# Patient Record
Sex: Male | Born: 1950
Health system: Southern US, Community
[De-identification: ages and names within clinical notes are randomized; demographics above are authoritative.]

## PROBLEM LIST (undated history)

## (undated) DIAGNOSIS — R351 Nocturia: Secondary | ICD-10-CM

## (undated) DIAGNOSIS — N183 Chronic kidney disease, stage 3 unspecified: Secondary | ICD-10-CM

## (undated) DIAGNOSIS — H8110 Benign paroxysmal vertigo, unspecified ear: Secondary | ICD-10-CM

## (undated) DIAGNOSIS — M1990 Unspecified osteoarthritis, unspecified site: Secondary | ICD-10-CM

## (undated) DIAGNOSIS — Z973 Presence of spectacles and contact lenses: Secondary | ICD-10-CM

## (undated) DIAGNOSIS — E559 Vitamin D deficiency, unspecified: Secondary | ICD-10-CM

## (undated) DIAGNOSIS — G4733 Obstructive sleep apnea (adult) (pediatric): Secondary | ICD-10-CM

## (undated) DIAGNOSIS — F329 Major depressive disorder, single episode, unspecified: Secondary | ICD-10-CM

## (undated) DIAGNOSIS — I10 Essential (primary) hypertension: Secondary | ICD-10-CM

## (undated) DIAGNOSIS — C801 Malignant (primary) neoplasm, unspecified: Secondary | ICD-10-CM

## (undated) DIAGNOSIS — K269 Duodenal ulcer, unspecified as acute or chronic, without hemorrhage or perforation: Secondary | ICD-10-CM

## (undated) DIAGNOSIS — K219 Gastro-esophageal reflux disease without esophagitis: Secondary | ICD-10-CM

## (undated) DIAGNOSIS — I209 Angina pectoris, unspecified: Secondary | ICD-10-CM

## (undated) DIAGNOSIS — N189 Chronic kidney disease, unspecified: Secondary | ICD-10-CM

## (undated) DIAGNOSIS — E785 Hyperlipidemia, unspecified: Secondary | ICD-10-CM

## (undated) DIAGNOSIS — C679 Malignant neoplasm of bladder, unspecified: Secondary | ICD-10-CM

## (undated) DIAGNOSIS — E1165 Type 2 diabetes mellitus with hyperglycemia: Secondary | ICD-10-CM

## (undated) DIAGNOSIS — Z87442 Personal history of urinary calculi: Secondary | ICD-10-CM

## (undated) DIAGNOSIS — E7800 Pure hypercholesterolemia, unspecified: Secondary | ICD-10-CM

## (undated) DIAGNOSIS — K449 Diaphragmatic hernia without obstruction or gangrene: Secondary | ICD-10-CM

## (undated) DIAGNOSIS — E119 Type 2 diabetes mellitus without complications: Secondary | ICD-10-CM

## (undated) HISTORY — PX: KNEE ARTHROPLASTY: SHX992

## (undated) HISTORY — DX: Gastro-esophageal reflux disease without esophagitis: K21.9

## (undated) HISTORY — DX: Obstructive sleep apnea (adult) (pediatric): G47.33

## (undated) HISTORY — PX: UPPER GI ENDOSCOPY: SHX6162

## (undated) HISTORY — DX: Type 2 diabetes mellitus with hyperglycemia: E11.65

## (undated) HISTORY — DX: Vitamin D deficiency, unspecified: E55.9

## (undated) HISTORY — DX: Diaphragmatic hernia without obstruction or gangrene: K44.9

## (undated) HISTORY — PX: COLONOSCOPY: SHX174

## (undated) HISTORY — PX: TONSILLECTOMY: SUR1361

## (undated) HISTORY — DX: Duodenal ulcer, unspecified as acute or chronic, without hemorrhage or perforation: K26.9

## (undated) HISTORY — PX: LITHOTRIPSY: SUR834

## (undated) HISTORY — PX: CERVICAL FUSION: SHX112

## (undated) HISTORY — PX: OTHER SURGICAL HISTORY: SHX169

## (undated) HISTORY — PX: BLADDER TUMOR EXCISION: SHX238

## (undated) HISTORY — DX: Benign paroxysmal vertigo, unspecified ear: H81.10

---
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
## 1983-01-11 HISTORY — PX: KNEE ARTHROSCOPY: SUR90

## 2013-09-02 DIAGNOSIS — Z6834 Body mass index (BMI) 34.0-34.9, adult: Secondary | ICD-10-CM

## 2013-09-02 HISTORY — DX: Body mass index (BMI) 34.0-34.9, adult: Z68.34

## 2014-01-10 HISTORY — PX: TOTAL KNEE ARTHROPLASTY: SHX125

## 2014-10-22 DIAGNOSIS — Z79891 Long term (current) use of opiate analgesic: Secondary | ICD-10-CM | POA: Diagnosis not present

## 2014-10-22 DIAGNOSIS — M47812 Spondylosis without myelopathy or radiculopathy, cervical region: Secondary | ICD-10-CM | POA: Diagnosis not present

## 2014-10-22 DIAGNOSIS — F1120 Opioid dependence, uncomplicated: Secondary | ICD-10-CM | POA: Diagnosis not present

## 2014-10-22 DIAGNOSIS — G8929 Other chronic pain: Secondary | ICD-10-CM | POA: Diagnosis not present

## 2014-11-19 DIAGNOSIS — Z79891 Long term (current) use of opiate analgesic: Secondary | ICD-10-CM | POA: Diagnosis not present

## 2014-11-19 DIAGNOSIS — G8929 Other chronic pain: Secondary | ICD-10-CM | POA: Diagnosis not present

## 2014-12-08 DIAGNOSIS — Z1389 Encounter for screening for other disorder: Secondary | ICD-10-CM | POA: Diagnosis not present

## 2014-12-08 DIAGNOSIS — I10 Essential (primary) hypertension: Secondary | ICD-10-CM | POA: Diagnosis not present

## 2014-12-08 DIAGNOSIS — E559 Vitamin D deficiency, unspecified: Secondary | ICD-10-CM | POA: Diagnosis not present

## 2014-12-08 DIAGNOSIS — E1165 Type 2 diabetes mellitus with hyperglycemia: Secondary | ICD-10-CM | POA: Diagnosis not present

## 2014-12-18 DIAGNOSIS — Z79891 Long term (current) use of opiate analgesic: Secondary | ICD-10-CM | POA: Diagnosis not present

## 2014-12-18 DIAGNOSIS — I10 Essential (primary) hypertension: Secondary | ICD-10-CM | POA: Diagnosis not present

## 2014-12-18 DIAGNOSIS — G8929 Other chronic pain: Secondary | ICD-10-CM | POA: Diagnosis not present

## 2014-12-18 DIAGNOSIS — M47812 Spondylosis without myelopathy or radiculopathy, cervical region: Secondary | ICD-10-CM | POA: Diagnosis not present

## 2015-02-04 DIAGNOSIS — E119 Type 2 diabetes mellitus without complications: Secondary | ICD-10-CM | POA: Diagnosis not present

## 2015-02-04 DIAGNOSIS — M179 Osteoarthritis of knee, unspecified: Secondary | ICD-10-CM | POA: Diagnosis not present

## 2015-02-04 DIAGNOSIS — I10 Essential (primary) hypertension: Secondary | ICD-10-CM | POA: Diagnosis not present

## 2015-04-06 DIAGNOSIS — F411 Generalized anxiety disorder: Secondary | ICD-10-CM | POA: Insufficient documentation

## 2015-04-06 DIAGNOSIS — F330 Major depressive disorder, recurrent, mild: Secondary | ICD-10-CM | POA: Insufficient documentation

## 2015-04-06 HISTORY — DX: Generalized anxiety disorder: F41.1

## 2015-04-06 HISTORY — DX: Major depressive disorder, recurrent, mild: F33.0

## 2015-06-05 DIAGNOSIS — A932 Colorado tick fever: Secondary | ICD-10-CM | POA: Diagnosis not present

## 2015-06-05 DIAGNOSIS — E1165 Type 2 diabetes mellitus with hyperglycemia: Secondary | ICD-10-CM | POA: Diagnosis not present

## 2015-07-07 DIAGNOSIS — E1165 Type 2 diabetes mellitus with hyperglycemia: Secondary | ICD-10-CM | POA: Diagnosis not present

## 2015-07-15 DIAGNOSIS — E1165 Type 2 diabetes mellitus with hyperglycemia: Secondary | ICD-10-CM | POA: Diagnosis not present

## 2015-07-15 DIAGNOSIS — Z0000 Encounter for general adult medical examination without abnormal findings: Secondary | ICD-10-CM | POA: Diagnosis not present

## 2015-09-09 DIAGNOSIS — M546 Pain in thoracic spine: Secondary | ICD-10-CM | POA: Diagnosis not present

## 2015-09-09 DIAGNOSIS — E559 Vitamin D deficiency, unspecified: Secondary | ICD-10-CM | POA: Diagnosis not present

## 2015-09-09 DIAGNOSIS — I10 Essential (primary) hypertension: Secondary | ICD-10-CM | POA: Diagnosis not present

## 2015-09-09 DIAGNOSIS — M5412 Radiculopathy, cervical region: Secondary | ICD-10-CM | POA: Diagnosis not present

## 2015-09-09 DIAGNOSIS — G4700 Insomnia, unspecified: Secondary | ICD-10-CM | POA: Diagnosis not present

## 2015-09-09 DIAGNOSIS — M542 Cervicalgia: Secondary | ICD-10-CM | POA: Diagnosis not present

## 2015-09-09 DIAGNOSIS — E7800 Pure hypercholesterolemia, unspecified: Secondary | ICD-10-CM | POA: Diagnosis not present

## 2015-09-09 DIAGNOSIS — E1165 Type 2 diabetes mellitus with hyperglycemia: Secondary | ICD-10-CM | POA: Diagnosis not present

## 2015-09-17 DIAGNOSIS — S61232A Puncture wound without foreign body of right middle finger without damage to nail, initial encounter: Secondary | ICD-10-CM | POA: Diagnosis not present

## 2015-10-15 DIAGNOSIS — M5412 Radiculopathy, cervical region: Secondary | ICD-10-CM | POA: Diagnosis not present

## 2015-10-19 DIAGNOSIS — Z125 Encounter for screening for malignant neoplasm of prostate: Secondary | ICD-10-CM | POA: Diagnosis not present

## 2015-10-19 DIAGNOSIS — E1165 Type 2 diabetes mellitus with hyperglycemia: Secondary | ICD-10-CM | POA: Diagnosis not present

## 2015-10-20 ENCOUNTER — Other Ambulatory Visit: Payer: Self-pay | Admitting: Neurosurgery

## 2015-10-20 DIAGNOSIS — M4802 Spinal stenosis, cervical region: Secondary | ICD-10-CM | POA: Diagnosis not present

## 2015-10-20 DIAGNOSIS — G5603 Carpal tunnel syndrome, bilateral upper limbs: Secondary | ICD-10-CM | POA: Diagnosis not present

## 2015-10-20 DIAGNOSIS — M5412 Radiculopathy, cervical region: Secondary | ICD-10-CM

## 2015-11-03 DIAGNOSIS — I10 Essential (primary) hypertension: Secondary | ICD-10-CM | POA: Diagnosis not present

## 2015-11-03 DIAGNOSIS — E782 Mixed hyperlipidemia: Secondary | ICD-10-CM | POA: Diagnosis not present

## 2015-11-03 DIAGNOSIS — G4700 Insomnia, unspecified: Secondary | ICD-10-CM | POA: Diagnosis not present

## 2015-11-03 DIAGNOSIS — E559 Vitamin D deficiency, unspecified: Secondary | ICD-10-CM | POA: Diagnosis not present

## 2015-11-03 DIAGNOSIS — E1165 Type 2 diabetes mellitus with hyperglycemia: Secondary | ICD-10-CM | POA: Diagnosis not present

## 2015-11-04 ENCOUNTER — Ambulatory Visit
Admission: RE | Admit: 2015-11-04 | Discharge: 2015-11-04 | Disposition: A | Discharge status: Home / Self Care | Payer: Medicare Other | Source: Ambulatory Visit | Attending: Neurosurgery | Admitting: Neurosurgery

## 2015-11-04 DIAGNOSIS — M4802 Spinal stenosis, cervical region: Secondary | ICD-10-CM | POA: Diagnosis not present

## 2015-11-04 DIAGNOSIS — M5412 Radiculopathy, cervical region: Secondary | ICD-10-CM

## 2015-11-04 MED ORDER — ONDANSETRON HCL 4 MG/2ML IJ SOLN: 4.0000 mg | Freq: Four times a day (QID) | INTRAMUSCULAR | Status: DC | PRN

## 2015-11-04 MED ORDER — IOPAMIDOL (ISOVUE-M 300) INJECTION 61%
10.0000 mL | Freq: Once | INTRAMUSCULAR | Status: AC | PRN
  Administered 2015-11-04: 10 mL via INTRATHECAL

## 2015-11-04 MED ORDER — DIAZEPAM 5 MG PO TABS
10.0000 mg | ORAL_TABLET | Freq: Once | ORAL | Status: AC
  Administered 2015-11-04: 10 mg via ORAL

## 2015-11-04 NOTE — Discharge Instructions (Signed)

## 2015-11-06 DIAGNOSIS — M5412 Radiculopathy, cervical region: Secondary | ICD-10-CM | POA: Diagnosis not present

## 2015-11-11 ENCOUNTER — Other Ambulatory Visit: Payer: Self-pay | Admitting: Neurosurgery

## 2015-11-11 DIAGNOSIS — M5412 Radiculopathy, cervical region: Secondary | ICD-10-CM

## 2015-11-18 ENCOUNTER — Other Ambulatory Visit: Payer: Medicare Other

## 2015-11-19 ENCOUNTER — Ambulatory Visit
Admission: RE | Admit: 2015-11-19 | Discharge: 2015-11-19 | Disposition: A | Discharge status: Home / Self Care | Payer: Medicare Other | Source: Ambulatory Visit | Attending: Neurosurgery | Admitting: Neurosurgery

## 2015-11-19 DIAGNOSIS — M5412 Radiculopathy, cervical region: Secondary | ICD-10-CM | POA: Diagnosis not present

## 2015-11-19 MED ORDER — DEXAMETHASONE SODIUM PHOSPHATE 4 MG/ML IJ SOLN
4.0000 mg | Freq: Once | INTRAMUSCULAR | Status: AC
  Administered 2015-11-19: 4 mg via INTRA_ARTICULAR

## 2015-11-19 MED ORDER — IOPAMIDOL (ISOVUE-M 300) INJECTION 61%
1.0000 mL | Freq: Once | INTRAMUSCULAR | Status: AC | PRN
  Administered 2015-11-19: 1 mL via INTRA_ARTICULAR

## 2015-11-19 NOTE — Discharge Instructions (Signed)

## 2015-12-07 DIAGNOSIS — M4802 Spinal stenosis, cervical region: Secondary | ICD-10-CM | POA: Diagnosis not present

## 2015-12-07 DIAGNOSIS — I10 Essential (primary) hypertension: Secondary | ICD-10-CM | POA: Diagnosis not present

## 2015-12-09 ENCOUNTER — Other Ambulatory Visit: Payer: Self-pay | Admitting: Neurosurgery

## 2015-12-09 DIAGNOSIS — M4802 Spinal stenosis, cervical region: Secondary | ICD-10-CM

## 2016-01-11 HISTORY — PX: COLONOSCOPY: SHX174

## 2016-02-22 DIAGNOSIS — M4722 Other spondylosis with radiculopathy, cervical region: Secondary | ICD-10-CM | POA: Diagnosis not present

## 2016-02-22 DIAGNOSIS — I10 Essential (primary) hypertension: Secondary | ICD-10-CM | POA: Diagnosis not present

## 2016-02-22 DIAGNOSIS — S129XXA Fracture of neck, unspecified, initial encounter: Secondary | ICD-10-CM | POA: Diagnosis not present

## 2016-04-13 DIAGNOSIS — H8113 Benign paroxysmal vertigo, bilateral: Secondary | ICD-10-CM | POA: Diagnosis not present

## 2016-04-13 DIAGNOSIS — R110 Nausea: Secondary | ICD-10-CM | POA: Diagnosis not present

## 2016-04-13 DIAGNOSIS — E1165 Type 2 diabetes mellitus with hyperglycemia: Secondary | ICD-10-CM | POA: Diagnosis not present

## 2016-04-18 DIAGNOSIS — H8113 Benign paroxysmal vertigo, bilateral: Secondary | ICD-10-CM | POA: Diagnosis not present

## 2016-04-22 DIAGNOSIS — Z0000 Encounter for general adult medical examination without abnormal findings: Secondary | ICD-10-CM | POA: Diagnosis not present

## 2016-06-14 DIAGNOSIS — S129XXD Fracture of neck, unspecified, subsequent encounter: Secondary | ICD-10-CM | POA: Diagnosis not present

## 2016-06-14 DIAGNOSIS — M4722 Other spondylosis with radiculopathy, cervical region: Secondary | ICD-10-CM | POA: Diagnosis not present

## 2016-06-14 DIAGNOSIS — I10 Essential (primary) hypertension: Secondary | ICD-10-CM | POA: Diagnosis not present

## 2016-07-18 DIAGNOSIS — M5030 Other cervical disc degeneration, unspecified cervical region: Secondary | ICD-10-CM | POA: Diagnosis not present

## 2016-08-09 DIAGNOSIS — I10 Essential (primary) hypertension: Secondary | ICD-10-CM | POA: Diagnosis not present

## 2016-08-09 DIAGNOSIS — E559 Vitamin D deficiency, unspecified: Secondary | ICD-10-CM | POA: Diagnosis not present

## 2016-08-09 DIAGNOSIS — M5030 Other cervical disc degeneration, unspecified cervical region: Secondary | ICD-10-CM | POA: Diagnosis not present

## 2016-10-13 DIAGNOSIS — Z5181 Encounter for therapeutic drug level monitoring: Secondary | ICD-10-CM

## 2016-10-13 DIAGNOSIS — Z79899 Other long term (current) drug therapy: Secondary | ICD-10-CM

## 2016-10-13 DIAGNOSIS — M5416 Radiculopathy, lumbar region: Secondary | ICD-10-CM | POA: Diagnosis not present

## 2016-10-13 DIAGNOSIS — G894 Chronic pain syndrome: Secondary | ICD-10-CM | POA: Insufficient documentation

## 2016-10-13 DIAGNOSIS — M5481 Occipital neuralgia: Secondary | ICD-10-CM

## 2016-10-13 DIAGNOSIS — M4802 Spinal stenosis, cervical region: Secondary | ICD-10-CM

## 2016-10-13 DIAGNOSIS — M1710 Unilateral primary osteoarthritis, unspecified knee: Secondary | ICD-10-CM

## 2016-10-13 DIAGNOSIS — M7061 Trochanteric bursitis, right hip: Secondary | ICD-10-CM

## 2016-10-13 DIAGNOSIS — Z96651 Presence of right artificial knee joint: Secondary | ICD-10-CM

## 2016-10-13 DIAGNOSIS — M961 Postlaminectomy syndrome, not elsewhere classified: Secondary | ICD-10-CM | POA: Insufficient documentation

## 2016-10-13 DIAGNOSIS — M1711 Unilateral primary osteoarthritis, right knee: Secondary | ICD-10-CM | POA: Insufficient documentation

## 2016-10-13 HISTORY — DX: Unilateral primary osteoarthritis, right knee: M17.11

## 2016-10-13 HISTORY — DX: Postlaminectomy syndrome, not elsewhere classified: M96.1

## 2016-10-13 HISTORY — DX: Chronic pain syndrome: G89.4

## 2016-10-13 HISTORY — DX: Encounter for therapeutic drug level monitoring: Z51.81

## 2016-10-13 HISTORY — DX: Other long term (current) drug therapy: Z79.899

## 2016-10-13 HISTORY — DX: Trochanteric bursitis, right hip: M70.61

## 2016-10-13 HISTORY — DX: Unilateral primary osteoarthritis, unspecified knee: M17.10

## 2016-10-13 HISTORY — DX: Presence of right artificial knee joint: Z96.651

## 2016-10-13 HISTORY — DX: Occipital neuralgia: M54.81

## 2016-10-13 HISTORY — DX: Spinal stenosis, cervical region: M48.02

## 2016-11-04 DIAGNOSIS — E1165 Type 2 diabetes mellitus with hyperglycemia: Secondary | ICD-10-CM | POA: Diagnosis not present

## 2016-11-04 DIAGNOSIS — D510 Vitamin B12 deficiency anemia due to intrinsic factor deficiency: Secondary | ICD-10-CM | POA: Diagnosis not present

## 2016-11-04 DIAGNOSIS — E782 Mixed hyperlipidemia: Secondary | ICD-10-CM | POA: Diagnosis not present

## 2016-11-04 DIAGNOSIS — Z79899 Other long term (current) drug therapy: Secondary | ICD-10-CM | POA: Diagnosis not present

## 2016-11-04 DIAGNOSIS — Z1159 Encounter for screening for other viral diseases: Secondary | ICD-10-CM | POA: Diagnosis not present

## 2016-11-04 DIAGNOSIS — E559 Vitamin D deficiency, unspecified: Secondary | ICD-10-CM | POA: Diagnosis not present

## 2016-11-04 DIAGNOSIS — M5030 Other cervical disc degeneration, unspecified cervical region: Secondary | ICD-10-CM | POA: Diagnosis not present

## 2016-11-04 DIAGNOSIS — I10 Essential (primary) hypertension: Secondary | ICD-10-CM | POA: Diagnosis not present

## 2016-11-15 DIAGNOSIS — M961 Postlaminectomy syndrome, not elsewhere classified: Secondary | ICD-10-CM | POA: Diagnosis not present

## 2016-11-15 DIAGNOSIS — M5481 Occipital neuralgia: Secondary | ICD-10-CM | POA: Diagnosis not present

## 2016-11-15 DIAGNOSIS — M4802 Spinal stenosis, cervical region: Secondary | ICD-10-CM | POA: Diagnosis not present

## 2016-11-16 DIAGNOSIS — M5481 Occipital neuralgia: Secondary | ICD-10-CM | POA: Diagnosis not present

## 2016-12-14 DIAGNOSIS — M47812 Spondylosis without myelopathy or radiculopathy, cervical region: Secondary | ICD-10-CM | POA: Insufficient documentation

## 2016-12-14 DIAGNOSIS — M961 Postlaminectomy syndrome, not elsewhere classified: Secondary | ICD-10-CM | POA: Diagnosis not present

## 2016-12-14 DIAGNOSIS — M4802 Spinal stenosis, cervical region: Secondary | ICD-10-CM | POA: Diagnosis not present

## 2016-12-14 DIAGNOSIS — M5481 Occipital neuralgia: Secondary | ICD-10-CM | POA: Diagnosis not present

## 2016-12-14 HISTORY — DX: Spondylosis without myelopathy or radiculopathy, cervical region: M47.812

## 2016-12-15 DIAGNOSIS — R110 Nausea: Secondary | ICD-10-CM | POA: Diagnosis not present

## 2016-12-15 DIAGNOSIS — Z1339 Encounter for screening examination for other mental health and behavioral disorders: Secondary | ICD-10-CM | POA: Diagnosis not present

## 2016-12-15 DIAGNOSIS — E782 Mixed hyperlipidemia: Secondary | ICD-10-CM | POA: Diagnosis not present

## 2016-12-15 DIAGNOSIS — I10 Essential (primary) hypertension: Secondary | ICD-10-CM | POA: Diagnosis not present

## 2016-12-15 DIAGNOSIS — K219 Gastro-esophageal reflux disease without esophagitis: Secondary | ICD-10-CM | POA: Diagnosis not present

## 2016-12-28 DIAGNOSIS — R1310 Dysphagia, unspecified: Secondary | ICD-10-CM | POA: Diagnosis not present

## 2017-01-30 DIAGNOSIS — R5383 Other fatigue: Secondary | ICD-10-CM | POA: Diagnosis not present

## 2017-01-30 DIAGNOSIS — G4733 Obstructive sleep apnea (adult) (pediatric): Secondary | ICD-10-CM | POA: Diagnosis not present

## 2017-02-07 DIAGNOSIS — S129XXS Fracture of neck, unspecified, sequela: Secondary | ICD-10-CM

## 2017-02-07 DIAGNOSIS — M5031 Other cervical disc degeneration,  high cervical region: Secondary | ICD-10-CM | POA: Diagnosis not present

## 2017-02-07 DIAGNOSIS — M4802 Spinal stenosis, cervical region: Secondary | ICD-10-CM | POA: Diagnosis not present

## 2017-02-07 HISTORY — DX: Fracture of neck, unspecified, sequela: S12.9XXS

## 2017-02-08 DIAGNOSIS — M5481 Occipital neuralgia: Secondary | ICD-10-CM | POA: Diagnosis not present

## 2017-02-08 DIAGNOSIS — G894 Chronic pain syndrome: Secondary | ICD-10-CM | POA: Diagnosis not present

## 2017-02-08 DIAGNOSIS — M4802 Spinal stenosis, cervical region: Secondary | ICD-10-CM | POA: Diagnosis not present

## 2017-02-08 DIAGNOSIS — M961 Postlaminectomy syndrome, not elsewhere classified: Secondary | ICD-10-CM | POA: Diagnosis not present

## 2017-02-21 DIAGNOSIS — G4733 Obstructive sleep apnea (adult) (pediatric): Secondary | ICD-10-CM | POA: Diagnosis not present

## 2017-02-27 DIAGNOSIS — G4733 Obstructive sleep apnea (adult) (pediatric): Secondary | ICD-10-CM | POA: Diagnosis not present

## 2017-02-27 DIAGNOSIS — Z87891 Personal history of nicotine dependence: Secondary | ICD-10-CM | POA: Diagnosis not present

## 2017-03-28 DIAGNOSIS — G4733 Obstructive sleep apnea (adult) (pediatric): Secondary | ICD-10-CM | POA: Diagnosis not present

## 2017-04-03 DIAGNOSIS — R5383 Other fatigue: Secondary | ICD-10-CM | POA: Diagnosis not present

## 2017-04-03 DIAGNOSIS — G4733 Obstructive sleep apnea (adult) (pediatric): Secondary | ICD-10-CM | POA: Diagnosis not present

## 2017-04-03 DIAGNOSIS — J0100 Acute maxillary sinusitis, unspecified: Secondary | ICD-10-CM | POA: Diagnosis not present

## 2017-04-03 DIAGNOSIS — R05 Cough: Secondary | ICD-10-CM | POA: Diagnosis not present

## 2017-04-12 DIAGNOSIS — M5481 Occipital neuralgia: Secondary | ICD-10-CM | POA: Diagnosis not present

## 2017-04-12 DIAGNOSIS — M4802 Spinal stenosis, cervical region: Secondary | ICD-10-CM | POA: Diagnosis not present

## 2017-04-12 DIAGNOSIS — M961 Postlaminectomy syndrome, not elsewhere classified: Secondary | ICD-10-CM | POA: Diagnosis not present

## 2017-04-12 DIAGNOSIS — G894 Chronic pain syndrome: Secondary | ICD-10-CM | POA: Diagnosis not present

## 2017-04-12 DIAGNOSIS — Z79891 Long term (current) use of opiate analgesic: Secondary | ICD-10-CM | POA: Diagnosis not present

## 2017-04-13 DIAGNOSIS — G4733 Obstructive sleep apnea (adult) (pediatric): Secondary | ICD-10-CM | POA: Diagnosis not present

## 2017-04-24 DIAGNOSIS — S9001XD Contusion of right ankle, subsequent encounter: Secondary | ICD-10-CM | POA: Diagnosis not present

## 2017-05-01 DIAGNOSIS — E559 Vitamin D deficiency, unspecified: Secondary | ICD-10-CM | POA: Diagnosis not present

## 2017-05-01 DIAGNOSIS — E1165 Type 2 diabetes mellitus with hyperglycemia: Secondary | ICD-10-CM | POA: Diagnosis not present

## 2017-05-04 DIAGNOSIS — Z0000 Encounter for general adult medical examination without abnormal findings: Secondary | ICD-10-CM | POA: Diagnosis not present

## 2017-05-04 DIAGNOSIS — E782 Mixed hyperlipidemia: Secondary | ICD-10-CM | POA: Diagnosis not present

## 2017-05-04 DIAGNOSIS — I10 Essential (primary) hypertension: Secondary | ICD-10-CM | POA: Diagnosis not present

## 2017-05-04 DIAGNOSIS — E1165 Type 2 diabetes mellitus with hyperglycemia: Secondary | ICD-10-CM | POA: Diagnosis not present

## 2017-05-04 DIAGNOSIS — K219 Gastro-esophageal reflux disease without esophagitis: Secondary | ICD-10-CM | POA: Diagnosis not present

## 2017-05-08 DIAGNOSIS — G4733 Obstructive sleep apnea (adult) (pediatric): Secondary | ICD-10-CM | POA: Diagnosis not present

## 2017-05-13 DIAGNOSIS — G4733 Obstructive sleep apnea (adult) (pediatric): Secondary | ICD-10-CM | POA: Diagnosis not present

## 2017-05-17 DIAGNOSIS — M545 Low back pain: Secondary | ICD-10-CM | POA: Diagnosis not present

## 2017-05-17 DIAGNOSIS — R0781 Pleurodynia: Secondary | ICD-10-CM | POA: Diagnosis not present

## 2017-05-18 DIAGNOSIS — M5481 Occipital neuralgia: Secondary | ICD-10-CM | POA: Diagnosis not present

## 2017-05-18 DIAGNOSIS — M961 Postlaminectomy syndrome, not elsewhere classified: Secondary | ICD-10-CM | POA: Diagnosis not present

## 2017-05-18 DIAGNOSIS — G894 Chronic pain syndrome: Secondary | ICD-10-CM | POA: Diagnosis not present

## 2017-05-29 IMAGING — CT CT CERVICAL SPINE W/ CM
3 of 4 series · 11 of 33 positions shown, 13 images · non-contrast
Comparison: Cervical spine radiographs 09/09/2015

CLINICAL DATA: Neck pain. Shoulder pain, predominantly on the left.
Prior cervical fusion.
TECHNIQUE: Contiguous axial images were obtained through the Cervical spine
after the intrathecal infusion of infusion. Coronal and sagittal
reconstructions were obtained of the axial image sets.

[Series 6: cor · coronal · 0.22mm/px · 3 of 58 slices shown]
[im 12/58  bone]
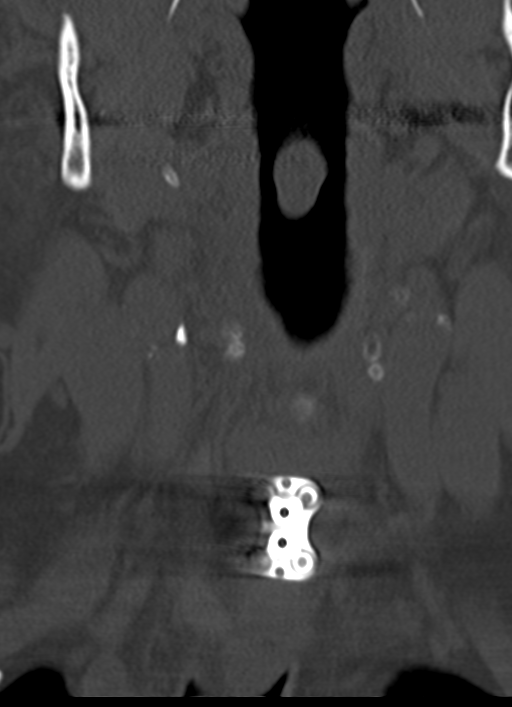
[im 23/58  bone]
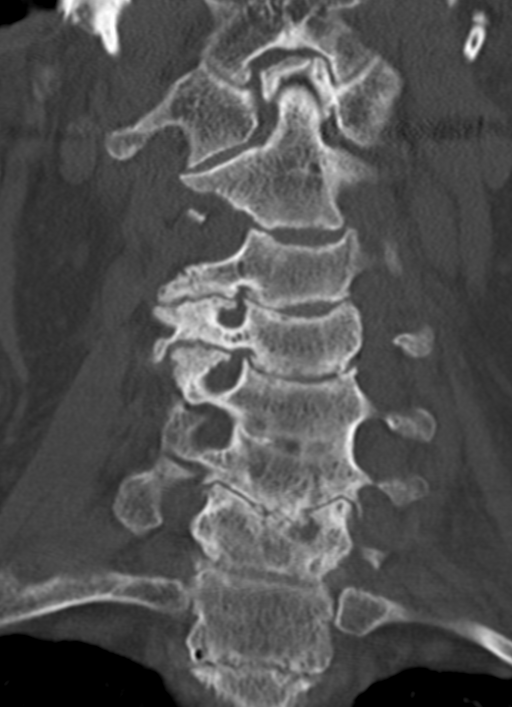
[im 35/58  bone]
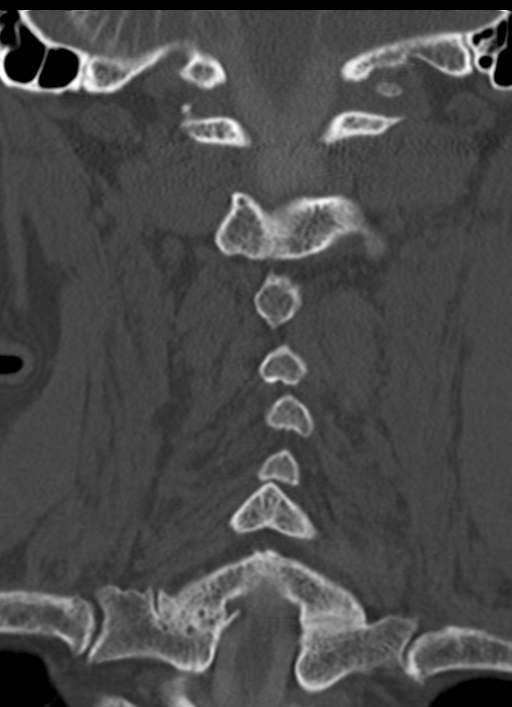

[Series 7: sag · sagittal · 0.23mm/px · 5 of 61 slices shown, 6 images]
[im 21/61  bone]
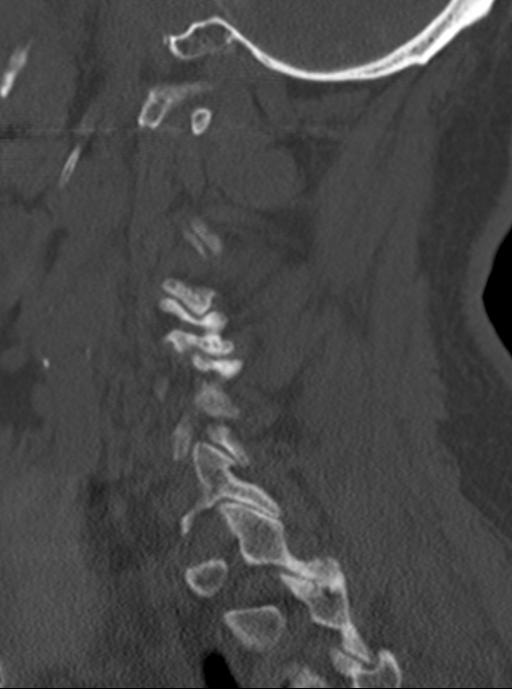
[im 26/61  bone]
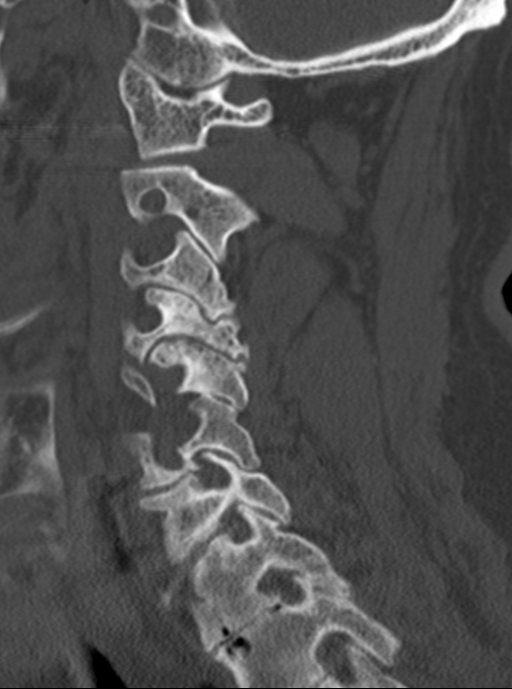
[im 31/61  soft-tissue]
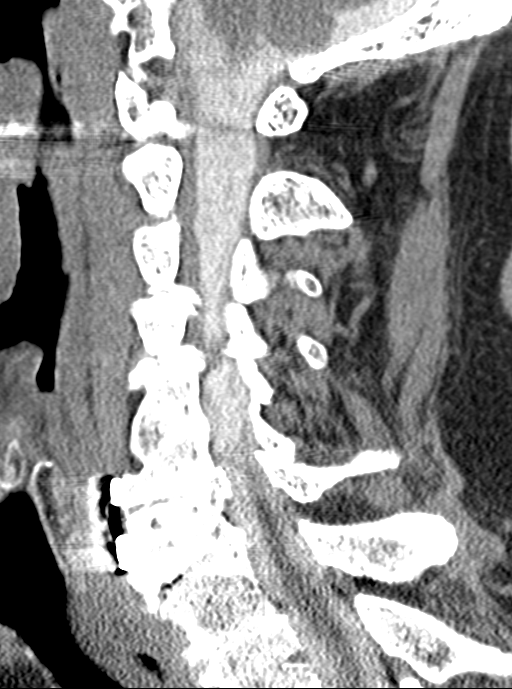
[im 31/61  bone]
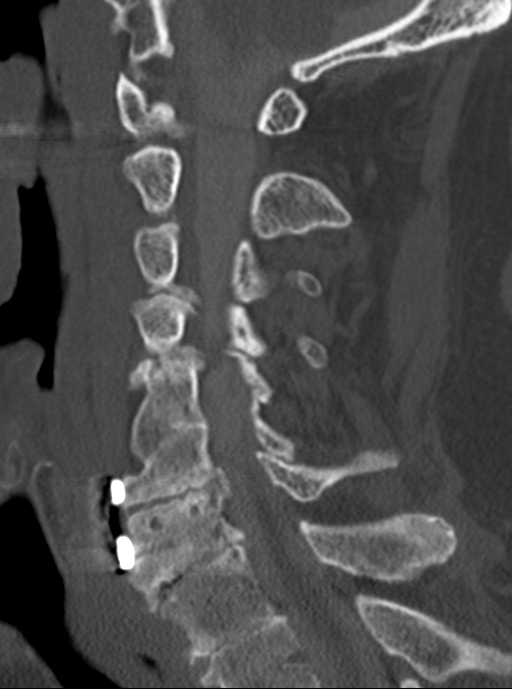
[im 36/61  bone]
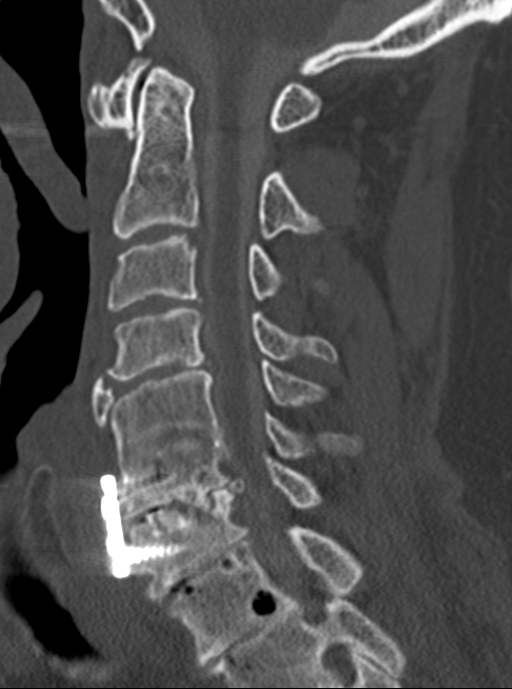
[im 41/61  bone]
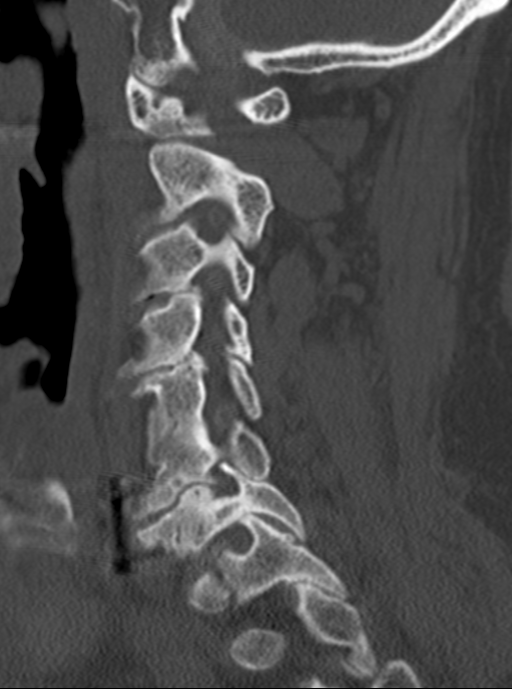

[Series 8: angled axial · axial · 0.23mm/px · z∈[-530,-422]mm · 3 of 82 slices shown, 4 images]
[im 14/82  soft-tissue]
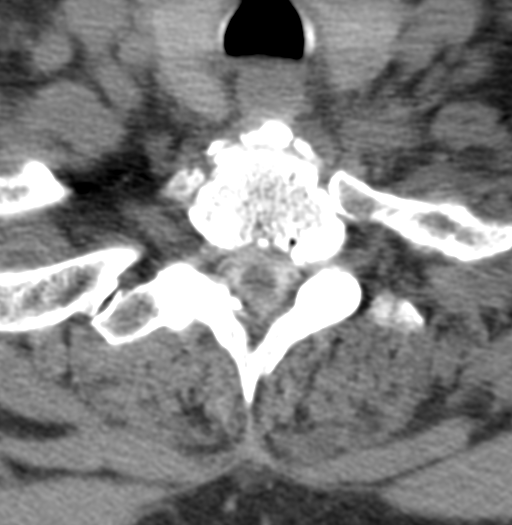
[im 14/82  bone]
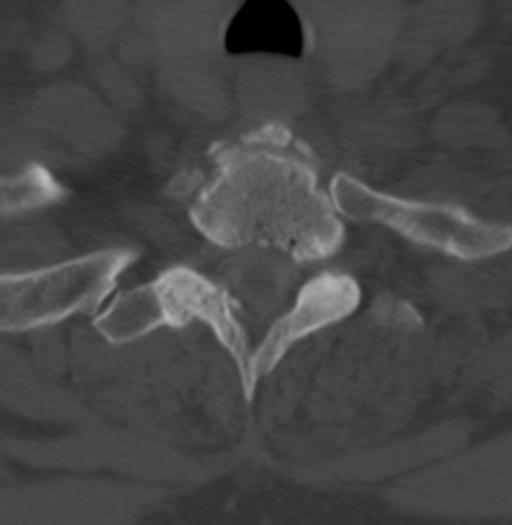
[im 41/82  bone]
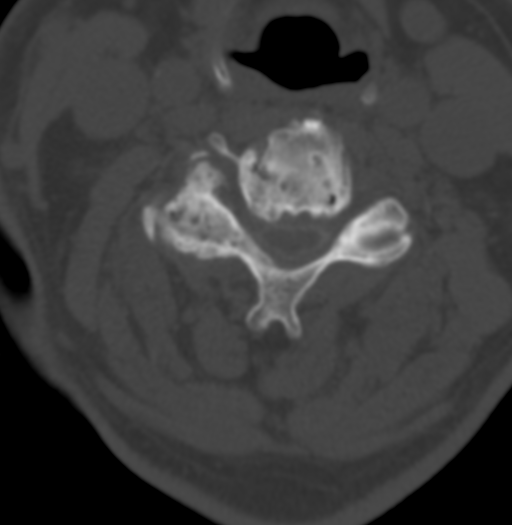
[im 68/82  bone]
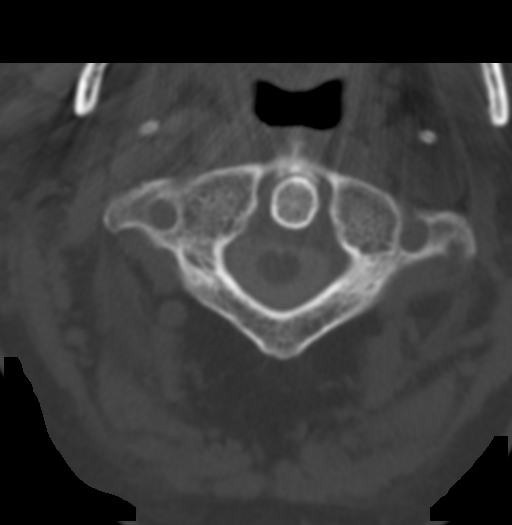

[11 of 33 positions shown; findings below may reference images not displayed]

FLUOROSCOPY TIME:  Radiation Exposure Index (as provided by the
fluoroscopic device): 205.40 microGray*m^2

Fluoroscopy Time (in minutes and seconds):  1 minute 16 seconds

PROCEDURE:
LUMBAR PUNCTURE FOR CERVICAL MYELOGRAM

After thorough discussion of risks and benefits of the procedure
including bleeding, infection, injury to nerves, blood vessels,
adjacent structures as well as headache and CSF leak, written and
oral informed consent was obtained. Consent was obtained by Dr.
Tuong Mcclellan. We discussed the high likelihood of obtaining a
diagnostic study.

Patient was positioned prone on the fluoroscopy table. Local
anesthesia was provided with 1% lidocaine without epinephrine after
prepped and draped in the usual sterile fashion. Puncture was
performed at L3-4 using a 3 1/2 inch 22-gauge spinal needle via a
left paramedian approach. Using a single pass through the dura, the
needle was placed within the thecal sac, with return of clear CSF.
10 mL of Isovue M 300 was injected into the thecal sac, with normal
opacification of the nerve roots and cauda equina consistent with
free flow within the subarachnoid space. The patient was then moved
to the trendelenburg position and contrast flowed into the Cervical
spine region.

I personally performed the lumbar puncture and administered the
intrathecal contrast. I also personally supervised acquisition of
the myelogram images.
FINDINGS: CERVICAL MYELOGRAM FINDINGS:

There is a 3 mm retrolisthesis of C2 on C3 in the upright neutral
position which completely reduces with flexion and at most minimally
increases with extension. Solid C5-6 intervertebral osseous fusion.
Prior C6-7 ACDF with anterior plate and screws in place. Intrathecal
contrast is too faint on the upright images to evaluate for
stenosis. On prone images, lateral extradural defects affect the
bilateral C5 nerve roots as well as C4 on the right. Ventral
extradural defects result in likely mild spinal stenosis at C3-4 and
mild-to-moderate stenosis at C4-5.

CT CERVICAL MYELOGRAM FINDINGS:

There is trace retrolisthesis of C2 on C3, less conspicuous than on
upright radiographs. There is also trace retrolisthesis of C6 on C7.
Solid intervertebral osseous fusion is present at C5-6. C6-7 ACDF is
noted with anterior plate and screws remaining in place. No osseous
fusion is identified across the C6-7 disc space, and there is
subsidence of the interbody graft into the C7 superior endplate with
vacuum disc phenomenon noted. There is mild disc space narrowing at
C3-4, mild-to-moderate narrowing at C4-5, and severe narrowing at
C7-T1 with mild vacuum disc at each level. Mild bilateral carotid
bifurcation atherosclerosis is noted.

C2-3: Minimal disc bulging and minimal left uncovertebral spurring
without stenosis.

C3-4: Broad-based posterior disc osteophyte complex, asymmetric
right uncovertebral spurring, and severe right facet arthrosis
result in mild spinal stenosis with slight ventral cord flattening
and moderate right and mild left neural foraminal stenosis.

C4-5: Broad-based posterior disc osteophyte complex and severe right
and mild left facet arthrosis result in moderate spinal stenosis
with mild ventral cord flattening and moderate right and
mild-to-moderate left neural foraminal stenosis.

C5-6:  Prior fusion.  No stenosis.

C6-7: Diffuse bulky posterior spurring and uncovertebral hypertrophy
result in moderate asymmetric left-sided spinal stenosis with mild
cord flattening and mild to moderate right and severe left neural
foraminal stenosis. Potential left C7 nerve impingement.

C7-T1: Broad-based posterior disc osteophyte complex results in mild
left neural foraminal stenosis without spinal stenosis.
IMPRESSION: 1. Prior C6-7 ACDF with evidence of pseudarthrosis. Bulky spurring
results in moderate left-sided spinal stenosis and mild-to-moderate
right and severe left neural foraminal stenosis.
2. Moderate spinal stenosis and moderate right and mild-to-moderate
left foraminal stenosis at C4-5.
3. Mild spinal stenosis and moderate right and mild left foraminal
stenosis at C3-4.
4. Severe right facet arthrosis at C3-4 and C4-5.
5. Remote, solid C5-6 interbody fusion without stenosis.
6. Grade 1 retrolisthesis of C2 on C3 which reduces with flexion.

## 2017-05-30 DIAGNOSIS — S8011XD Contusion of right lower leg, subsequent encounter: Secondary | ICD-10-CM | POA: Diagnosis not present

## 2017-05-30 DIAGNOSIS — R600 Localized edema: Secondary | ICD-10-CM | POA: Diagnosis not present

## 2017-05-30 DIAGNOSIS — M7989 Other specified soft tissue disorders: Secondary | ICD-10-CM | POA: Diagnosis not present

## 2017-06-09 DIAGNOSIS — S129XXD Fracture of neck, unspecified, subsequent encounter: Secondary | ICD-10-CM | POA: Diagnosis not present

## 2017-06-13 DIAGNOSIS — G4733 Obstructive sleep apnea (adult) (pediatric): Secondary | ICD-10-CM | POA: Diagnosis not present

## 2017-06-14 DIAGNOSIS — E1165 Type 2 diabetes mellitus with hyperglycemia: Secondary | ICD-10-CM | POA: Diagnosis not present

## 2017-06-14 DIAGNOSIS — G4733 Obstructive sleep apnea (adult) (pediatric): Secondary | ICD-10-CM | POA: Diagnosis not present

## 2017-06-14 DIAGNOSIS — R490 Dysphonia: Secondary | ICD-10-CM | POA: Diagnosis not present

## 2017-06-14 DIAGNOSIS — S8011XD Contusion of right lower leg, subsequent encounter: Secondary | ICD-10-CM | POA: Diagnosis not present

## 2017-06-20 DIAGNOSIS — G4733 Obstructive sleep apnea (adult) (pediatric): Secondary | ICD-10-CM | POA: Diagnosis not present

## 2017-06-21 DIAGNOSIS — G4733 Obstructive sleep apnea (adult) (pediatric): Secondary | ICD-10-CM | POA: Diagnosis not present

## 2017-06-21 DIAGNOSIS — R5383 Other fatigue: Secondary | ICD-10-CM | POA: Diagnosis not present

## 2017-07-03 DIAGNOSIS — L255 Unspecified contact dermatitis due to plants, except food: Secondary | ICD-10-CM | POA: Diagnosis not present

## 2017-07-04 DIAGNOSIS — M961 Postlaminectomy syndrome, not elsewhere classified: Secondary | ICD-10-CM | POA: Diagnosis not present

## 2017-07-04 DIAGNOSIS — M4802 Spinal stenosis, cervical region: Secondary | ICD-10-CM | POA: Diagnosis not present

## 2017-07-04 DIAGNOSIS — S129XXS Fracture of neck, unspecified, sequela: Secondary | ICD-10-CM | POA: Diagnosis not present

## 2017-07-13 DIAGNOSIS — G4733 Obstructive sleep apnea (adult) (pediatric): Secondary | ICD-10-CM | POA: Diagnosis not present

## 2017-07-16 DIAGNOSIS — B370 Candidal stomatitis: Secondary | ICD-10-CM | POA: Diagnosis not present

## 2017-07-16 DIAGNOSIS — R1310 Dysphagia, unspecified: Secondary | ICD-10-CM | POA: Diagnosis not present

## 2017-07-16 DIAGNOSIS — E119 Type 2 diabetes mellitus without complications: Secondary | ICD-10-CM | POA: Diagnosis not present

## 2017-07-16 DIAGNOSIS — I10 Essential (primary) hypertension: Secondary | ICD-10-CM | POA: Diagnosis not present

## 2017-07-16 DIAGNOSIS — Z87891 Personal history of nicotine dependence: Secondary | ICD-10-CM | POA: Diagnosis not present

## 2017-07-16 DIAGNOSIS — M542 Cervicalgia: Secondary | ICD-10-CM | POA: Diagnosis not present

## 2017-07-28 DIAGNOSIS — R233 Spontaneous ecchymoses: Secondary | ICD-10-CM | POA: Diagnosis not present

## 2017-07-28 DIAGNOSIS — E1165 Type 2 diabetes mellitus with hyperglycemia: Secondary | ICD-10-CM | POA: Diagnosis not present

## 2017-08-08 DIAGNOSIS — J0100 Acute maxillary sinusitis, unspecified: Secondary | ICD-10-CM | POA: Diagnosis not present

## 2017-08-08 DIAGNOSIS — J209 Acute bronchitis, unspecified: Secondary | ICD-10-CM | POA: Diagnosis not present

## 2017-08-15 DIAGNOSIS — J329 Chronic sinusitis, unspecified: Secondary | ICD-10-CM | POA: Diagnosis not present

## 2017-08-15 DIAGNOSIS — J40 Bronchitis, not specified as acute or chronic: Secondary | ICD-10-CM | POA: Diagnosis not present

## 2017-08-28 DIAGNOSIS — H524 Presbyopia: Secondary | ICD-10-CM | POA: Diagnosis not present

## 2017-08-28 DIAGNOSIS — H2513 Age-related nuclear cataract, bilateral: Secondary | ICD-10-CM | POA: Diagnosis not present

## 2017-08-28 DIAGNOSIS — E119 Type 2 diabetes mellitus without complications: Secondary | ICD-10-CM | POA: Diagnosis not present

## 2017-09-04 DIAGNOSIS — E782 Mixed hyperlipidemia: Secondary | ICD-10-CM | POA: Diagnosis not present

## 2017-09-04 DIAGNOSIS — Z79899 Other long term (current) drug therapy: Secondary | ICD-10-CM | POA: Diagnosis not present

## 2017-09-04 DIAGNOSIS — E559 Vitamin D deficiency, unspecified: Secondary | ICD-10-CM | POA: Diagnosis not present

## 2017-09-04 DIAGNOSIS — E1165 Type 2 diabetes mellitus with hyperglycemia: Secondary | ICD-10-CM | POA: Diagnosis not present

## 2017-09-04 DIAGNOSIS — Z125 Encounter for screening for malignant neoplasm of prostate: Secondary | ICD-10-CM | POA: Diagnosis not present

## 2017-09-08 DIAGNOSIS — M47812 Spondylosis without myelopathy or radiculopathy, cervical region: Secondary | ICD-10-CM | POA: Diagnosis not present

## 2017-09-08 DIAGNOSIS — S129XXS Fracture of neck, unspecified, sequela: Secondary | ICD-10-CM | POA: Diagnosis not present

## 2017-09-08 DIAGNOSIS — M961 Postlaminectomy syndrome, not elsewhere classified: Secondary | ICD-10-CM | POA: Diagnosis not present

## 2017-09-13 DIAGNOSIS — G4733 Obstructive sleep apnea (adult) (pediatric): Secondary | ICD-10-CM | POA: Diagnosis not present

## 2017-09-25 DIAGNOSIS — G4733 Obstructive sleep apnea (adult) (pediatric): Secondary | ICD-10-CM | POA: Diagnosis not present

## 2017-09-25 DIAGNOSIS — E782 Mixed hyperlipidemia: Secondary | ICD-10-CM | POA: Diagnosis not present

## 2017-09-25 DIAGNOSIS — E559 Vitamin D deficiency, unspecified: Secondary | ICD-10-CM | POA: Diagnosis not present

## 2017-09-25 DIAGNOSIS — K219 Gastro-esophageal reflux disease without esophagitis: Secondary | ICD-10-CM | POA: Diagnosis not present

## 2017-09-25 DIAGNOSIS — E1165 Type 2 diabetes mellitus with hyperglycemia: Secondary | ICD-10-CM | POA: Diagnosis not present

## 2017-09-25 DIAGNOSIS — I10 Essential (primary) hypertension: Secondary | ICD-10-CM | POA: Diagnosis not present

## 2017-10-13 DIAGNOSIS — G4733 Obstructive sleep apnea (adult) (pediatric): Secondary | ICD-10-CM | POA: Diagnosis not present

## 2017-11-06 DIAGNOSIS — M791 Myalgia, unspecified site: Secondary | ICD-10-CM | POA: Diagnosis not present

## 2017-11-06 DIAGNOSIS — H8113 Benign paroxysmal vertigo, bilateral: Secondary | ICD-10-CM | POA: Diagnosis not present

## 2017-11-06 DIAGNOSIS — I10 Essential (primary) hypertension: Secondary | ICD-10-CM | POA: Diagnosis not present

## 2017-11-06 DIAGNOSIS — E1165 Type 2 diabetes mellitus with hyperglycemia: Secondary | ICD-10-CM | POA: Diagnosis not present

## 2017-11-06 DIAGNOSIS — T466X5A Adverse effect of antihyperlipidemic and antiarteriosclerotic drugs, initial encounter: Secondary | ICD-10-CM | POA: Diagnosis not present

## 2017-11-13 DIAGNOSIS — G4733 Obstructive sleep apnea (adult) (pediatric): Secondary | ICD-10-CM | POA: Diagnosis not present

## 2017-11-20 DIAGNOSIS — D1801 Hemangioma of skin and subcutaneous tissue: Secondary | ICD-10-CM | POA: Diagnosis not present

## 2017-11-20 DIAGNOSIS — L821 Other seborrheic keratosis: Secondary | ICD-10-CM | POA: Diagnosis not present

## 2017-11-20 DIAGNOSIS — R233 Spontaneous ecchymoses: Secondary | ICD-10-CM | POA: Diagnosis not present

## 2017-12-05 DIAGNOSIS — M47812 Spondylosis without myelopathy or radiculopathy, cervical region: Secondary | ICD-10-CM | POA: Diagnosis not present

## 2017-12-05 DIAGNOSIS — S129XXS Fracture of neck, unspecified, sequela: Secondary | ICD-10-CM | POA: Diagnosis not present

## 2017-12-05 DIAGNOSIS — M4802 Spinal stenosis, cervical region: Secondary | ICD-10-CM | POA: Diagnosis not present

## 2017-12-05 DIAGNOSIS — M961 Postlaminectomy syndrome, not elsewhere classified: Secondary | ICD-10-CM | POA: Diagnosis not present

## 2017-12-13 DIAGNOSIS — G4733 Obstructive sleep apnea (adult) (pediatric): Secondary | ICD-10-CM | POA: Diagnosis not present

## 2017-12-25 DIAGNOSIS — E1165 Type 2 diabetes mellitus with hyperglycemia: Secondary | ICD-10-CM | POA: Diagnosis not present

## 2017-12-25 DIAGNOSIS — E782 Mixed hyperlipidemia: Secondary | ICD-10-CM | POA: Diagnosis not present

## 2017-12-25 DIAGNOSIS — Z79899 Other long term (current) drug therapy: Secondary | ICD-10-CM | POA: Diagnosis not present

## 2017-12-26 DIAGNOSIS — I10 Essential (primary) hypertension: Secondary | ICD-10-CM | POA: Diagnosis not present

## 2017-12-26 DIAGNOSIS — K219 Gastro-esophageal reflux disease without esophagitis: Secondary | ICD-10-CM | POA: Diagnosis not present

## 2017-12-26 DIAGNOSIS — G4700 Insomnia, unspecified: Secondary | ICD-10-CM | POA: Diagnosis not present

## 2017-12-26 DIAGNOSIS — E1165 Type 2 diabetes mellitus with hyperglycemia: Secondary | ICD-10-CM | POA: Diagnosis not present

## 2017-12-26 DIAGNOSIS — E782 Mixed hyperlipidemia: Secondary | ICD-10-CM | POA: Diagnosis not present

## 2018-01-13 DIAGNOSIS — G4733 Obstructive sleep apnea (adult) (pediatric): Secondary | ICD-10-CM | POA: Diagnosis not present

## 2018-02-05 DIAGNOSIS — G4733 Obstructive sleep apnea (adult) (pediatric): Secondary | ICD-10-CM | POA: Diagnosis not present

## 2018-02-05 DIAGNOSIS — R5383 Other fatigue: Secondary | ICD-10-CM | POA: Diagnosis not present

## 2018-02-13 DIAGNOSIS — M47812 Spondylosis without myelopathy or radiculopathy, cervical region: Secondary | ICD-10-CM | POA: Diagnosis not present

## 2018-02-13 DIAGNOSIS — M961 Postlaminectomy syndrome, not elsewhere classified: Secondary | ICD-10-CM | POA: Diagnosis not present

## 2018-02-13 DIAGNOSIS — S129XXS Fracture of neck, unspecified, sequela: Secondary | ICD-10-CM | POA: Diagnosis not present

## 2018-02-13 DIAGNOSIS — G4733 Obstructive sleep apnea (adult) (pediatric): Secondary | ICD-10-CM | POA: Diagnosis not present

## 2018-04-27 DIAGNOSIS — E782 Mixed hyperlipidemia: Secondary | ICD-10-CM | POA: Diagnosis not present

## 2018-04-27 DIAGNOSIS — E1165 Type 2 diabetes mellitus with hyperglycemia: Secondary | ICD-10-CM | POA: Diagnosis not present

## 2018-04-27 DIAGNOSIS — I10 Essential (primary) hypertension: Secondary | ICD-10-CM | POA: Diagnosis not present

## 2018-04-27 DIAGNOSIS — K219 Gastro-esophageal reflux disease without esophagitis: Secondary | ICD-10-CM | POA: Diagnosis not present

## 2018-04-27 DIAGNOSIS — Z79899 Other long term (current) drug therapy: Secondary | ICD-10-CM | POA: Diagnosis not present

## 2018-05-03 DIAGNOSIS — M4802 Spinal stenosis, cervical region: Secondary | ICD-10-CM | POA: Diagnosis not present

## 2018-05-03 DIAGNOSIS — M47812 Spondylosis without myelopathy or radiculopathy, cervical region: Secondary | ICD-10-CM | POA: Diagnosis not present

## 2018-05-03 DIAGNOSIS — S129XXS Fracture of neck, unspecified, sequela: Secondary | ICD-10-CM | POA: Diagnosis not present

## 2018-05-03 DIAGNOSIS — M961 Postlaminectomy syndrome, not elsewhere classified: Secondary | ICD-10-CM | POA: Diagnosis not present

## 2018-05-30 DIAGNOSIS — E1165 Type 2 diabetes mellitus with hyperglycemia: Secondary | ICD-10-CM | POA: Diagnosis not present

## 2018-05-30 DIAGNOSIS — S99922A Unspecified injury of left foot, initial encounter: Secondary | ICD-10-CM | POA: Diagnosis not present

## 2018-05-30 DIAGNOSIS — E559 Vitamin D deficiency, unspecified: Secondary | ICD-10-CM | POA: Diagnosis not present

## 2018-07-03 DIAGNOSIS — M19072 Primary osteoarthritis, left ankle and foot: Secondary | ICD-10-CM | POA: Diagnosis not present

## 2018-07-03 DIAGNOSIS — S129XXS Fracture of neck, unspecified, sequela: Secondary | ICD-10-CM | POA: Diagnosis not present

## 2018-07-03 DIAGNOSIS — M4802 Spinal stenosis, cervical region: Secondary | ICD-10-CM | POA: Diagnosis not present

## 2018-07-03 DIAGNOSIS — M961 Postlaminectomy syndrome, not elsewhere classified: Secondary | ICD-10-CM | POA: Diagnosis not present

## 2018-08-10 DIAGNOSIS — E782 Mixed hyperlipidemia: Secondary | ICD-10-CM | POA: Diagnosis not present

## 2018-08-10 DIAGNOSIS — E1165 Type 2 diabetes mellitus with hyperglycemia: Secondary | ICD-10-CM | POA: Diagnosis not present

## 2018-08-10 DIAGNOSIS — I10 Essential (primary) hypertension: Secondary | ICD-10-CM | POA: Diagnosis not present

## 2018-08-16 DIAGNOSIS — R229 Localized swelling, mass and lump, unspecified: Secondary | ICD-10-CM | POA: Diagnosis not present

## 2018-08-30 DIAGNOSIS — I10 Essential (primary) hypertension: Secondary | ICD-10-CM | POA: Diagnosis not present

## 2018-08-30 DIAGNOSIS — E1165 Type 2 diabetes mellitus with hyperglycemia: Secondary | ICD-10-CM | POA: Diagnosis not present

## 2018-08-30 DIAGNOSIS — R319 Hematuria, unspecified: Secondary | ICD-10-CM | POA: Diagnosis not present

## 2018-09-07 DIAGNOSIS — N3001 Acute cystitis with hematuria: Secondary | ICD-10-CM | POA: Diagnosis not present

## 2018-09-07 DIAGNOSIS — N200 Calculus of kidney: Secondary | ICD-10-CM | POA: Diagnosis not present

## 2018-09-07 DIAGNOSIS — R310 Gross hematuria: Secondary | ICD-10-CM | POA: Diagnosis not present

## 2018-09-13 DIAGNOSIS — Z79899 Other long term (current) drug therapy: Secondary | ICD-10-CM | POA: Diagnosis not present

## 2018-09-13 DIAGNOSIS — Z87891 Personal history of nicotine dependence: Secondary | ICD-10-CM | POA: Diagnosis not present

## 2018-09-13 DIAGNOSIS — R109 Unspecified abdominal pain: Secondary | ICD-10-CM | POA: Diagnosis not present

## 2018-09-13 DIAGNOSIS — N200 Calculus of kidney: Secondary | ICD-10-CM | POA: Diagnosis not present

## 2018-09-13 DIAGNOSIS — E785 Hyperlipidemia, unspecified: Secondary | ICD-10-CM | POA: Diagnosis not present

## 2018-09-13 DIAGNOSIS — Z9989 Dependence on other enabling machines and devices: Secondary | ICD-10-CM | POA: Diagnosis not present

## 2018-09-13 DIAGNOSIS — I10 Essential (primary) hypertension: Secondary | ICD-10-CM | POA: Diagnosis not present

## 2018-09-13 DIAGNOSIS — E119 Type 2 diabetes mellitus without complications: Secondary | ICD-10-CM | POA: Diagnosis not present

## 2018-09-13 DIAGNOSIS — G4733 Obstructive sleep apnea (adult) (pediatric): Secondary | ICD-10-CM | POA: Diagnosis not present

## 2018-09-13 DIAGNOSIS — Z7984 Long term (current) use of oral hypoglycemic drugs: Secondary | ICD-10-CM | POA: Diagnosis not present

## 2018-09-14 DIAGNOSIS — E119 Type 2 diabetes mellitus without complications: Secondary | ICD-10-CM | POA: Diagnosis not present

## 2018-09-14 DIAGNOSIS — N200 Calculus of kidney: Secondary | ICD-10-CM | POA: Diagnosis not present

## 2018-09-21 DIAGNOSIS — S129XXS Fracture of neck, unspecified, sequela: Secondary | ICD-10-CM | POA: Diagnosis not present

## 2018-09-21 DIAGNOSIS — G894 Chronic pain syndrome: Secondary | ICD-10-CM | POA: Diagnosis not present

## 2018-09-21 DIAGNOSIS — N201 Calculus of ureter: Secondary | ICD-10-CM | POA: Diagnosis not present

## 2018-09-21 DIAGNOSIS — N3020 Other chronic cystitis without hematuria: Secondary | ICD-10-CM | POA: Diagnosis not present

## 2018-09-21 DIAGNOSIS — N3090 Cystitis, unspecified without hematuria: Secondary | ICD-10-CM | POA: Diagnosis not present

## 2018-09-21 DIAGNOSIS — M961 Postlaminectomy syndrome, not elsewhere classified: Secondary | ICD-10-CM | POA: Diagnosis not present

## 2018-09-28 DIAGNOSIS — G4730 Sleep apnea, unspecified: Secondary | ICD-10-CM | POA: Diagnosis not present

## 2018-09-28 DIAGNOSIS — E119 Type 2 diabetes mellitus without complications: Secondary | ICD-10-CM | POA: Diagnosis not present

## 2018-09-28 DIAGNOSIS — Z9989 Dependence on other enabling machines and devices: Secondary | ICD-10-CM | POA: Diagnosis not present

## 2018-09-28 DIAGNOSIS — I10 Essential (primary) hypertension: Secondary | ICD-10-CM | POA: Diagnosis not present

## 2018-09-28 DIAGNOSIS — E7800 Pure hypercholesterolemia, unspecified: Secondary | ICD-10-CM | POA: Diagnosis not present

## 2018-09-28 DIAGNOSIS — N200 Calculus of kidney: Secondary | ICD-10-CM | POA: Diagnosis not present

## 2018-09-28 DIAGNOSIS — K219 Gastro-esophageal reflux disease without esophagitis: Secondary | ICD-10-CM | POA: Diagnosis not present

## 2018-09-28 DIAGNOSIS — Z7984 Long term (current) use of oral hypoglycemic drugs: Secondary | ICD-10-CM | POA: Diagnosis not present

## 2018-09-28 DIAGNOSIS — Z79899 Other long term (current) drug therapy: Secondary | ICD-10-CM | POA: Diagnosis not present

## 2018-10-05 DIAGNOSIS — N3020 Other chronic cystitis without hematuria: Secondary | ICD-10-CM | POA: Diagnosis not present

## 2018-10-05 DIAGNOSIS — N201 Calculus of ureter: Secondary | ICD-10-CM | POA: Diagnosis not present

## 2018-11-03 ENCOUNTER — Encounter (INDEPENDENT_AMBULATORY_CARE_PROVIDER_SITE_OTHER): Payer: Self-pay

## 2018-11-06 DIAGNOSIS — N3090 Cystitis, unspecified without hematuria: Secondary | ICD-10-CM | POA: Diagnosis not present

## 2018-11-06 DIAGNOSIS — N3020 Other chronic cystitis without hematuria: Secondary | ICD-10-CM | POA: Diagnosis not present

## 2018-11-06 DIAGNOSIS — N201 Calculus of ureter: Secondary | ICD-10-CM | POA: Diagnosis not present

## 2018-11-15 DIAGNOSIS — R310 Gross hematuria: Secondary | ICD-10-CM | POA: Diagnosis not present

## 2018-11-15 DIAGNOSIS — R109 Unspecified abdominal pain: Secondary | ICD-10-CM | POA: Diagnosis not present

## 2018-11-15 DIAGNOSIS — N200 Calculus of kidney: Secondary | ICD-10-CM | POA: Diagnosis not present

## 2018-11-16 ENCOUNTER — Other Ambulatory Visit: Payer: Self-pay | Admitting: Urology

## 2018-11-16 DIAGNOSIS — G894 Chronic pain syndrome: Secondary | ICD-10-CM | POA: Diagnosis not present

## 2018-11-16 DIAGNOSIS — S129XXD Fracture of neck, unspecified, subsequent encounter: Secondary | ICD-10-CM | POA: Diagnosis not present

## 2018-11-16 DIAGNOSIS — M961 Postlaminectomy syndrome, not elsewhere classified: Secondary | ICD-10-CM | POA: Diagnosis not present

## 2018-11-19 ENCOUNTER — Other Ambulatory Visit (HOSPITAL_COMMUNITY): Payer: Medicare Other

## 2018-11-19 ENCOUNTER — Other Ambulatory Visit (HOSPITAL_COMMUNITY)
Admission: RE | Admit: 2018-11-19 | Discharge: 2018-11-19 | Disposition: A | Discharge status: Home / Self Care | Payer: Medicare Other | Source: Ambulatory Visit | Attending: Urology | Admitting: Urology

## 2018-11-19 DIAGNOSIS — R319 Hematuria, unspecified: Secondary | ICD-10-CM | POA: Diagnosis not present

## 2018-11-19 DIAGNOSIS — Z01812 Encounter for preprocedural laboratory examination: Secondary | ICD-10-CM | POA: Diagnosis not present

## 2018-11-19 DIAGNOSIS — E1165 Type 2 diabetes mellitus with hyperglycemia: Secondary | ICD-10-CM | POA: Diagnosis not present

## 2018-11-19 DIAGNOSIS — N200 Calculus of kidney: Secondary | ICD-10-CM | POA: Diagnosis not present

## 2018-11-19 DIAGNOSIS — Z20828 Contact with and (suspected) exposure to other viral communicable diseases: Secondary | ICD-10-CM | POA: Diagnosis not present

## 2018-11-19 DIAGNOSIS — I10 Essential (primary) hypertension: Secondary | ICD-10-CM | POA: Diagnosis not present

## 2018-11-19 DIAGNOSIS — Z0000 Encounter for general adult medical examination without abnormal findings: Secondary | ICD-10-CM | POA: Diagnosis not present

## 2018-11-20 ENCOUNTER — Other Ambulatory Visit: Payer: Self-pay

## 2018-11-20 ENCOUNTER — Encounter (HOSPITAL_BASED_OUTPATIENT_CLINIC_OR_DEPARTMENT_OTHER): Payer: Self-pay | Admitting: *Deleted

## 2018-11-20 LAB — NOVEL CORONAVIRUS, NAA (HOSP ORDER, SEND-OUT TO REF LAB; TAT 18-24 HRS): SARS-CoV-2, NAA: NOT DETECTED

## 2018-11-20 NOTE — Progress Notes (Addendum)
Spoke with patient via telephone for pre op interview. NPO after MN. Patient to take Norvasc, Atorvastatin, Zofran and Ultram if needed AM of surgery with a sip of water.Will need ISTAT 8 and EKG AM of surgery. Arrival time 0530.

## 2018-11-21 NOTE — H&P (Signed)
CC/HPI: I have blood in my urine.     Frank Ware is a 68 yo WM who is sent by Dr. [name] for hematuria. He saw Dr. [name] and had a left ureteral stent placed for an obstructing stone. He has subsequently had 2 lithotripsies and is scheduled for a 3rd in a couple of weeks. He still has the stent and he will have some left flank pain and pressure. He has some hematuria that has been present since August. He had a stone in the left kidney treated about 25 years ago and had ESWL that was unsuccessful and he thinks this is the same stone. He has had no history of UTI's.     ALLERGIES: None   MEDICATIONS: Hydrochlorothiazide 25 mg tablet  Metformin Hcl 1,000 mg tablet  Amlodipine Besylate 2.5 mg tablet  Atorvastatin Calcium 20 mg tablet  Glipizide 10 mg tablet  Januvia 100 mg tablet  Meclizine Hcl 25 mg tablet  Olmesartan Medoxomil 40 mg tablet  Ondansetron Hcl 8 mg tablet  Pioglitazone Hcl 45 mg tablet  Tramadol Hcl 50 mg tablet  Valsartan 160 mg tablet     GU PSH: Cystoscopy Insert Stent, Left - about 09/06/2018 ESWL - 09/27/2018, Left - 09/14/2018       PSH Notes: cervical fusion     NON-GU PSH: Knee replacement, Right     GU PMH: None   NON-GU PMH: Diabetes Type 2 Hypercholesterolemia Hypertension    FAMILY HISTORY: Kidney Stones - Mother   SOCIAL HISTORY: Marital Status: Married Preferred Language: English; Race: White Current Smoking Status: Patient has never smoked.   Tobacco Use Assessment Completed: Used Tobacco in last 30 days? Patient's occupation is/was Retired from [employer]     Notes: 3 sons, 2 daughters    REVIEW OF SYSTEMS:    GU Review Male:   Patient reports frequent urination, burning/ pain with urination, and get up at night to urinate. Patient denies hard to postpone urination, leakage of urine, stream starts and stops, trouble starting your stream, have to strain to urinate , erection problems, and penile pain.  Gastrointestinal (Upper):   Patient  denies nausea, vomiting, and indigestion/ heartburn.  Gastrointestinal (Lower):   Patient denies diarrhea and constipation.  Constitutional:   Patient denies fever, night sweats, weight loss, and fatigue.  Skin:   Patient denies skin rash/ lesion and itching.  Eyes:   Patient denies double vision and blurred vision.  Ears/ Nose/ Throat:   Patient denies sore throat and sinus problems.  Hematologic/Lymphatic:   Patient denies swollen glands and easy bruising.  Cardiovascular:   Patient denies leg swelling and chest pains.  Respiratory:   Patient denies cough and shortness of breath.  Endocrine:   Patient denies excessive thirst.  Musculoskeletal:   Patient denies back pain and joint pain.  Neurological:   Patient denies headaches and dizziness.  Psychologic:   Patient denies depression and anxiety.   VITAL SIGNS:      11/15/2018 02:02 PM  Weight 224 lb / 101.6 kg  Height 66 in / 167.64 cm  BP 104/70 mmHg  Pulse 116 /min  Temperature 98.6 F / 37 C  BMI 36.2 kg/m   MULTI-SYSTEM PHYSICAL EXAMINATION:    Constitutional: Obese. No physical deformities. Normally developed. Good grooming.   Neck: Neck symmetrical, not swollen. Normal tracheal position. cervical laminectomy scar noted.   Respiratory: Normal breath sounds. No labored breathing, no use of accessory muscles.   Cardiovascular: He has sinus tachycardia with a possible  murmur.   Lymphatic: No enlargement, no tenderness of supraclavicular, neck lymph nodes.  Skin: No paleness, no jaundice, no cyanosis. No lesion, no ulcer, no rash.  Neurologic / Psychiatric: Oriented to time, oriented to place, oriented to person. No depression, no anxiety, no agitation.  Gastrointestinal: Obese abdomen. No mass, no tenderness, no rigidity.   Musculoskeletal: Normal gait and station of head and neck.     PAST DATA REVIEWED:  Source Of History:  Patient  Records Review:   AUA Symptom Score, Previous Doctor Records  Urine Test Review:    Urinalysis  X-Ray Review: KUB: Reviewed Films. Discussed With Patient.  C.T. Stone Protocol: Reviewed Films. Discussed With Patient.    Notes:                     Records from Dr. [name] reviewed. [name]    PROCEDURES:         C.T. Urogram - [id number]  The left stent is in good position. There appears to be some dilation vs peripelvic cysts in the left collecting system. He has a collection of stones in the lower pole that appear to be well fragment but possibly entrapped from infundibular stenosis. There is a complex cyst on the left mid kidney lateral with some calcification. The full report is pending.       Patient confirmed No Neulasta OnPro Device.           Urinalysis w/Scope Dipstick Dipstick Cont'd Micro  Color: Red Bilirubin: Neg mg/dL WBC/hpf: 6 - 10/hpf  Appearance: Cloudy Ketones: Trace mg/dL RBC/hpf: >60/hpf  Specific Gravity: 1.025 Blood: 3+ ery/uL Bacteria: Many (>50/hpf)  pH: 5.5 Protein: 3+ mg/dL Cystals: Amorph Urates  Glucose: Neg mg/dL Urobilinogen: 0.2 mg/dL Casts: NS (Not Seen)    Nitrites: Positive Trichomonas: Not Present    Leukocyte Esterase: 3+ leu/uL Mucous: Not Present      Epithelial Cells: 0 - 5/hpf      Yeast: NS (Not Seen)      Sperm: Not Present    Notes: Microscopic not concentrated.    ASSESSMENT:      ICD-10 Details  1 GU:   Gross hematuria - R31.0 Urine culture sent. Hematuria at this point is likely related to the stent. I will treat if the culture is positive.   2   Renal calculus - N20.0 Left, His stone is well fragmented in the LLP and I don't think further ESWL is indicated. I discussed ureteroscopy vs PCNL and since he has the stent in place, ureteroscopy would be my recommendation and he is agreeable to that. I have reviewed the risks of ureteroscopy including bleeding, infection, ureteral injury, need for a stent or secondary procedures, thrombotic events and anesthetic complications.    PLAN:            Medications New Meds:  Hydrocodone-Acetaminophen 5 mg-325 mg tablet 1-2 tablet PO Q 6 H PRN   #15  0 Refill(s)            Orders Labs Urine Culture  X-Rays: C.T. Stone Protocol Without Contrast  X-Ray Notes: History:  Hematuria: Yes/No  Patient to see MD after exam: Yes/No  Previous exam: CT / IVP/ US/ KUB/ None  When:  Where:  Diabetic: Yes/ No  BUN/ Creatinine:  Date of last BUN Creatinine:  Weight in pounds:  Allergy- IV Contrast: Yes/ No  Conflicting diabetic meds: Yes/ No  Diabetic Meds:  Prior Authorization #: NA case [id number]  Schedule Return Visit/Planned Activity: ASAP - Schedule Surgery

## 2018-11-21 NOTE — Anesthesia Preprocedure Evaluation (Addendum)
Anesthesia Evaluation  Patient identified by MRN, date of birth, ID band Patient awake    Reviewed: Allergy & Precautions, NPO status , Patient's Chart, lab work & pertinent test results  History of Anesthesia Complications Negative for: history of anesthetic complications  Airway Mallampati: II  TM Distance: >3 FB Neck ROM: Full    Dental  (+) Dental Advisory Given, Teeth Intact   Pulmonary neg pulmonary ROS,    Pulmonary exam normal        Cardiovascular hypertension, Pt. on medications Normal cardiovascular exam     Neuro/Psych negative neurological ROS  negative psych ROS   GI/Hepatic negative GI ROS, Neg liver ROS,   Endo/Other  diabetes, Type 2, Oral Hypoglycemic Agents Obesity   Renal/GU  Kidney stones      Musculoskeletal negative musculoskeletal ROS (+)   Abdominal   Peds  Hematology negative hematology ROS (+)   Anesthesia Other Findings   Reproductive/Obstetrics                            Anesthesia Physical Anesthesia Plan  ASA: II  Anesthesia Plan: General   Post-op Pain Management:    Induction: Intravenous  PONV Risk Score and Plan: 2 and Treatment may vary due to age or medical condition, Ondansetron and Dexamethasone  Airway Management Planned: LMA  Additional Equipment: None  Intra-op Plan:   Post-operative Plan: Extubation in OR  Informed Consent: I have reviewed the patients History and Physical, chart, labs and discussed the procedure including the risks, benefits and alternatives for the proposed anesthesia with the patient or authorized representative who has indicated his/her understanding and acceptance.     Dental advisory given  Plan Discussed with: CRNA and Anesthesiologist  Anesthesia Plan Comments:        Anesthesia Quick Evaluation

## 2018-11-22 ENCOUNTER — Ambulatory Visit (HOSPITAL_BASED_OUTPATIENT_CLINIC_OR_DEPARTMENT_OTHER): Payer: Medicare Other | Admitting: Anesthesiology

## 2018-11-22 ENCOUNTER — Ambulatory Visit (HOSPITAL_BASED_OUTPATIENT_CLINIC_OR_DEPARTMENT_OTHER)
Admission: RE | Admit: 2018-11-22 | Discharge: 2018-11-22 | Disposition: A | Discharge status: Home / Self Care | Payer: Medicare Other | Attending: Urology | Admitting: Urology

## 2018-11-22 ENCOUNTER — Encounter (HOSPITAL_BASED_OUTPATIENT_CLINIC_OR_DEPARTMENT_OTHER)
Admission: RE | Disposition: A | Discharge status: Home / Self Care | Payer: Self-pay | Source: Home / Self Care | Attending: Urology

## 2018-11-22 ENCOUNTER — Other Ambulatory Visit: Payer: Self-pay

## 2018-11-22 ENCOUNTER — Encounter (HOSPITAL_BASED_OUTPATIENT_CLINIC_OR_DEPARTMENT_OTHER): Payer: Self-pay | Admitting: *Deleted

## 2018-11-22 DIAGNOSIS — E7800 Pure hypercholesterolemia, unspecified: Secondary | ICD-10-CM | POA: Diagnosis not present

## 2018-11-22 DIAGNOSIS — Z87442 Personal history of urinary calculi: Secondary | ICD-10-CM | POA: Insufficient documentation

## 2018-11-22 DIAGNOSIS — Z7984 Long term (current) use of oral hypoglycemic drugs: Secondary | ICD-10-CM | POA: Insufficient documentation

## 2018-11-22 DIAGNOSIS — Z79899 Other long term (current) drug therapy: Secondary | ICD-10-CM | POA: Diagnosis not present

## 2018-11-22 DIAGNOSIS — Z96651 Presence of right artificial knee joint: Secondary | ICD-10-CM | POA: Diagnosis not present

## 2018-11-22 DIAGNOSIS — E669 Obesity, unspecified: Secondary | ICD-10-CM | POA: Diagnosis not present

## 2018-11-22 DIAGNOSIS — N132 Hydronephrosis with renal and ureteral calculous obstruction: Secondary | ICD-10-CM | POA: Insufficient documentation

## 2018-11-22 DIAGNOSIS — R310 Gross hematuria: Secondary | ICD-10-CM | POA: Diagnosis not present

## 2018-11-22 DIAGNOSIS — I10 Essential (primary) hypertension: Secondary | ICD-10-CM | POA: Insufficient documentation

## 2018-11-22 DIAGNOSIS — N200 Calculus of kidney: Secondary | ICD-10-CM | POA: Diagnosis not present

## 2018-11-22 DIAGNOSIS — E119 Type 2 diabetes mellitus without complications: Secondary | ICD-10-CM | POA: Insufficient documentation

## 2018-11-22 DIAGNOSIS — Z79891 Long term (current) use of opiate analgesic: Secondary | ICD-10-CM | POA: Diagnosis not present

## 2018-11-22 DIAGNOSIS — Z6836 Body mass index (BMI) 36.0-36.9, adult: Secondary | ICD-10-CM | POA: Insufficient documentation

## 2018-11-22 HISTORY — PX: CYSTOSCOPY/URETEROSCOPY/HOLMIUM LASER/STENT PLACEMENT: SHX6546

## 2018-11-22 HISTORY — DX: Personal history of urinary calculi: Z87.442

## 2018-11-22 HISTORY — DX: Essential (primary) hypertension: I10

## 2018-11-22 LAB — POCT I-STAT, CHEM 8
BUN: 33 mg/dL — ABNORMAL HIGH (ref 8–23)
Calcium, Ion: 1.32 mmol/L (ref 1.15–1.40)
Chloride: 100 mmol/L (ref 98–111)
Creatinine, Ser: 1.1 mg/dL (ref 0.61–1.24)
Glucose, Bld: 133 mg/dL — ABNORMAL HIGH (ref 70–99)
HCT: 37 % — ABNORMAL LOW (ref 39.0–52.0)
Hemoglobin: 12.6 g/dL — ABNORMAL LOW (ref 13.0–17.0)
Potassium: 4.1 mmol/L (ref 3.5–5.1)
Sodium: 137 mmol/L (ref 135–145)
TCO2: 26 mmol/L (ref 22–32)

## 2018-11-22 LAB — GLUCOSE, CAPILLARY: Glucose-Capillary: 186 mg/dL — ABNORMAL HIGH (ref 70–99)

## 2018-11-22 SURGERY — CYSTOSCOPY/URETEROSCOPY/HOLMIUM LASER/STENT PLACEMENT
Anesthesia: General | Site: Ureter | Laterality: Left

## 2018-11-22 MED ORDER — FENTANYL CITRATE (PF) 100 MCG/2ML IJ SOLN
25.0000 ug | INTRAMUSCULAR | Status: DC | PRN
  Administered 2018-11-22 (×2): 50 ug via INTRAVENOUS
  Filled 2018-11-22: qty 1

## 2018-11-22 MED ORDER — ONDANSETRON HCL 4 MG/2ML IJ SOLN
4.0000 mg | Freq: Once | INTRAMUSCULAR | Status: DC | PRN
  Filled 2018-11-22: qty 2

## 2018-11-22 MED ORDER — ACETAMINOPHEN 650 MG RE SUPP
650.0000 mg | RECTAL | Status: DC | PRN
  Filled 2018-11-22: qty 1

## 2018-11-22 MED ORDER — SODIUM CHLORIDE 0.9 % IV SOLN
250.0000 mL | INTRAVENOUS | Status: DC | PRN
  Filled 2018-11-22: qty 250

## 2018-11-22 MED ORDER — IOHEXOL 300 MG/ML  SOLN
INTRAMUSCULAR | Status: DC | PRN
  Administered 2018-11-22: 3 mL

## 2018-11-22 MED ORDER — PROPOFOL 10 MG/ML IV BOLUS
INTRAVENOUS | Status: DC | PRN
  Administered 2018-11-22: 10 mg via INTRAVENOUS
  Administered 2018-11-22: 20 mg via INTRAVENOUS
  Administered 2018-11-22: 10 mg via INTRAVENOUS
  Administered 2018-11-22: 180 mg via INTRAVENOUS
  Administered 2018-11-22 (×2): 10 mg via INTRAVENOUS

## 2018-11-22 MED ORDER — DEXAMETHASONE SODIUM PHOSPHATE 10 MG/ML IJ SOLN
INTRAMUSCULAR | Status: AC
  Filled 2018-11-22: qty 1

## 2018-11-22 MED ORDER — OXYCODONE HCL 5 MG/5ML PO SOLN
5.0000 mg | Freq: Once | ORAL | Status: AC | PRN
  Filled 2018-11-22: qty 5

## 2018-11-22 MED ORDER — HYDROMORPHONE HCL 1 MG/ML IJ SOLN
INTRAMUSCULAR | Status: AC
  Filled 2018-11-22: qty 1

## 2018-11-22 MED ORDER — HYDROMORPHONE HCL 1 MG/ML IJ SOLN
0.5000 mg | INTRAMUSCULAR | Status: DC | PRN
  Administered 2018-11-22: 0.25 mg via INTRAVENOUS
  Administered 2018-11-22: 0.5 mg via INTRAVENOUS
  Filled 2018-11-22: qty 0.5

## 2018-11-22 MED ORDER — LACTATED RINGERS IV SOLN
INTRAVENOUS | Status: DC
  Administered 2018-11-22 (×3): via INTRAVENOUS
  Filled 2018-11-22: qty 1000

## 2018-11-22 MED ORDER — FENTANYL CITRATE (PF) 100 MCG/2ML IJ SOLN
25.0000 ug | INTRAMUSCULAR | Status: DC | PRN
  Administered 2018-11-22: 50 ug via INTRAVENOUS
  Filled 2018-11-22: qty 1

## 2018-11-22 MED ORDER — PHENYLEPHRINE HCL (PRESSORS) 10 MG/ML IV SOLN
INTRAVENOUS | Status: DC | PRN
  Administered 2018-11-22: 60 ug via INTRAVENOUS

## 2018-11-22 MED ORDER — ACETAMINOPHEN 10 MG/ML IV SOLN
1000.0000 mg | Freq: Once | INTRAVENOUS | Status: AC
  Administered 2018-11-22: 1000 mg via INTRAVENOUS
  Filled 2018-11-22: qty 100

## 2018-11-22 MED ORDER — ONDANSETRON HCL 4 MG/2ML IJ SOLN
INTRAMUSCULAR | Status: AC
  Filled 2018-11-22: qty 2

## 2018-11-22 MED ORDER — KETOROLAC TROMETHAMINE 30 MG/ML IJ SOLN
30.0000 mg | Freq: Once | INTRAMUSCULAR | Status: AC
  Administered 2018-11-22: 30 mg via INTRAVENOUS
  Filled 2018-11-22: qty 1

## 2018-11-22 MED ORDER — OXYCODONE HCL 5 MG PO TABS
ORAL_TABLET | ORAL | Status: AC
  Filled 2018-11-22: qty 1

## 2018-11-22 MED ORDER — ACETAMINOPHEN 10 MG/ML IV SOLN
INTRAVENOUS | Status: AC
  Filled 2018-11-22: qty 100

## 2018-11-22 MED ORDER — SODIUM CHLORIDE 0.9% FLUSH
3.0000 mL | Freq: Two times a day (BID) | INTRAVENOUS | Status: DC
  Filled 2018-11-22: qty 3

## 2018-11-22 MED ORDER — MIDAZOLAM HCL 2 MG/2ML IJ SOLN
INTRAMUSCULAR | Status: AC
  Filled 2018-11-22: qty 2

## 2018-11-22 MED ORDER — LIDOCAINE HCL 1 % IJ SOLN
INTRAMUSCULAR | Status: DC | PRN
  Administered 2018-11-22: 40 mg via INTRADERMAL

## 2018-11-22 MED ORDER — FENTANYL CITRATE (PF) 100 MCG/2ML IJ SOLN
INTRAMUSCULAR | Status: AC
  Filled 2018-11-22: qty 2

## 2018-11-22 MED ORDER — CEFAZOLIN SODIUM-DEXTROSE 2-4 GM/100ML-% IV SOLN
INTRAVENOUS | Status: AC
  Filled 2018-11-22: qty 100

## 2018-11-22 MED ORDER — ACETAMINOPHEN 325 MG PO TABS
650.0000 mg | ORAL_TABLET | ORAL | Status: DC | PRN
  Filled 2018-11-22: qty 2

## 2018-11-22 MED ORDER — PROPOFOL 10 MG/ML IV BOLUS
INTRAVENOUS | Status: AC
  Filled 2018-11-22: qty 20

## 2018-11-22 MED ORDER — CEFAZOLIN SODIUM-DEXTROSE 2-4 GM/100ML-% IV SOLN
2.0000 g | INTRAVENOUS | Status: AC
  Administered 2018-11-22: 2 g via INTRAVENOUS
  Filled 2018-11-22: qty 100

## 2018-11-22 MED ORDER — HYDROCODONE-ACETAMINOPHEN 10-325 MG PO TABS: 1.0000 | ORAL_TABLET | Freq: Four times a day (QID) | ORAL | 0 refills | Status: DC | PRN

## 2018-11-22 MED ORDER — MIDAZOLAM HCL 5 MG/5ML IJ SOLN
INTRAMUSCULAR | Status: DC | PRN
  Administered 2018-11-22 (×2): 1 mg via INTRAVENOUS

## 2018-11-22 MED ORDER — KETOROLAC TROMETHAMINE 30 MG/ML IJ SOLN
INTRAMUSCULAR | Status: AC
  Filled 2018-11-22: qty 1

## 2018-11-22 MED ORDER — PHENYLEPHRINE 40 MCG/ML (10ML) SYRINGE FOR IV PUSH (FOR BLOOD PRESSURE SUPPORT)
PREFILLED_SYRINGE | INTRAVENOUS | Status: AC
  Filled 2018-11-22: qty 10

## 2018-11-22 MED ORDER — SODIUM CHLORIDE 0.9 % IR SOLN
Status: DC | PRN
  Administered 2018-11-22 (×2): 3000 mL

## 2018-11-22 MED ORDER — SODIUM CHLORIDE 0.9% FLUSH
3.0000 mL | INTRAVENOUS | Status: DC | PRN
  Filled 2018-11-22: qty 3

## 2018-11-22 MED ORDER — LIDOCAINE 2% (20 MG/ML) 5 ML SYRINGE
INTRAMUSCULAR | Status: AC
  Filled 2018-11-22: qty 5

## 2018-11-22 MED ORDER — OXYCODONE HCL 5 MG PO TABS
5.0000 mg | ORAL_TABLET | Freq: Once | ORAL | Status: AC | PRN
  Administered 2018-11-22: 11:00:00 5 mg via ORAL
  Filled 2018-11-22: qty 1

## 2018-11-22 MED ORDER — FENTANYL CITRATE (PF) 100 MCG/2ML IJ SOLN
INTRAMUSCULAR | Status: DC | PRN
  Administered 2018-11-22 (×4): 25 ug via INTRAVENOUS
  Administered 2018-11-22: 50 ug via INTRAVENOUS
  Administered 2018-11-22 (×2): 25 ug via INTRAVENOUS

## 2018-11-22 MED ORDER — OXYCODONE HCL 5 MG PO TABS
5.0000 mg | ORAL_TABLET | ORAL | Status: DC | PRN
  Filled 2018-11-22: qty 2

## 2018-11-22 SURGICAL SUPPLY — 29 items
BAG DRAIN URO-CYSTO SKYTR STRL (DRAIN) ×2 IMPLANT
BASKET STONE 1.7 NGAGE (UROLOGICAL SUPPLIES) ×2 IMPLANT
BASKET ZERO TIP NITINOL 2.4FR (BASKET) ×2 IMPLANT
CATH URET 5FR 28IN CONE TIP (BALLOONS)
CATH URET 5FR 28IN OPEN ENDED (CATHETERS) ×2 IMPLANT
CATH URET 5FR 70CM CONE TIP (BALLOONS) IMPLANT
CLOTH BEACON ORANGE TIMEOUT ST (SAFETY) ×2 IMPLANT
ELECT REM PT RETURN 9FT ADLT (ELECTROSURGICAL)
ELECTRODE REM PT RTRN 9FT ADLT (ELECTROSURGICAL) IMPLANT
FIBER LASER FLEXIVA 365 (UROLOGICAL SUPPLIES) IMPLANT
FIBER LASER TRAC TIP (UROLOGICAL SUPPLIES) ×2 IMPLANT
GLOVE BIO SURGEON STRL SZ 6 (GLOVE) ×2 IMPLANT
GLOVE BIOGEL PI IND STRL 6.5 (GLOVE) ×2 IMPLANT
GLOVE BIOGEL PI INDICATOR 6.5 (GLOVE) ×2
GLOVE SURG SS PI 8.0 STRL IVOR (GLOVE) ×2 IMPLANT
GOWN STRL REUS W/TWL LRG LVL3 (GOWN DISPOSABLE) ×2 IMPLANT
GOWN STRL REUS W/TWL XL LVL3 (GOWN DISPOSABLE) ×2 IMPLANT
GUIDEWIRE ANG ZIPWIRE 038X150 (WIRE) IMPLANT
GUIDEWIRE STR DUAL SENSOR (WIRE) ×2 IMPLANT
IV NS IRRIG 3000ML ARTHROMATIC (IV SOLUTION) ×4 IMPLANT
KIT TURNOVER CYSTO (KITS) ×2 IMPLANT
MANIFOLD NEPTUNE II (INSTRUMENTS) ×2 IMPLANT
NS IRRIG 500ML POUR BTL (IV SOLUTION) IMPLANT
PACK CYSTO (CUSTOM PROCEDURE TRAY) ×2 IMPLANT
SHEATH URET ACCESS 12FR/55CM (UROLOGICAL SUPPLIES) ×2 IMPLANT
STENT POLARIS LOOP 7FR X 24 CM (STENTS) ×2 IMPLANT
STENT URET 6FRX24 CONTOUR (STENTS) IMPLANT
TUBE CONNECTING 12X1/4 (SUCTIONS) ×2 IMPLANT
TUBING UROLOGY SET (TUBING) ×2 IMPLANT

## 2018-11-22 NOTE — Discharge Instructions (Addendum)
Ureteral Stent Implantation, Care After °This sheet gives you information about how to care for yourself after your procedure. Your health care provider may also give you more specific instructions. If you have problems or questions, contact your health care provider. °What can I expect after the procedure? °After the procedure, it is common to have: °· Nausea. °· Mild pain when you urinate. You may feel this pain in your lower back or lower abdomen. The pain should stop within a few minutes after you urinate. This may last for up to 1 week. °· A small amount of blood in your urine for several days. °Follow these instructions at home: °Medicines °· Take over-the-counter and prescription medicines only as told by your health care provider. °· If you were prescribed an antibiotic medicine, take it as told by your health care provider. Do not stop taking the antibiotic even if you start to feel better. °· Do not drive for 24 hours if you were given a sedative during your procedure. °· Ask your health care provider if the medicine prescribed to you requires you to avoid driving or using heavy machinery. °Activity °· Rest as told by your health care provider. °· Avoid sitting for a long time without moving. Get up to take short walks every 1-2 hours. This is important to improve blood flow and breathing. Ask for help if you feel weak or unsteady. °· Return to your normal activities as told by your health care provider. Ask your health care provider what activities are safe for you. °General instructions ° °· Watch for any blood in your urine. Call your health care provider if the amount of blood in your urine increases. °· If you have a catheter: °? Follow instructions from your health care provider about taking care of your catheter and collection bag. °? Do not take baths, swim, or use a hot tub until your health care provider approves. Ask your health care provider if you may take showers. You may only be allowed to  take sponge baths. °· Drink enough fluid to keep your urine pale yellow. °· Do not use any products that contain nicotine or tobacco, such as cigarettes, e-cigarettes, and chewing tobacco. These can delay healing after surgery. If you need help quitting, ask your health care provider. °· Keep all follow-up visits as told by your health care provider. This is important. °Contact a health care provider if: °· You have pain that gets worse or does not get better with medicine, especially pain when you urinate. °· You have difficulty urinating. °· You feel nauseous or you vomit repeatedly during a period of more than 2 days after the procedure. °Get help right away if: °· Your urine is dark red or has blood clots in it. °· You are leaking urine (have incontinence). °· The end of the stent comes out of your urethra. °· You cannot urinate. °· You have sudden, sharp, or severe pain in your abdomen or lower back. °· You have a fever. °· You have swelling or pain in your legs. °· You have difficulty breathing. °Summary °· After the procedure, it is common to have mild pain when you urinate that goes away within a few minutes after you urinate. This may last for up to 1 week. °· Watch for any blood in your urine. Call your health care provider if the amount of blood in your urine increases. °· Take over-the-counter and prescription medicines only as told by your health care provider. °· Drink   enough fluid to keep your urine pale yellow.  Please contact your pain management physician for any additional breakthrough pain med needs.   This information is not intended to replace advice given to you by your health care provider. Make sure you discuss any questions you have with your health care provider. Document Released: 08/29/2012 Document Revised: 10/03/2017 Document Reviewed: 10/04/2017 Elsevier Patient Education  [place] Instructions  Activity: Get plenty of rest for the  remainder of the day. A responsible individual must stay with you for 24 hours following the procedure.  For the next 24 hours, DO NOT: -Drive a car -Paediatric nurse -Drink alcoholic beverages -Take any medication unless instructed by your physician -Make any legal decisions or sign important papers.  Meals: Start with liquid foods such as gelatin or soup. Progress to regular foods as tolerated. Avoid greasy, spicy, heavy foods. If nausea and/or vomiting occur, drink only clear liquids until the nausea and/or vomiting subsides. Call your physician if vomiting continues.  Special Instructions/Symptoms: Your throat may feel dry or sore from the anesthesia or the breathing tube placed in your throat during surgery. If this causes discomfort, gargle with warm salt water. The discomfort should disappear within 24 hours.  If you had a scopolamine patch placed behind your ear for the management of post- operative nausea and/or vomiting:  1. The medication in the patch is effective for 72 hours, after which it should be removed.  Wrap patch in a tissue and discard in the trash. Wash hands thoroughly with soap and water. 2. You may remove the patch earlier than 72 hours if you experience unpleasant side effects which may include dry mouth, dizziness or visual disturbances. 3. Avoid touching the patch. Wash your hands with soap and water after contact with the patch.

## 2018-11-22 NOTE — Op Note (Signed)
Procedure: 1.  Cystoscopy with removal of left double-J stent. 2.  Left retrograde pyelogram with interpretation. 3.  Left ureteroscopy with holmium laser application and insertion of left double-J stent.  Preop diagnosis: left lower pole renal stones.  Postop diagnosis: Same with stones in a calyx with infundibular stenosis.  Surgeon: Dr. Irine Seal.  Anesthesia: General.  Specimen: Stone fragments.  Drains: 7 [place] by 24 cm Polaris double-J stent.  EBL: 10 mL.  Complications: None.  Indications: Frank Ware is a 68 year old white male with a history of a left lower pole stone that is been treated twice with lithotripsy at another institution with minimal stone clearance.  He has had a stent in for 3 months and on CT scan he had persistent hydronephrosis with the stent and stones in the lower calyx.  After reviewing the options he will proceed with ureteroscopy today.  Procedure: He was taken operating room where a general anesthetic was induced.  He was given 2 g of Ancef.  He was placed in lithotomy position and fitted with PAS hose.  His perineum and genitalia were prepped with Betadine solution he was draped in usual sterile fashion.  Cystoscopy was performed using the 23 [place] scope and 30 degree lens.  Examination revealed a normal urethra.  The external sphincter was intact.  The prostatic urethra was approximately 2 to 3 cm in length with bilobar hyperplasia without significant obstruction.  Examination of bladder demonstrated a smooth wall but there was mucosal erythema from a left ureteral stent which was mildly encrusted the right ureteral orifice was unremarkable.  No stones or tumors were seen.  The stent was grasped with a grasping forceps and pulled to the urethral meatus.  With this maneuver the stent came out of the ureter so it was completely removed.  The cystoscope was reinserted and a 5 French opening catheter was inserted and contrast was instilled.  The retrograde  pyelogram demonstrated a fairly narrow ureter up to the UPJ where there was a small filling defect and some proximal dilation with clubbing of the calyces and the stone in the lower pole calyx.  A sensor guidewire was then passed through the opening catheter to the kidney without difficulty.  The inner core of a 55 cm 12/14 [place] digital access sheath was passed over the wire and while there was some mild resistance at the meatus it otherwise easily passed to the proximal ureter.  The assembled sheath was then passed once again with resistance only at the meatus.  Once the sheath was in position the inner core and wire were removed.  A dual-lumen digital flexible ureteroscope was then passed and the collecting system was inspected there were a few small stones in one lower calyx that were removed with the engage basket.  He had a fair amount of old clot it was aspirated out.  Eventually identified the calyx with the bulk of the stone in the lower pole and there was indeed infundibular stenosis with an obstructing stone in the infundibulum.  The 200 m tract tip laser was then passed initially with 0.8 J and 10 Hz and the stone was engaged.  I was able to fragment and removed the stone that was obstructing the infundibular canal and the laser was also used to incise this narrowed canal to open it more widely.  A larger stone was noted proximally and this was also engaged with the laser with the power increased to 0.5 J and eventually the frequency up to 53  Hz.  The stone was progressively fragmented into dust and fine particles.  But a portion of the stone was densely adherent to the calyceal wall and I was unable to tease it free despite efforts with multiple baskets and the laser and scope.  Eventually the urine became somewhat bloody and it was felt that further efforts would be counterproductive.  The stone fragments that had been left in the kidney were too small to readily retrieved with the basket.  So  the ureteroscope was removed after a wire was passed back through the scope to the kidney and the ureter was inspected as the sheath was removed.  There was some inflammatory change particular the proximal ureter with some squamous appearing metaplasia from chronic irritation and as result I felt a stent without tether was indicated.  The cystoscope was then reinserted over the wire and a 7 [place] by 24 cm Polaris stent without tether was passed the kidney under fluoroscopic guidance.  The wire was removed, leaving a good coil in the kidney and a good coil in the bladder.  The bladder was then drained and the cystoscope was removed.  He was taken down from lithotomy position, his anesthetic was reversed and he was moved to recovery in stable condition.  There were no complications.

## 2018-11-22 NOTE — Interval H&P Note (Signed)
No change in history

## 2018-11-22 NOTE — Anesthesia Procedure Notes (Signed)
Procedure Name: LMA Insertion Date/Time: 11/22/2018 7:39 AM Performed by: Garrel Ridgel, CRNA Pre-anesthesia Checklist: Patient identified, Emergency Drugs available, Suction available, Patient being monitored and Timeout performed Patient Re-evaluated:Patient Re-evaluated prior to induction Oxygen Delivery Method: Circle system utilized Preoxygenation: Pre-oxygenation with 100% oxygen Induction Type: IV induction Ventilation: Mask ventilation without difficulty LMA: LMA inserted LMA Size: 4.0 Number of attempts: 1 Placement Confirmation: positive ETCO2 Tube secured with: Tape Dental Injury: Teeth and Oropharynx as per pre-operative assessment

## 2018-11-22 NOTE — Transfer of Care (Signed)
Immediate Anesthesia Transfer of Care Note  Patient: Frank Ware  Procedure(s) Performed: CYSTOSCOPY LEFT URETEROSCOPY/HOLMIUM LASER/STENT EXCHANGE (Left Ureter)  Patient Location: PACU  Anesthesia Type:General  Level of Consciousness: awake, alert , oriented and patient cooperative  Airway & Oxygen Therapy: Patient Spontanous Breathing and Patient connected to face mask oxygen  Post-op Assessment: Report given to RN and Post -op Vital signs reviewed and stable  Post vital signs: Reviewed and stable  Last Vitals:  Vitals Value Taken Time  BP 131/85 11/22/18 0927  Temp    Pulse 103 11/22/18 0928  Resp 15 11/22/18 0928  SpO2 94 % 11/22/18 0928  Vitals shown include unvalidated device data.  Last Pain:  Vitals:   11/22/18 0602  TempSrc: Oral  PainSc: 6       Patients Stated Pain Goal: 5 ([id number])  Complications: No apparent anesthesia complications

## 2018-11-22 NOTE — Progress Notes (Signed)
Patient upset Dr. Jeffie Pollock did not order pain medication but referred him to his pain  provider.  Attempt to explain the process to patient and his wife but just not happy with the orders.

## 2018-11-23 ENCOUNTER — Encounter (HOSPITAL_BASED_OUTPATIENT_CLINIC_OR_DEPARTMENT_OTHER): Payer: Self-pay | Admitting: Urology

## 2018-11-23 DIAGNOSIS — N3090 Cystitis, unspecified without hematuria: Secondary | ICD-10-CM | POA: Diagnosis not present

## 2018-11-23 NOTE — Anesthesia Postprocedure Evaluation (Signed)
Anesthesia Post Note  Patient: Frank Ware  Procedure(s) Performed: CYSTOSCOPY LEFT URETEROSCOPY/HOLMIUM LASER/STENT EXCHANGE (Left Ureter)     Patient location during evaluation: PACU Anesthesia Type: General Level of consciousness: awake and alert Pain management: pain level controlled Vital Signs Assessment: post-procedure vital signs reviewed and stable Respiratory status: spontaneous breathing, nonlabored ventilation and respiratory function stable Cardiovascular status: blood pressure returned to baseline and stable Postop Assessment: no apparent nausea or vomiting Anesthetic complications: no    Last Vitals:  Vitals:   11/22/18 1045 11/22/18 1130  BP:  130/88  Pulse: 98 97  Resp: 17 17  Temp:  36.7 C  SpO2: 97% 97%    Last Pain:  Vitals:   11/22/18 1130  TempSrc:   PainSc: [place]

## 2018-12-03 ENCOUNTER — Other Ambulatory Visit: Payer: Self-pay | Admitting: Urology

## 2018-12-03 DIAGNOSIS — R310 Gross hematuria: Secondary | ICD-10-CM | POA: Diagnosis not present

## 2018-12-03 DIAGNOSIS — N200 Calculus of kidney: Secondary | ICD-10-CM | POA: Diagnosis not present

## 2018-12-04 DIAGNOSIS — E119 Type 2 diabetes mellitus without complications: Secondary | ICD-10-CM | POA: Diagnosis not present

## 2018-12-04 DIAGNOSIS — H524 Presbyopia: Secondary | ICD-10-CM | POA: Diagnosis not present

## 2018-12-18 ENCOUNTER — Other Ambulatory Visit: Payer: Self-pay

## 2018-12-18 ENCOUNTER — Encounter (HOSPITAL_BASED_OUTPATIENT_CLINIC_OR_DEPARTMENT_OTHER): Payer: Self-pay | Admitting: *Deleted

## 2018-12-18 NOTE — Progress Notes (Signed)
Spoke with patient via telephone for pre op interview. NPO after MN. Patient to take Norvasc, Atorvastatin AM of surgery, Zofran and Ultram if needed. Patient will need Istat 8 AM of surgery. Current EKG in Epic. Arrival time 0530.

## 2018-12-21 ENCOUNTER — Other Ambulatory Visit (HOSPITAL_COMMUNITY)
Admission: RE | Admit: 2018-12-21 | Discharge: 2018-12-21 | Disposition: A | Discharge status: Home / Self Care | Payer: Medicare Other | Source: Ambulatory Visit | Attending: Urology | Admitting: Urology

## 2018-12-21 DIAGNOSIS — Z20828 Contact with and (suspected) exposure to other viral communicable diseases: Secondary | ICD-10-CM | POA: Diagnosis not present

## 2018-12-21 DIAGNOSIS — Z01812 Encounter for preprocedural laboratory examination: Secondary | ICD-10-CM | POA: Diagnosis not present

## 2018-12-22 LAB — NOVEL CORONAVIRUS, NAA (HOSP ORDER, SEND-OUT TO REF LAB; TAT 18-24 HRS): SARS-CoV-2, NAA: NOT DETECTED

## 2018-12-24 NOTE — H&P (Signed)
CC/HPI: I have blood in my urine.     11/15/2018: Frank Ware is a 68 yo WM who is sent by Dr. [name] for hematuria. He saw Dr. [name] and had a left ureteral stent placed for an obstructing stone. He has subsequently had 2 lithotripsies and is scheduled for a 3rd in a couple of weeks. He still has the stent and he will have some left flank pain and pressure. He has some hematuria that has been present since August. He had a stone in the left kidney treated about 25 years ago and had ESWL that was unsuccessful and he thinks this is the same stone. He has had no history of UTI's.     CC: I have had kidney stone surgery.  HPI: Frank Ware is a 68 year-old male established patient who is here for renal calculi after a surgical intervention.  12/03/2018: Dr Jeffie Pollock took pt to the OR on 11/05 for URS to treat the collection of calculi in the lower pole of the kidney identified on CT imaging performed at last office visit. On initial inspection there were a few small stones in one of the lower calyx that were removed with the engage basket. A fair amount of old clot was also noted and aspirated out. Eventually the calyx with the bulk of the stone in the lower pole was identified with infundibular stenosis with an obstructing stone in the infundibulum. His urologist was able to fragment and remove the stone that was obstructing the infundibular canal as well as using the laser to incise the narrowed canal to make it more accessible. Most of the stone was progressively fragmented into dust and fine particles. A portion of the stone was too dense and adherent to the calyceal wall therefore unable to free it despite efforts with multiple baskets and the laser/scope. Procedure was terminated due to the urine becoming bloody and further efforts would be counterproductive. Stone fragments that had been left in the kidney were too small to readily retrieved with the basket so ureteroscope was removed and a ureteral stent was  replaced.   He returns today for follow-up exam.   Pt continues to tolerate his stent well. He's had some interval passage of sand like material with associated mild increase in LUTs. He initially was having gross hematuria but that has cleared over the past week. No exacerbations of colic pain, he has been using pain medication prescribed by his pain management physician as well as Dr Jeffie Pollock with good control of his symptoms. Denies f/c, n/v post-operatively.   The stone was on the left side. He had Stent and Ureteroscopy for treatment of his renal calculi. Patient denies ESWL and PCNL. This procedure was done 11/22/2018. He did pass a stone since the last office visit . This is not his first kidney stone. He does have a stent in place.   He is currently having flank pain and back pain. He denies having groin pain, nausea, vomiting, fever, and chills.   He does have dysuria. He does have urgency.     ALLERGIES: None   MEDICATIONS: Hydrochlorothiazide 25 mg tablet  Metformin Hcl 1,000 mg tablet  Amlodipine Besylate 2.5 mg tablet  Atorvastatin Calcium 20 mg tablet  Belbuca  Buprenorphine Hcl  Glipizide 10 mg tablet  Hydrocodone-Acetaminophen 5 mg-325 mg tablet 1-2 tablet PO Q 6 H PRN  Januvia 100 mg tablet  Meclizine Hcl 25 mg tablet  Olmesartan Medoxomil 40 mg tablet  Ondansetron Hcl 8 mg tablet  Pioglitazone Hcl 45 mg tablet  Tramadol Hcl 50 mg tablet  Valsartan 160 mg tablet     GU PSH: Cystoscopy Insert Stent, Left - about 09/06/2018 ESWL - 09/27/2018, Left - 09/14/2018       PSH Notes: cervical fusion     NON-GU PSH: Knee replacement, Right     GU PMH: Gross hematuria, Urine culture sent. Hematuria at this point is likely related to the stent. I will treat if the culture is positive. - 11/15/2018 Renal calculus, Left, His stone is well fragmented in the LLP and I don't think further ESWL is indicated. I discussed ureteroscopy vs PCNL and since he has the stent in place,  ureteroscopy would be my recommendation and he is agreeable to that. I have reviewed the risks of ureteroscopy including bleeding, infection, ureteral injury, need for a stent or secondary procedures, thrombotic events and anesthetic complications. - 11/15/2018    NON-GU PMH: Diabetes Type 2 Hypercholesterolemia Hypertension    FAMILY HISTORY: Kidney Stones - Mother   SOCIAL HISTORY: Marital Status: Married Preferred Language: English; Race: White Current Smoking Status: Patient has never smoked.   Tobacco Use Assessment Completed: Used Tobacco in last 30 days? Patient's occupation is/was Retired from [employer]..     Notes: 3 sons, 2 daughters    REVIEW OF SYSTEMS:    GU Review Male:   Patient reports frequent urination, hard to postpone urination, burning/ pain with urination, and get up at night to urinate. Patient denies leakage of urine, stream starts and stops, trouble starting your stream, have to strain to urinate , erection problems, and penile pain.  Gastrointestinal (Upper):   Patient denies nausea, vomiting, and indigestion/ heartburn.  Gastrointestinal (Lower):   Patient denies diarrhea and constipation.  Constitutional:   Patient denies fever, night sweats, weight loss, and fatigue.  Skin:   Patient denies skin rash/ lesion and itching.  Eyes:   Patient denies blurred vision and double vision.  Ears/ Nose/ Throat:   Patient denies sinus problems and sore throat.  Hematologic/Lymphatic:   Patient denies swollen glands and easy bruising.  Cardiovascular:   Patient denies leg swelling and chest pains.  Respiratory:   Patient denies cough and shortness of breath.  Endocrine:   Patient denies excessive thirst.  Musculoskeletal:   Patient reports back pain. Patient denies joint pain.  Neurological:   Patient denies headaches and dizziness.  Psychologic:   Patient denies depression and anxiety.   VITAL SIGNS:      12/03/2018 11:28 AM  Weight 227.5 lb / 103.19 kg  Height  65 in / 165.1 cm  BP 94/63 mmHg  Pulse 92 /min  Temperature 98.0 F / 36.6 C  BMI 37.9 kg/m   MULTI-SYSTEM PHYSICAL EXAMINATION:    Constitutional: Obese. No physical deformities. Normally developed. Good grooming.   Respiratory: Normal breath sounds. No labored breathing, no use of accessory muscles.   Cardiovascular: Regular rate and rhythm. No murmur, no gallop. Normal temperature, normal extremity pulses, no swelling, no varicosities.   Skin: No paleness, no jaundice, no cyanosis. No lesion, no ulcer, no rash.  Neurologic / Psychiatric: Oriented to time, oriented to place, oriented to person. No depression, no anxiety, no agitation.  Gastrointestinal: Obese abdomen. No mass, no tenderness, no rigidity.   Musculoskeletal: Normal gait and station of head and neck.     PAST DATA REVIEWED:  Source Of History:  Patient, Medical Record Summary  Records Review:   Previous Hospital Records, Previous Patient Records  Urine  Test Review:   Urinalysis, Urine Culture  X-Ray Review: KUB: Reviewed Films. Discussed With Patient.  C.T. Stone Protocol: Reviewed Films. Reviewed Report.     12/03/18  Urinalysis  Urine Appearance Slightly Cloudy   Urine Color Yellow   Urine Glucose Neg mg/dL  Urine Bilirubin Neg mg/dL  Urine Ketones Neg mg/dL  Urine Specific Gravity 1.030   Urine Blood 3+ ery/uL  Urine pH 5.5   Urine Protein 3+ mg/dL  Urine Urobilinogen 0.2 mg/dL  Urine Nitrites Neg   Urine Leukocyte Esterase 3+ leu/uL  Urine WBC/hpf 40 - 60/hpf   Urine RBC/hpf >60/hpf   Urine Epithelial Cells 0 - 5/hpf   Urine Bacteria Many (>50/hpf)   Urine Mucous Present   Urine Yeast NS (Not Seen)   Urine Trichomonas Not Present   Urine Cystals NS (Not Seen)   Urine Casts Hyaline   Urine Sperm Not Present    PROCEDURES:         KUB [id number]  A single view of the abdomen is obtained. Left renal shadow visualized. There is a collection of stone fragments in the LLP measuring 12.5 x 15.81mm. This was  the original collection of stone material treated. Medial to this there is a collection of stone material measuring 8.7 x 13.61mm that may represent additional stone fragments that were 'dusted'/treated during his ureteroscopy. No obvious ureteral calculi were identified tracing along the course of the left ureter into the bladder.   Ureteral Stent:  In position ureteral stent. Left ureteral stent.      . Patient confirmed No Neulasta OnPro Device.           Urinalysis w/Scope Dipstick Dipstick Cont'd Micro  Color: Yellow Bilirubin: Neg mg/dL WBC/hpf: 40 - 60/hpf  Appearance: Slightly Cloudy Ketones: Neg mg/dL RBC/hpf: >60/hpf  Specific Gravity: 1.030 Blood: 3+ ery/uL Bacteria: Many (>50/hpf)  pH: 5.5 Protein: 3+ mg/dL Cystals: NS (Not Seen)  Glucose: Neg mg/dL Urobilinogen: 0.2 mg/dL Casts: Hyaline    Nitrites: Neg Trichomonas: Not Present    Leukocyte Esterase: 3+ leu/uL Mucous: Present      Epithelial Cells: 0 - 5/hpf      Yeast: NS (Not Seen)      Sperm: Not Present    ASSESSMENT:      ICD-10 Details  1 GU:   Renal calculus - N20.0 Left  2   Gross hematuria - R31.0 Improving   PLAN:           Orders Labs Urine Culture  X-Rays: KUB          Schedule Return Visit/Planned Activity: Next Available Appointment - Schedule Surgery, Follow up MD          Document Letter(s):  Created for Patient: Clinical Summary         Notes:   Two distinct areas of stone material remain in the lower pole region of his left kidney. I reviewed imaging with Dr Jeffie Pollock today. Recommend scheduling patient for repeat ureteroscopy to further decrease his residual stone burden. I reviewed the risks of repeat ureteroscopy including bleeding, infection, ureteral injury, need for a stent or secondary procedures, thrombotic events and anesthetic complications. Pt agreeable with this plan of care and elects to proceed. I'll fill out paper work and turn into the scheduler who will contact the patient in the  near future to set this up. Repeat urine c/s sent today. Pt instructed to contact the office in the event he develops worsening symptoms including renal colic, painful inability  to void and correlating f/c, n/v.

## 2018-12-25 ENCOUNTER — Ambulatory Visit (HOSPITAL_BASED_OUTPATIENT_CLINIC_OR_DEPARTMENT_OTHER): Payer: Medicare Other | Admitting: Anesthesiology

## 2018-12-25 ENCOUNTER — Encounter (HOSPITAL_BASED_OUTPATIENT_CLINIC_OR_DEPARTMENT_OTHER): Payer: Self-pay | Admitting: Urology

## 2018-12-25 ENCOUNTER — Encounter (HOSPITAL_BASED_OUTPATIENT_CLINIC_OR_DEPARTMENT_OTHER)
Admission: RE | Disposition: A | Discharge status: Home / Self Care | Payer: Self-pay | Source: Home / Self Care | Attending: Urology

## 2018-12-25 ENCOUNTER — Ambulatory Visit (HOSPITAL_BASED_OUTPATIENT_CLINIC_OR_DEPARTMENT_OTHER)
Admission: RE | Admit: 2018-12-25 | Discharge: 2018-12-25 | Disposition: A | Discharge status: Home / Self Care | Payer: Medicare Other | Attending: Urology | Admitting: Urology

## 2018-12-25 ENCOUNTER — Other Ambulatory Visit: Payer: Self-pay

## 2018-12-25 DIAGNOSIS — I10 Essential (primary) hypertension: Secondary | ICD-10-CM | POA: Diagnosis not present

## 2018-12-25 DIAGNOSIS — R310 Gross hematuria: Secondary | ICD-10-CM | POA: Insufficient documentation

## 2018-12-25 DIAGNOSIS — Z79899 Other long term (current) drug therapy: Secondary | ICD-10-CM | POA: Insufficient documentation

## 2018-12-25 DIAGNOSIS — Z6835 Body mass index (BMI) 35.0-35.9, adult: Secondary | ICD-10-CM | POA: Diagnosis not present

## 2018-12-25 DIAGNOSIS — Z87442 Personal history of urinary calculi: Secondary | ICD-10-CM | POA: Insufficient documentation

## 2018-12-25 DIAGNOSIS — E7800 Pure hypercholesterolemia, unspecified: Secondary | ICD-10-CM | POA: Insufficient documentation

## 2018-12-25 DIAGNOSIS — Z981 Arthrodesis status: Secondary | ICD-10-CM | POA: Diagnosis not present

## 2018-12-25 DIAGNOSIS — E669 Obesity, unspecified: Secondary | ICD-10-CM | POA: Diagnosis not present

## 2018-12-25 DIAGNOSIS — Z841 Family history of disorders of kidney and ureter: Secondary | ICD-10-CM | POA: Diagnosis not present

## 2018-12-25 DIAGNOSIS — E118 Type 2 diabetes mellitus with unspecified complications: Secondary | ICD-10-CM | POA: Diagnosis not present

## 2018-12-25 DIAGNOSIS — Z96651 Presence of right artificial knee joint: Secondary | ICD-10-CM | POA: Diagnosis not present

## 2018-12-25 DIAGNOSIS — Z7984 Long term (current) use of oral hypoglycemic drugs: Secondary | ICD-10-CM | POA: Insufficient documentation

## 2018-12-25 DIAGNOSIS — N200 Calculus of kidney: Secondary | ICD-10-CM | POA: Diagnosis not present

## 2018-12-25 DIAGNOSIS — E119 Type 2 diabetes mellitus without complications: Secondary | ICD-10-CM | POA: Diagnosis not present

## 2018-12-25 HISTORY — DX: Pure hypercholesterolemia, unspecified: E78.00

## 2018-12-25 HISTORY — PX: CYSTOSCOPY/URETEROSCOPY/HOLMIUM LASER/STENT PLACEMENT: SHX6546

## 2018-12-25 LAB — POCT I-STAT, CHEM 8
BUN: 24 mg/dL — ABNORMAL HIGH (ref 8–23)
Calcium, Ion: 1.32 mmol/L (ref 1.15–1.40)
Chloride: 102 mmol/L (ref 98–111)
Creatinine, Ser: 0.9 mg/dL (ref 0.61–1.24)
Glucose, Bld: 108 mg/dL — ABNORMAL HIGH (ref 70–99)
HCT: 38 % — ABNORMAL LOW (ref 39.0–52.0)
Hemoglobin: 12.9 g/dL — ABNORMAL LOW (ref 13.0–17.0)
Potassium: 4 mmol/L (ref 3.5–5.1)
Sodium: 138 mmol/L (ref 135–145)
TCO2: 25 mmol/L (ref 22–32)

## 2018-12-25 LAB — GLUCOSE, CAPILLARY: Glucose-Capillary: 142 mg/dL — ABNORMAL HIGH (ref 70–99)

## 2018-12-25 SURGERY — CYSTOSCOPY/URETEROSCOPY/HOLMIUM LASER/STENT PLACEMENT
Anesthesia: General | Site: Ureter | Laterality: Left

## 2018-12-25 MED ORDER — ACETAMINOPHEN 325 MG PO TABS
650.0000 mg | ORAL_TABLET | ORAL | Status: DC | PRN
  Filled 2018-12-25: qty 2

## 2018-12-25 MED ORDER — ONDANSETRON HCL 4 MG/2ML IJ SOLN
INTRAMUSCULAR | Status: AC
  Filled 2018-12-25: qty 2

## 2018-12-25 MED ORDER — ACETAMINOPHEN 10 MG/ML IV SOLN
INTRAVENOUS | Status: DC | PRN
  Administered 2018-12-25: 1000 mg via INTRAVENOUS

## 2018-12-25 MED ORDER — HYDROMORPHONE HCL 1 MG/ML IJ SOLN
0.5000 mg | INTRAMUSCULAR | Status: DC | PRN
  Administered 2018-12-25 (×3): 0.5 mg via INTRAVENOUS
  Filled 2018-12-25: qty 0.5

## 2018-12-25 MED ORDER — LIDOCAINE 2% (20 MG/ML) 5 ML SYRINGE
INTRAMUSCULAR | Status: AC
  Filled 2018-12-25: qty 5

## 2018-12-25 MED ORDER — ONDANSETRON HCL 4 MG/2ML IJ SOLN
INTRAMUSCULAR | Status: DC | PRN
  Administered 2018-12-25: 4 mg via INTRAVENOUS

## 2018-12-25 MED ORDER — SODIUM CHLORIDE 0.9% FLUSH
3.0000 mL | INTRAVENOUS | Status: DC | PRN
  Filled 2018-12-25: qty 3

## 2018-12-25 MED ORDER — SUCCINYLCHOLINE CHLORIDE 200 MG/10ML IV SOSY
PREFILLED_SYRINGE | INTRAVENOUS | Status: AC
  Filled 2018-12-25: qty 10

## 2018-12-25 MED ORDER — DEXAMETHASONE SODIUM PHOSPHATE 10 MG/ML IJ SOLN
INTRAMUSCULAR | Status: AC
  Filled 2018-12-25: qty 1

## 2018-12-25 MED ORDER — ROCURONIUM BROMIDE 100 MG/10ML IV SOLN
INTRAVENOUS | Status: DC | PRN
  Administered 2018-12-25: 10 mg via INTRAVENOUS
  Administered 2018-12-25: 30 mg via INTRAVENOUS

## 2018-12-25 MED ORDER — LACTATED RINGERS IV SOLN
INTRAVENOUS | Status: DC
  Filled 2018-12-25: qty 1000

## 2018-12-25 MED ORDER — FENTANYL CITRATE (PF) 100 MCG/2ML IJ SOLN
INTRAMUSCULAR | Status: AC
  Filled 2018-12-25: qty 2

## 2018-12-25 MED ORDER — HYDROMORPHONE HCL 1 MG/ML IJ SOLN
INTRAMUSCULAR | Status: AC
  Filled 2018-12-25: qty 1

## 2018-12-25 MED ORDER — SODIUM CHLORIDE 0.9 % IV SOLN
250.0000 mL | INTRAVENOUS | Status: DC | PRN
  Filled 2018-12-25: qty 250

## 2018-12-25 MED ORDER — SODIUM CHLORIDE 0.9 % IR SOLN
Status: DC | PRN
  Administered 2018-12-25: 3000 mL via INTRAVESICAL

## 2018-12-25 MED ORDER — MEPERIDINE HCL 25 MG/ML IJ SOLN
6.2500 mg | INTRAMUSCULAR | Status: DC | PRN
  Filled 2018-12-25: qty 1

## 2018-12-25 MED ORDER — MIDAZOLAM HCL 5 MG/5ML IJ SOLN
INTRAMUSCULAR | Status: DC | PRN
  Administered 2018-12-25: 2 mg via INTRAVENOUS

## 2018-12-25 MED ORDER — FENTANYL CITRATE (PF) 100 MCG/2ML IJ SOLN
INTRAMUSCULAR | Status: DC | PRN
  Administered 2018-12-25: 50 ug via INTRAVENOUS
  Administered 2018-12-25: 25 ug via INTRAVENOUS
  Administered 2018-12-25: 50 ug via INTRAVENOUS
  Administered 2018-12-25 (×5): 25 ug via INTRAVENOUS
  Administered 2018-12-25: 50 ug via INTRAVENOUS
  Administered 2018-12-25 (×2): 25 ug via INTRAVENOUS
  Administered 2018-12-25: 50 ug via INTRAVENOUS

## 2018-12-25 MED ORDER — OXYCODONE HCL 5 MG PO TABS
5.0000 mg | ORAL_TABLET | ORAL | Status: DC | PRN
  Filled 2018-12-25: qty 2

## 2018-12-25 MED ORDER — LIDOCAINE 2% (20 MG/ML) 5 ML SYRINGE
INTRAMUSCULAR | Status: DC | PRN
  Administered 2018-12-25: 80 mg via INTRAVENOUS

## 2018-12-25 MED ORDER — KETOROLAC TROMETHAMINE 30 MG/ML IJ SOLN
30.0000 mg | Freq: Once | INTRAMUSCULAR | Status: AC
  Administered 2018-12-25: 30 mg via INTRAVENOUS
  Filled 2018-12-25: qty 1

## 2018-12-25 MED ORDER — CEFAZOLIN SODIUM-DEXTROSE 2-4 GM/100ML-% IV SOLN
2.0000 g | INTRAVENOUS | Status: AC
  Administered 2018-12-25: 2 g via INTRAVENOUS
  Filled 2018-12-25: qty 100

## 2018-12-25 MED ORDER — LACTATED RINGERS IV SOLN
INTRAVENOUS | Status: DC
  Filled 2018-12-25 (×2): qty 1000

## 2018-12-25 MED ORDER — KETOROLAC TROMETHAMINE 30 MG/ML IJ SOLN
INTRAMUSCULAR | Status: AC
  Filled 2018-12-25: qty 1

## 2018-12-25 MED ORDER — SODIUM CHLORIDE 0.9% FLUSH
3.0000 mL | Freq: Two times a day (BID) | INTRAVENOUS | Status: DC
  Filled 2018-12-25: qty 3

## 2018-12-25 MED ORDER — ACETAMINOPHEN 650 MG RE SUPP
650.0000 mg | RECTAL | Status: DC | PRN
  Filled 2018-12-25: qty 1

## 2018-12-25 MED ORDER — PROPOFOL 10 MG/ML IV BOLUS
INTRAVENOUS | Status: AC
  Filled 2018-12-25: qty 40

## 2018-12-25 MED ORDER — PROPOFOL 10 MG/ML IV BOLUS
INTRAVENOUS | Status: DC | PRN
  Administered 2018-12-25: 100 mg via INTRAVENOUS
  Administered 2018-12-25: 200 mg via INTRAVENOUS

## 2018-12-25 MED ORDER — ROCURONIUM BROMIDE 10 MG/ML (PF) SYRINGE
PREFILLED_SYRINGE | INTRAVENOUS | Status: AC
  Filled 2018-12-25: qty 10

## 2018-12-25 MED ORDER — FENTANYL CITRATE (PF) 100 MCG/2ML IJ SOLN
25.0000 ug | INTRAMUSCULAR | Status: DC | PRN
  Administered 2018-12-25: 25 ug via INTRAVENOUS
  Filled 2018-12-25: qty 1

## 2018-12-25 MED ORDER — CEFAZOLIN SODIUM-DEXTROSE 2-4 GM/100ML-% IV SOLN
INTRAVENOUS | Status: AC
  Filled 2018-12-25: qty 100

## 2018-12-25 MED ORDER — SUCCINYLCHOLINE CHLORIDE 20 MG/ML IJ SOLN
INTRAMUSCULAR | Status: DC | PRN
  Administered 2018-12-25: 100 mg via INTRAVENOUS

## 2018-12-25 MED ORDER — SUGAMMADEX SODIUM 200 MG/2ML IV SOLN
INTRAVENOUS | Status: DC | PRN
  Administered 2018-12-25: 200 mg via INTRAVENOUS

## 2018-12-25 MED ORDER — METOCLOPRAMIDE HCL 5 MG/ML IJ SOLN
10.0000 mg | Freq: Once | INTRAMUSCULAR | Status: DC | PRN
  Filled 2018-12-25: qty 2

## 2018-12-25 MED ORDER — MIDAZOLAM HCL 2 MG/2ML IJ SOLN
INTRAMUSCULAR | Status: AC
  Filled 2018-12-25: qty 2

## 2018-12-25 MED ORDER — DEXAMETHASONE SODIUM PHOSPHATE 4 MG/ML IJ SOLN
INTRAMUSCULAR | Status: DC | PRN
  Administered 2018-12-25: 10 mg via INTRAVENOUS

## 2018-12-25 MED ORDER — MORPHINE SULFATE (PF) 2 MG/ML IV SOLN
2.0000 mg | INTRAVENOUS | Status: DC | PRN
  Filled 2018-12-25: qty 1

## 2018-12-25 SURGICAL SUPPLY — 29 items
BAG DRAIN URO-CYSTO SKYTR STRL (DRAIN) ×2 IMPLANT
BASKET STONE 1.7 NGAGE (UROLOGICAL SUPPLIES) ×2 IMPLANT
BASKET STONE NCOMPASS (UROLOGICAL SUPPLIES) ×2 IMPLANT
BASKET ZERO TIP NITINOL 2.4FR (BASKET) IMPLANT
CATH URET 5FR 28IN CONE TIP (BALLOONS)
CATH URET 5FR 28IN OPEN ENDED (CATHETERS) IMPLANT
CATH URET 5FR 70CM CONE TIP (BALLOONS) IMPLANT
CLOTH BEACON ORANGE TIMEOUT ST (SAFETY) ×2 IMPLANT
ELECT REM PT RETURN 9FT ADLT (ELECTROSURGICAL)
ELECTRODE REM PT RTRN 9FT ADLT (ELECTROSURGICAL) IMPLANT
FIBER LASER FLEXIVA 200 (UROLOGICAL SUPPLIES) ×2 IMPLANT
FIBER LASER FLEXIVA 365 (UROLOGICAL SUPPLIES) IMPLANT
FIBER LASER TRAC TIP (UROLOGICAL SUPPLIES) IMPLANT
GLOVE BIO SURGEON STRL SZ 6.5 (GLOVE) ×2 IMPLANT
GLOVE BIOGEL PI IND STRL 6.5 (GLOVE) ×2 IMPLANT
GLOVE BIOGEL PI INDICATOR 6.5 (GLOVE) ×2
GLOVE SURG SS PI 8.0 STRL IVOR (GLOVE) ×2 IMPLANT
GOWN STRL REUS W/TWL XL LVL3 (GOWN DISPOSABLE) ×4 IMPLANT
GUIDEWIRE ANG ZIPWIRE 038X150 (WIRE) IMPLANT
GUIDEWIRE STR DUAL SENSOR (WIRE) ×2 IMPLANT
IV NS IRRIG 3000ML ARTHROMATIC (IV SOLUTION) ×2 IMPLANT
KIT TURNOVER CYSTO (KITS) ×2 IMPLANT
MANIFOLD NEPTUNE II (INSTRUMENTS) ×2 IMPLANT
NS IRRIG 500ML POUR BTL (IV SOLUTION) ×2 IMPLANT
PACK CYSTO (CUSTOM PROCEDURE TRAY) ×2 IMPLANT
SHEATH URETERAL 12FRX35CM (MISCELLANEOUS) ×2 IMPLANT
STENT POLARIS LOOP 7FR X 24 CM (STENTS) ×2 IMPLANT
TUBE CONNECTING 12X1/4 (SUCTIONS) ×2 IMPLANT
TUBING UROLOGY SET (TUBING) ×2 IMPLANT

## 2018-12-25 NOTE — Op Note (Signed)
Procedure: 1.  Cystoscopy with removal of left double-J stent. 2.  Left ureteroscopy with holmium laser application, stone retrieval and insertion of left double-J stent.  Preop diagnosis: Left lower pole renal stones and retained stent.  Postop diagnosis: Same.  Surgeon: Dr. Irine Seal.  Anesthesia: General.  Specimen: Stone fragments.  Drains: 6 [place] by 24 cm Polaris left double-J stent with tether.  EBL: Less than 5 ml.  Complications: None.  Indications: The patient is a 68 year old male with a history of a left lower pole renal stone with a portion of it and a calyx with a stenotic infundibulum.  An outside institution he had previously undergone placement of left double-J stent and 2 prior lithotripsies with minimal stone clearance.  I had taken the operating room approximately 2 weeks ago and performed ureteroscopy with incision of the stenotic infundibulum and fragmentation and removal of several stones however he had a significant residual stone burden it could not be entirely cleared because of bleeding at the time of the initial procedure so a stent was replaced and he returns now for subsequent stone removal.  Procedure: He was given 2 g of Ancef.  A general anesthetic was induced.  He was placed in lithotomy position and fitted with PAS hose.  His perineum and genitalia were prepped with Betadine solution he was draped in usual sterile fashion.  Cystoscopy was performed with the 23 [place] scope and 30 degree lens.  Examination revealed a normal urethra.  The external sphincter was intact.  The prostatic urethra was short with bilobar hyperplasia with minimal obstruction.  Examination of bladder revealed no mucosal lesions other than irritation around the left ureteral orifice from the stent that was in place.  The right ureteral orifice was unremarkable.  The stent was grasped with a grasping forceps and pulled the urethral meatus.  A sensor guidewire was then passed to the  kidney under fluoroscopic guidance through the stent and the stent was removed.  The inner core of a 35 cm 12/14 [place] digital access sheath was passed over the wire and easily advanced to the level of the renal pelvis.  The sheath was then assembled and inserted and once again passed without difficulty.  The inner core and wire were then removed.  A dual-lumen digital flexible ureteroscope was then passed through the sheath to the renal collecting system.  Inspection of the lower pole revealed 1 calyx with several small stone fragments.  A significant amount of these stones were removed with both the engage basket and the encompass basket's.  I then turned my attention to the stone in the calyx with the prior infundibular stenosis.  Several stone fragments were removed from this area with the engage and encompass baskets.  I then used the 200 m holmium laser fiber initially set on 0.5 J and 53 Hz with the power increased to 1 J and 53 Hz as needed.  I had I switched to the single-lumen digital flexible scope to aid mobility at the tip to better apply the laser.  I was able to partially fragment the the larger residual fragment but it was quite adherent to the wall of the calyx and I could not tease it out or bring the laser to bear in a fashion that would completely fragment the stone.  Final inspection demonstrated some residual grit in the collecting system that was too small to removed with the baskets and the stone material adherent in the lower calyx.  His overall stone burden  had been reduced by approximately 95% compared to his initial procedure.  At this point the ureteroscope was removed and the guidewire was then passed through the sheath to the kidney.  The sheath was removed and the cystoscope was reinserted over the guidewire into the bladder.  A fresh 6 [place] by 24 cm Polaris stent was passed over the wire under fluoroscopic guidance.  The wire was removed leaving good coil in the kidney and  the stent loops in the bladder.  The bladder was drained and the cystoscope was removed leaving the stent string exiting urethra.  The string was secured to the patient's penis.  He was taken down from lithotomy position, his anesthetic was reversed and he was moved to recovery in stable condition.  There were no complications.  The stone fragments were given to the family.

## 2018-12-25 NOTE — Anesthesia Procedure Notes (Signed)
Procedure Name: LMA Insertion Date/Time: 12/25/2018 7:40 AM Performed by: Justice Rocher, CRNA Pre-anesthesia Checklist: Patient identified, Emergency Drugs available, Suction available and Patient being monitored Patient Re-evaluated:Patient Re-evaluated prior to induction Oxygen Delivery Method: Circle system utilized Preoxygenation: Pre-oxygenation with 100% oxygen Induction Type: IV induction Ventilation: Mask ventilation without difficulty LMA: LMA inserted LMA Size: 5.0 Number of attempts: 1 Airway Equipment and Method: Bite block Placement Confirmation: positive ETCO2 and breath sounds checked- equal and bilateral Tube secured with: Tape Dental Injury: Teeth and Oropharynx as per pre-operative assessment

## 2018-12-25 NOTE — Anesthesia Preprocedure Evaluation (Signed)
Anesthesia Evaluation  Patient identified by MRN, date of birth, ID band Patient awake    Reviewed: Allergy & Precautions, NPO status , Patient's Chart, lab work & pertinent test results  History of Anesthesia Complications Negative for: history of anesthetic complications  Airway Mallampati: II  TM Distance: >3 FB Neck ROM: Full    Dental  (+) Dental Advisory Given, Teeth Intact   Pulmonary neg pulmonary ROS,    Pulmonary exam normal        Cardiovascular hypertension, Pt. on medications Normal cardiovascular exam     Neuro/Psych negative neurological ROS  negative psych ROS   GI/Hepatic negative GI ROS, Neg liver ROS,   Endo/Other  diabetes, Type 2, Oral Hypoglycemic Agents Obesity   Renal/GU  Kidney stones      Musculoskeletal negative musculoskeletal ROS (+)   Abdominal   Peds  Hematology negative hematology ROS (+)   Anesthesia Other Findings   Reproductive/Obstetrics                             Anesthesia Physical  Anesthesia Plan  ASA: II  Anesthesia Plan: General   Post-op Pain Management:    Induction: Intravenous  PONV Risk Score and Plan: 2 and Treatment may vary due to age or medical condition, Ondansetron and Dexamethasone  Airway Management Planned: LMA  Additional Equipment: None  Intra-op Plan:   Post-operative Plan:   Informed Consent: I have reviewed the patients History and Physical, chart, labs and discussed the procedure including the risks, benefits and alternatives for the proposed anesthesia with the patient or authorized representative who has indicated his/her understanding and acceptance.     Dental advisory given  Plan Discussed with: CRNA and Anesthesiologist  Anesthesia Plan Comments:         Anesthesia Quick Evaluation

## 2018-12-25 NOTE — Discharge Instructions (Signed)
Ureteral Stent Implantation, Care After This sheet gives you information about how to care for yourself after your procedure. Your health care provider may also give you more specific instructions. If you have problems or questions, contact your health care provider. What can I expect after the procedure? After the procedure, it is common to have:  Nausea.  Mild pain when you urinate. You may feel this pain in your lower back or lower abdomen. The pain should stop within a few minutes after you urinate. This may last for up to 1 week.  A small amount of blood in your urine for several days. Follow these instructions at home: Medicines  Take over-the-counter and prescription medicines only as told by your health care provider.  If you were prescribed an antibiotic medicine, take it as told by your health care provider. Do not stop taking the antibiotic even if you start to feel better.  Do not drive for 24 hours if you were given a sedative during your procedure.  Ask your health care provider if the medicine prescribed to you requires you to avoid driving or using heavy machinery. Activity  Rest as told by your health care provider.  Avoid sitting for a long time without moving. Get up to take short walks every 1-2 hours. This is important to improve blood flow and breathing. Ask for help if you feel weak or unsteady.  Return to your normal activities as told by your health care provider. Ask your health care provider what activities are safe for you. General instructions   Watch for any blood in your urine. Call your health care provider if the amount of blood in your urine increases.  If you have a catheter: ? Follow instructions from your health care provider about taking care of your catheter and collection bag. ? Do not take baths, swim, or use a hot tub until your health care provider approves. Ask your health care provider if you may take showers. You may only be allowed to  take sponge baths.  Drink enough fluid to keep your urine pale yellow.  Do not use any products that contain nicotine or tobacco, such as cigarettes, e-cigarettes, and chewing tobacco. These can delay healing after surgery. If you need help quitting, ask your health care provider.  Keep all follow-up visits as told by your health care provider. This is important. Contact a health care provider if:  You have pain that gets worse or does not get better with medicine, especially pain when you urinate.  You have difficulty urinating.  You feel nauseous or you vomit repeatedly during a period of more than 2 days after the procedure. Get help right away if:  Your urine is dark red or has blood clots in it.  You are leaking urine (have incontinence).  The end of the stent comes out of your urethra.  You cannot urinate.  You have sudden, sharp, or severe pain in your abdomen or lower back.  You have a fever.  You have swelling or pain in your legs.  You have difficulty breathing. Summary  After the procedure, it is common to have mild pain when you urinate that goes away within a few minutes after you urinate. This may last for up to 1 week.  Watch for any blood in your urine. Call your health care provider if the amount of blood in your urine increases.  Take over-the-counter and prescription medicines only as told by your health care provider.  Drink   enough fluid to keep your urine pale yellow.  You may remove the stent on Friday morning by pulling the attached string.  If you don't feel comfortable doing that, please call the office to come in to have it removed, or you can wait until your follow up appointment next week.  This information is not intended to replace advice given to you by your health care provider. Make sure you discuss any questions you have with your health care provider. Document Released: 08/29/2012 Document Revised: 10/03/2017 Document Reviewed:  10/04/2017 Elsevier Patient Education  2020 [place] INSTRUCTIONS  Activity: Rest for the remainder of the day.  Do not drive or operate equipment today.  You may resume normal activities in one to two days as instructed by your physician.   Meals: Drink plenty of liquids and eat light foods such as gelatin or soup this evening.  You may return to a normal meal plan tomorrow.  Return to Work: You may return to work in one to two days or as instructed by your physician.  Special Instructions / Symptoms: Call your physician if any of these symptoms occur:   -persistent or heavy bleeding  -bleeding which continues after first few urination  -large blood clots that are difficult to pass  -urine stream diminishes or stops completely  -fever equal to or higher than 101 degrees Farenheit.  -cloudy urine with a strong, foul odor  -severe pain  Females should always wipe from front to back after elimination.  You may feel some burning pain when you urinate.  This should disappear with time.  Applying moist heat to the lower abdomen or a hot tub bath may help relieve the pain. \  Follow-Up / Date of Return Visit to Your Physician: as instructed Post Anesthesia Home Care Instructions  Activity: Get plenty of rest for the remainder of the day. A responsible individual must stay with you for 24 hours following the procedure.  For the next 24 hours, DO NOT: -Drive a car -Paediatric nurse -Drink alcoholic beverages -Take any medication unless instructed by your physician -Make any legal decisions or sign important papers.  Meals: Start with liquid foods such as gelatin or soup. Progress to regular foods as tolerated. Avoid greasy, spicy, heavy foods. If nausea and/or vomiting occur, drink only clear liquids until the nausea and/or vomiting subsides. Call your physician if vomiting continues.  Special Instructions/Symptoms: Your throat may feel dry or sore from  the anesthesia or the breathing tube placed in your throat during surgery. If this causes discomfort, gargle with warm salt water. The discomfort should disappear within 24 hours.  If you had a scopolamine patch placed behind your ear for the management of post- operative nausea and/or vomiting:  1. The medication in the patch is effective for 72 hours, after which it should be removed.  Wrap patch in a tissue and discard in the trash. Wash hands thoroughly with soap and water. 2. You may remove the patch earlier than 72 hours if you experience unpleasant side effects which may include dry mouth, dizziness or visual disturbances. 3. Avoid touching the patch. Wash your hands with soap and water after contact with the patch.     Call for an appointment to arrange follow-up.  Patient Signature:  ________________________________________________________  Nurse's Signature:  ________________________________________________________

## 2018-12-25 NOTE — Interval H&P Note (Signed)
No change.  He continues to have pain.

## 2018-12-25 NOTE — Anesthesia Procedure Notes (Signed)
Procedure Name: Intubation Date/Time: 12/25/2018 8:12 AM Performed by: Justice Rocher, CRNA Pre-anesthesia Checklist: Patient identified, Emergency Drugs available, Suction available and Patient being monitored Patient Re-evaluated:Patient Re-evaluated prior to induction Oxygen Delivery Method: Circle system utilized Preoxygenation: Pre-oxygenation with 100% oxygen Induction Type: IV induction Ventilation: Two handed mask ventilation required and Oral airway inserted - appropriate to patient size Laryngoscope Size: Mac and 4 Grade View: Grade III Tube type: Oral Tube size: 7.5 mm Number of attempts: 2 Airway Equipment and Method: Oral airway and Bougie stylet Placement Confirmation: ETT inserted through vocal cords under direct vision,  positive ETCO2 and breath sounds checked- equal and bilateral Secured at: 23 cm Tube secured with: Tape Dental Injury: Teeth and Oropharynx as per pre-operative assessment

## 2018-12-25 NOTE — Transfer of Care (Signed)
Immediate Anesthesia Transfer of Care Note  Patient: Frank Ware  Procedure(s) Performed: Procedure(s) (LRB): CYSTOSCOPY LEFT RETROGRADE PYELOGRAM LEFT URETEROSCOPY WITH HOLMIUM LASER LEFT STENT EXCHANGE (Left)  Patient Location: PACU  Anesthesia Type: General  Level of Consciousness: awake, sedated, patient cooperative and responds to stimulation  Airway & Oxygen Therapy: Patient Spontanous Breathing and Patient connected to [place] 02 and soft FM   Post-op Assessment: Report given to PACU RN, Post -op Vital signs reviewed and stable and Patient moving all extremities  Post vital signs: Reviewed and stable  Complications: No apparent anesthesia complications

## 2018-12-25 NOTE — Anesthesia Postprocedure Evaluation (Signed)
Anesthesia Post Note  Patient: Frank Ware  Procedure(s) Performed: CYSTOSCOPY LEFT RETROGRADE PYELOGRAM LEFT URETEROSCOPY WITH HOLMIUM LASER LEFT STENT EXCHANGE (Left Ureter)     Patient location during evaluation: PACU Anesthesia Type: General Level of consciousness: awake and alert Pain management: pain level controlled Vital Signs Assessment: post-procedure vital signs reviewed and stable Respiratory status: spontaneous breathing, nonlabored ventilation, respiratory function stable and patient connected to nasal cannula oxygen Cardiovascular status: blood pressure returned to baseline and stable Postop Assessment: no apparent nausea or vomiting Anesthetic complications: no    Last Vitals:  Vitals:   12/25/18 1000 12/25/18 1015  BP: 122/75 (!) 107/91  Pulse: 97 96  Resp: 19 16  Temp:    SpO2: 97% 97%    Last Pain:  Vitals:   12/25/18 1030  TempSrc:   PainSc: 5                  Montez Hageman

## 2018-12-27 DIAGNOSIS — S129XXS Fracture of neck, unspecified, sequela: Secondary | ICD-10-CM | POA: Diagnosis not present

## 2018-12-27 DIAGNOSIS — M961 Postlaminectomy syndrome, not elsewhere classified: Secondary | ICD-10-CM | POA: Diagnosis not present

## 2018-12-27 DIAGNOSIS — G894 Chronic pain syndrome: Secondary | ICD-10-CM | POA: Diagnosis not present

## 2018-12-27 DIAGNOSIS — R52 Pain, unspecified: Secondary | ICD-10-CM

## 2018-12-27 HISTORY — DX: Pain, unspecified: R52

## 2019-01-01 DIAGNOSIS — N200 Calculus of kidney: Secondary | ICD-10-CM | POA: Diagnosis not present

## 2019-01-01 DIAGNOSIS — R310 Gross hematuria: Secondary | ICD-10-CM | POA: Diagnosis not present

## 2019-01-15 DIAGNOSIS — E785 Hyperlipidemia, unspecified: Secondary | ICD-10-CM | POA: Diagnosis not present

## 2019-01-15 DIAGNOSIS — E1165 Type 2 diabetes mellitus with hyperglycemia: Secondary | ICD-10-CM | POA: Diagnosis not present

## 2019-01-15 DIAGNOSIS — Z79899 Other long term (current) drug therapy: Secondary | ICD-10-CM | POA: Diagnosis not present

## 2019-01-16 DIAGNOSIS — E782 Mixed hyperlipidemia: Secondary | ICD-10-CM | POA: Diagnosis not present

## 2019-01-16 DIAGNOSIS — R3121 Asymptomatic microscopic hematuria: Secondary | ICD-10-CM | POA: Diagnosis not present

## 2019-01-16 DIAGNOSIS — R110 Nausea: Secondary | ICD-10-CM | POA: Diagnosis not present

## 2019-01-16 DIAGNOSIS — N200 Calculus of kidney: Secondary | ICD-10-CM | POA: Diagnosis not present

## 2019-01-16 DIAGNOSIS — E1165 Type 2 diabetes mellitus with hyperglycemia: Secondary | ICD-10-CM | POA: Diagnosis not present

## 2019-01-16 DIAGNOSIS — I10 Essential (primary) hypertension: Secondary | ICD-10-CM | POA: Diagnosis not present

## 2019-02-12 DIAGNOSIS — K219 Gastro-esophageal reflux disease without esophagitis: Secondary | ICD-10-CM | POA: Diagnosis not present

## 2019-02-12 DIAGNOSIS — K589 Irritable bowel syndrome without diarrhea: Secondary | ICD-10-CM | POA: Diagnosis not present

## 2019-03-11 DIAGNOSIS — Z8719 Personal history of other diseases of the digestive system: Secondary | ICD-10-CM

## 2019-03-11 HISTORY — DX: Personal history of other diseases of the digestive system: Z87.19

## 2019-03-21 ENCOUNTER — Other Ambulatory Visit (INDEPENDENT_AMBULATORY_CARE_PROVIDER_SITE_OTHER): Payer: Medicare Other

## 2019-03-21 ENCOUNTER — Encounter: Payer: Self-pay | Admitting: Gastroenterology

## 2019-03-21 ENCOUNTER — Ambulatory Visit: Payer: Medicare Other | Admitting: Gastroenterology

## 2019-03-21 VITALS — BP 118/60 | HR 82 | Temp 97.2°F | Ht 66.0 in | Wt 222.0 lb

## 2019-03-21 DIAGNOSIS — R1084 Generalized abdominal pain: Secondary | ICD-10-CM | POA: Diagnosis not present

## 2019-03-21 DIAGNOSIS — R1033 Periumbilical pain: Secondary | ICD-10-CM | POA: Diagnosis not present

## 2019-03-21 DIAGNOSIS — K219 Gastro-esophageal reflux disease without esophagitis: Secondary | ICD-10-CM

## 2019-03-21 LAB — CBC WITH DIFFERENTIAL/PLATELET
Basophils Absolute: 0.1 10*3/uL (ref 0.0–0.1)
Basophils Relative: 1.1 % (ref 0.0–3.0)
Eosinophils Absolute: 0.1 10*3/uL (ref 0.0–0.7)
Eosinophils Relative: 2.1 % (ref 0.0–5.0)
HCT: 39.2 % (ref 39.0–52.0)
Hemoglobin: 13.2 g/dL (ref 13.0–17.0)
Lymphocytes Relative: 25.5 % (ref 12.0–46.0)
Lymphs Abs: 1.7 10*3/uL (ref 0.7–4.0)
MCHC: 33.6 g/dL (ref 30.0–36.0)
MCV: 89.2 fl (ref 78.0–100.0)
Monocytes Absolute: 0.5 10*3/uL (ref 0.1–1.0)
Monocytes Relative: 7.8 % (ref 3.0–12.0)
Neutro Abs: 4.1 10*3/uL (ref 1.4–7.7)
Neutrophils Relative %: 63.5 % (ref 43.0–77.0)
Platelets: 223 10*3/uL (ref 150.0–400.0)
RBC: 4.39 Mil/uL (ref 4.22–5.81)
RDW: 14.8 % (ref 11.5–15.5)
WBC: 6.5 10*3/uL (ref 4.0–10.5)

## 2019-03-21 LAB — COMPREHENSIVE METABOLIC PANEL
ALT: 11 U/L (ref 0–53)
AST: 13 U/L (ref 0–37)
Albumin: 4.7 g/dL (ref 3.5–5.2)
Alkaline Phosphatase: 63 U/L (ref 39–117)
BUN: 33 mg/dL — ABNORMAL HIGH (ref 6–23)
CO2: 28 mEq/L (ref 19–32)
Calcium: 10.1 mg/dL (ref 8.4–10.5)
Chloride: 101 mEq/L (ref 96–112)
Creatinine, Ser: 1.54 mg/dL — ABNORMAL HIGH (ref 0.40–1.50)
GFR: 45.33 mL/min — ABNORMAL LOW (ref >60.00)
Glucose, Bld: 79 mg/dL (ref 70–99)
Potassium: 4.4 mEq/L (ref 3.5–5.1)
Sodium: 136 mEq/L (ref 135–145)
Total Bilirubin: 0.4 mg/dL (ref 0.2–1.2)
Total Protein: 7.5 g/dL (ref 6.0–8.3)

## 2019-03-21 LAB — LIPASE: Lipase: 91 U/L — ABNORMAL HIGH (ref 11.0–59.0)

## 2019-03-21 MED ORDER — DICYCLOMINE HCL 10 MG PO CAPS: 10.0000 mg | ORAL_CAPSULE | Freq: Three times a day (TID) | ORAL | 0 refills | Status: DC | PRN

## 2019-03-21 NOTE — Progress Notes (Signed)
HPI :  69 year old male with a history of diabetes, hyperlipidemia, renal stones, referred by Dr. [name] for abdominal pain.  Over the last 3 weeks he has developed severe discomfort in his abdomen.  He states is located around his umbilicus, slightly above it and to the sides of it.  He feels that it occurs a few hours after he eats dinner and then he has a very difficult time sleeping due to the discomfort.  He rates it 6-7 out of 10.  It tends to abate on its own after a few hours and then he feels well again.  During this time he cannot sleep and is very uncomfortable.  This has been impacting his sleep significantly.  Interestingly he has no pains at all during the day and eats well at that time.  He has had some mild nausea over the past year for which he has been treated with Zofran as needed.  He does not have any vomiting.  When he has these pain episodes he denies any nausea or vomiting with this.  He has some occasional reflux about 3 days a week where he will take some Tums or Rolaids for mild symptoms.  He denies any severe reflux or regurgitation during the time of his abdominal pain.  He denies any dysphagia.  He had a history of renal stones managed by Dr. Jeffie Pollock in the past.  He states this symptom is very different from his renal stone symptoms.  He has not had any problems moving his bowels.  Rare constipation but mostly fairly regular.  No blood in his stools.  He denies any weight loss during this time.  Having a bowel movement does not make him feel any better.  He had a history of a cervical spine fusion and has chronic neck pain/arthritis.  He takes ibuprofen every day for this pain.  He denies any dark stools or melena.  His brother has had pancreatic cancer.  Patient states he has had a colonoscopy and an endoscopy in [place] anywhere from 5 to 7 years ago.  Reports having a polyp removed in the past, he is unaware of the details of this.  No records  available.   Past Medical History:  Diagnosis Date  . Diabetes mellitus without complication ([place])   . High cholesterol   . History of kidney stones   . Hypertension      Past Surgical History:  Procedure Laterality Date  . CERVICAL FUSION     C3-C4 1992 and C5-C6 2001.  . CYSTOSCOPY/URETEROSCOPY/HOLMIUM LASER/STENT PLACEMENT Left 11/22/2018   Procedure: CYSTOSCOPY LEFT URETEROSCOPY/HOLMIUM LASER/STENT EXCHANGE;  Surgeon: Irine Seal, MD;  [facility];  Service: Urology;  Laterality: Left;  . CYSTOSCOPY/URETEROSCOPY/HOLMIUM LASER/STENT PLACEMENT Left 12/25/2018   Procedure: CYSTOSCOPY LEFT RETROGRADE PYELOGRAM LEFT URETEROSCOPY WITH HOLMIUM LASER LEFT STENT EXCHANGE;  Surgeon: Irine Seal, MD;  Location[facility];  Service: Urology;  Laterality: Left;  . KNEE ARTHROPLASTY Right   . KNEE ARTHROSCOPY Bilateral 1985  . LITHOTRIPSY     Family History  Problem Relation Age of Onset  . Lymphoma Brother   . Pancreatic cancer Brother   . Diabetes Maternal Grandfather    Social History   Tobacco Use  . Smoking status: Never Smoker  . Smokeless tobacco: Never Used  Substance Use Topics  . Alcohol use: Not Currently  . Drug use: Never   Current Outpatient Medications  Medication Sig Dispense Refill  . amLODipine (NORVASC) 10  MG tablet Take 10 mg by mouth daily.     [name] atorvastatin (LIPITOR) 20 MG tablet Take 20 mg by mouth daily.     . Digestive Enzymes (DIGESTIVE ENZYME ULTRA PO) Take by mouth daily.    [name] glipiZIDE (GLUCOTROL XL) 10 MG 24 hr tablet Take 10 mg by mouth daily with breakfast.     . metFORMIN (GLUCOPHAGE) 1000 MG tablet Take 1,000 mg by mouth 2 (two) times daily with a meal.     . olmesartan (BENICAR) 40 MG tablet Take 40 mg by mouth daily.     . ondansetron (ZOFRAN-ODT) 8 MG disintegrating tablet Take 8 mg by mouth every 8 (eight) hours as needed for nausea or vomiting.     . sitaGLIPtin (JANUVIA) 50 MG tablet Take 50 mg by  mouth daily. 1/2 tablet daily.     . traMADol (ULTRAM) 50 MG tablet Take by mouth every 6 (six) hours as needed.      No current facility-administered medications for this visit.   No Known Allergies   Review of Systems: All systems reviewed and negative except where noted in HPI.   Lab Results  Component Value Date   HGB 12.9 (L) 12/25/2018   HCT 38.0 (L) 12/25/2018    Lab Results  Component Value Date   CREATININE 0.90 12/25/2018   BUN 24 (H) 12/25/2018   NA 138 12/25/2018   K 4.0 12/25/2018   CL 102 12/25/2018     Physical Exam: BP 118/60   Pulse 82   Temp (!) 97.2 F (36.2 C)   Ht 5\' 6"  (1.676 m)   Wt 222 lb (100.7 kg)   BMI 35.83 kg/m  Constitutional: Pleasant,well-developed, male in no acute distress. HEENT: Normocephalic and atraumatic. Conjunctivae are normal. No scleral icterus. Neck supple.  Cardiovascular: Normal rate, regular rhythm.  Pulmonary/chest: Effort normal and breath sounds normal. No wheezing, rales or rhonchi. Abdominal: Soft, protuberant with upper abdominal fullness, nontender.   Extremities: no edema Lymphadenopathy: No cervical adenopathy noted. Neurological: Alert and oriented to person place and time. Skin: Skin is warm and dry. No rashes noted. Psychiatric: Normal mood and affect. Behavior is normal.   ASSESSMENT AND PLAN: 69 year old male here for new patient assessment of the following:  Abdominal pain - history as outlined above, nocturnal mid abdominal pain around the umbilicus.  Discussed differential with him.  PUD and biliary colic seems less likely given the location.  He has some occasional nausea but no vomiting or obstructive symptoms.  He does take NSAIDs routinely and is at risk for small bowel ulceration.  I recommend he completely stop NSAIDs and use Tylenol as needed for his arthritic pain.  Ultimately given his persistent symptoms I am recommending a CT scan of the abdomen and pelvis with contrast to further evaluate  this.  We will plan on checking basic labs today as well to ensure stable.  On review of his chart he had a mild anemia noted in December, we will repeat his hemoglobin to reassess that and will check his liver enzymes and lipase as well.  In the interim I will give him an empiric trial of Bentyl to take nightly to see if that will help relieve his symptoms at all.  Further recommendations pending results of labs, scan, and his course.  If things worsen in the interim he should contact me for further assessment.  GERD - mild reflux symptoms for which she takes TUMS or Rolaids as needed and it works fairly  well.  This would seem to be unlikely related to his abdominal pain as he describes it today.  He has had a prior EGD and will get that report to make sure no evidence of Barrett's esophagus.  We will also get his last colonoscopy report in light of reported history of polyps.  He agreed with the plan.  [place] Cellar, MD [facility]  CC: [address], [name], [place]

## 2019-03-21 NOTE — Patient Instructions (Signed)
If you are age 67 or older, your body mass index should be between 23-30. Your Body mass index is 35.83 kg/m. If this is out of the aforementioned range listed, please consider follow up with your Primary Care Provider.  If you are age 55 or younger, your body mass index should be between 19-25. Your Body mass index is 35.83 kg/m. If this is out of the aformentioned range listed, please consider follow up with your Primary Care Provider.   You have been scheduled for a CT scan of the abdomen and pelvis at [facility], 1st floor Radiology.  You are scheduled on Thursday, 03-28-19 at 8:00am. You should arrive 15 minutes prior to your appointment time for registration. Please follow the written instructions below on the day of your exam:  WARNING: IF YOU ARE ALLERGIC TO IODINE/X-RAY DYE, PLEASE NOTIFY RADIOLOGY IMMEDIATELY AT [phone]! YOU WILL BE GIVEN A 13 HOUR PREMEDICATION PREP.  1) Do not eat anything after 4:00am (4 hours prior to your test) 2) You have been given 2 bottles of oral contrast to drink. The solution may taste better if refrigerated, but do NOT add ice or any other liquid to this solution. Shake well before drinking.    Drink 1 bottle of contrast @  6:00am (2 hours prior to your exam)  Drink 1 bottle of contrast @ 7:00am (1 hour prior to your exam)  You may take any medications as prescribed with a small amount of water, if necessary. If you take any of the following medications: METFORMIN, GLUCOPHAGE, GLUCOVANCE, AVANDAMET, RIOMET, FORTAMET, [place] MET, JANUMET, GLUMETZA or METAGLIP, you MAY be asked to HOLD this medication 48 hours AFTER the exam.  The purpose of you drinking the oral contrast is to aid in the visualization of your intestinal tract. The contrast solution may cause some diarrhea. Depending on your individual set of symptoms, you may also receive an intravenous injection of x-ray contrast/dye. Plan on being at [facility] for 30 minutes or longer,  depending on the type of exam you are having performed.  This test typically takes 30-45 minutes to complete.  If you have any questions regarding your exam or if you need to reschedule, you may call [phone] between the hours of 8:00 am and 5:00 pm, Monday-Friday.  ________________________________________________________________________  Please go to the lab in the basement of our building to have lab work done as you leave today. Hit "B" for basement when you get on the elevator.  When the doors open the lab is on your left.  We will call you with the results. Thank you.   Due to recent changes in healthcare laws, you may see the results of your imaging and laboratory studies on MyChart before your provider has had a chance to review them.  We understand that in some cases there may be results that are confusing or concerning to you. Not all laboratory results come back in the same time frame and the provider may be waiting for multiple results in order to interpret others.  Please give [place] 48 hours in order for your provider to thoroughly review all the results before contacting the office for clarification of your results.    We have sent the following medications to your pharmacy for you to pick up at your convenience: Bentyl 10 mg: Take 1 to 2 tablets every 8 hours as needed  Discontinue taking ibuprofen and all other NSAIDs.  Thank you for entrusting me with your care and for choosing  [employer], Dr. Stanfield Cellar

## 2019-03-22 DIAGNOSIS — K589 Irritable bowel syndrome without diarrhea: Secondary | ICD-10-CM | POA: Diagnosis not present

## 2019-03-22 DIAGNOSIS — K219 Gastro-esophageal reflux disease without esophagitis: Secondary | ICD-10-CM | POA: Diagnosis not present

## 2019-03-22 DIAGNOSIS — G4733 Obstructive sleep apnea (adult) (pediatric): Secondary | ICD-10-CM | POA: Diagnosis not present

## 2019-03-22 DIAGNOSIS — I10 Essential (primary) hypertension: Secondary | ICD-10-CM | POA: Diagnosis not present

## 2019-03-22 DIAGNOSIS — E1165 Type 2 diabetes mellitus with hyperglycemia: Secondary | ICD-10-CM | POA: Diagnosis not present

## 2019-03-26 ENCOUNTER — Other Ambulatory Visit (INDEPENDENT_AMBULATORY_CARE_PROVIDER_SITE_OTHER): Payer: Medicare Other

## 2019-03-26 ENCOUNTER — Other Ambulatory Visit: Payer: Self-pay

## 2019-03-26 ENCOUNTER — Telehealth: Payer: Self-pay | Admitting: Gastroenterology

## 2019-03-26 DIAGNOSIS — R748 Abnormal levels of other serum enzymes: Secondary | ICD-10-CM | POA: Diagnosis not present

## 2019-03-26 LAB — BASIC METABOLIC PANEL
BUN: 28 mg/dL — ABNORMAL HIGH (ref 6–23)
CO2: 22 mEq/L (ref 19–32)
Calcium: 9.5 mg/dL (ref 8.4–10.5)
Chloride: 103 mEq/L (ref 96–112)
Creatinine, Ser: 1.34 mg/dL (ref 0.40–1.50)
GFR: 53.23 mL/min — ABNORMAL LOW (ref >60.00)
Glucose, Bld: 110 mg/dL — ABNORMAL HIGH (ref 70–99)
Potassium: 4.3 mEq/L (ref 3.5–5.1)
Sodium: 135 mEq/L (ref 135–145)

## 2019-03-26 NOTE — Telephone Encounter (Signed)
Returned patient's call, he wanted to know what his creatine was, and I gave him that value

## 2019-03-27 DIAGNOSIS — M961 Postlaminectomy syndrome, not elsewhere classified: Secondary | ICD-10-CM | POA: Diagnosis not present

## 2019-03-27 DIAGNOSIS — M5481 Occipital neuralgia: Secondary | ICD-10-CM | POA: Diagnosis not present

## 2019-03-27 DIAGNOSIS — G894 Chronic pain syndrome: Secondary | ICD-10-CM | POA: Diagnosis not present

## 2019-03-27 DIAGNOSIS — Z79899 Other long term (current) drug therapy: Secondary | ICD-10-CM | POA: Diagnosis not present

## 2019-03-28 ENCOUNTER — Other Ambulatory Visit: Payer: Self-pay

## 2019-03-28 ENCOUNTER — Ambulatory Visit (HOSPITAL_COMMUNITY)
Admission: RE | Admit: 2019-03-28 | Discharge: 2019-03-28 | Disposition: A | Discharge status: Home / Self Care | Payer: Medicare Other | Source: Ambulatory Visit | Attending: Gastroenterology | Admitting: Gastroenterology

## 2019-03-28 ENCOUNTER — Encounter (HOSPITAL_COMMUNITY): Payer: Self-pay

## 2019-03-28 DIAGNOSIS — R1084 Generalized abdominal pain: Secondary | ICD-10-CM

## 2019-03-28 DIAGNOSIS — R109 Unspecified abdominal pain: Secondary | ICD-10-CM | POA: Diagnosis not present

## 2019-03-28 MED ORDER — SODIUM CHLORIDE (PF) 0.9 % IJ SOLN
INTRAMUSCULAR | Status: AC
  Filled 2019-03-28: qty 50

## 2019-03-28 MED ORDER — IOHEXOL 300 MG/ML  SOLN
75.0000 mL | Freq: Once | INTRAMUSCULAR | Status: AC | PRN
  Administered 2019-03-28: 75 mL via INTRAVENOUS

## 2019-03-28 MED ORDER — OMEPRAZOLE 40 MG PO CPDR: 40.0000 mg | DELAYED_RELEASE_CAPSULE | Freq: Two times a day (BID) | ORAL | 3 refills | Status: DC

## 2019-03-29 ENCOUNTER — Encounter: Payer: Self-pay | Admitting: Gastroenterology

## 2019-04-02 ENCOUNTER — Ambulatory Visit (INDEPENDENT_AMBULATORY_CARE_PROVIDER_SITE_OTHER): Payer: Medicare Other

## 2019-04-02 ENCOUNTER — Other Ambulatory Visit: Payer: Self-pay | Admitting: Gastroenterology

## 2019-04-02 DIAGNOSIS — Z1159 Encounter for screening for other viral diseases: Secondary | ICD-10-CM

## 2019-04-02 LAB — SARS CORONAVIRUS 2 (TAT 6-24 HRS): SARS Coronavirus 2: NEGATIVE

## 2019-04-04 ENCOUNTER — Other Ambulatory Visit: Payer: Self-pay

## 2019-04-04 ENCOUNTER — Ambulatory Visit (AMBULATORY_SURGERY_CENTER): Payer: Medicare Other | Admitting: Gastroenterology

## 2019-04-04 ENCOUNTER — Encounter: Payer: Self-pay | Admitting: Gastroenterology

## 2019-04-04 VITALS — BP 133/74 | HR 67 | Temp 96.9°F | Resp 12 | Ht 66.0 in | Wt 222.0 lb

## 2019-04-04 DIAGNOSIS — K449 Diaphragmatic hernia without obstruction or gangrene: Secondary | ICD-10-CM | POA: Diagnosis not present

## 2019-04-04 DIAGNOSIS — K3189 Other diseases of stomach and duodenum: Secondary | ICD-10-CM

## 2019-04-04 DIAGNOSIS — R935 Abnormal findings on diagnostic imaging of other abdominal regions, including retroperitoneum: Secondary | ICD-10-CM

## 2019-04-04 DIAGNOSIS — K269 Duodenal ulcer, unspecified as acute or chronic, without hemorrhage or perforation: Secondary | ICD-10-CM

## 2019-04-04 DIAGNOSIS — K2980 Duodenitis without bleeding: Secondary | ICD-10-CM | POA: Diagnosis not present

## 2019-04-04 HISTORY — PX: ESOPHAGOGASTRODUODENOSCOPY (EGD) WITH PROPOFOL: SHX5813

## 2019-04-04 MED ORDER — SODIUM CHLORIDE 0.9 % IV SOLN: 500.0000 mL | Freq: Once | INTRAVENOUS | Status: DC

## 2019-04-04 NOTE — Op Note (Signed)
[facility] Patient Name: Frank Ware Procedure Date: 04/04/2019 11:00 AM MRN: [id number] Endoscopist: Remo Lipps P. Havery Moros , MD Age: 69 Referring MD:  Date of Birth: Jun 01, 1950 Gender: Male Account #: [id number] Procedure:                Upper GI endoscopy Indications:              Abdominal pain, Abnormal CT of the GI tract                            suggesting duodenal ulcer, now on omeprazole 40mg                             twice daily with significant improvement of symptoms Medicines:                Monitored Anesthesia Care Procedure:                Pre-Anesthesia Assessment:                           - Prior to the procedure, a History and Physical                            was performed, and patient medications and                            allergies were reviewed. [name] tolerance of                            previous anesthesia was also reviewed. The risks                            and benefits of the procedure and the sedation                            options and risks were discussed with the patient.                            All questions were answered, and informed consent                            was obtained. Prior Anticoagulants: The patient has                            taken no previous anticoagulant or antiplatelet                            agents. ASA Grade Assessment: II - A patient with                            mild systemic disease. After reviewing the risks                            and benefits, the patient was deemed in  satisfactory condition to undergo the procedure.                           After obtaining informed consent, the endoscope was                            passed under direct vision. Throughout the                            procedure, the patient's blood pressure, pulse, and                            oxygen saturations were monitored continuously. The   Endoscope was introduced through the mouth, and                            advanced to the second part of duodenum. The upper                            GI endoscopy was accomplished without difficulty.                            The patient tolerated the procedure well. Scope In: Scope Out: Findings:                 Esophagogastric landmarks were identified: the                            Z-line was found at 36 cm, the gastroesophageal                            junction was found at 36 cm and the upper extent of                            the gastric folds was found at 38 cm from the                            incisors.                           A 2 cm hiatal hernia was present.                           The exam of the esophagus was otherwise normal.                           The entire examined stomach was normal. Biopsies                            were taken with a cold forceps for Helicobacter                            pylori testing.  One non-bleeding cratered duodenal ulcer with no                            stigmata of bleeding was found in the duodenal                            bulb. The lesion was roughly 5-6 mm in largest                            dimension, with heaped up margins, suspect                            inflammatory tissue surrouding it but the periphery                            was biopsied were taken with a cold forceps for                            histology.                           The exam of the duodenum was otherwise normal. Complications:            No immediate complications. Estimated blood loss:                            Minimal. Estimated Blood Loss:     Estimated blood loss was minimal. Impression:               - Esophagogastric landmarks identified.                           - 2 cm hiatal hernia.                           - Normal esophagus otherwise.                           - Normal stomach. Biopsied to rule  out H pylori.                           - Non-bleeding duodenal ulcer with no stigmata of                            bleeding as outlined above, peripheral tissue                            biopsied. Recommendation:           - Patient has a contact number available for                            emergencies. The signs and symptoms of potential                            delayed complications were discussed with the  patient. Return to normal activities tomorrow.                            Written discharge instructions were provided to the                            patient.                           - Resume previous diet.                           - Continue present medications - omeprazole 40mg                             twice daily for total of 1 month then once daily                            for another month                           - Await pathology results.                           - No ibuprofen, naproxen, or other non-steroidal                            anti-inflammatory drugs Juletta Berhe P. Mailyn Steichen, MD 04/04/2019 11:39:12 AM This report has been signed electronically.

## 2019-04-04 NOTE — Patient Instructions (Signed)
Thank you for letting us take care of your healthcare needs today. No NSAIDS, ibuprofen, naproxen etc. Continue to take your Omeprazole 40 mg twice a day for a month then reduce to once a day for a month.   YOU HAD AN ENDOSCOPIC PROCEDURE TODAY AT [facility]:   Refer to the procedure report that was given to you for any specific questions about what was found during the examination.  If the procedure report does not answer your questions, please call your gastroenterologist to clarify.  If you requested that your care partner not be given the details of your procedure findings, then the procedure report has been included in a sealed envelope for you to review at your convenience later.  YOU SHOULD EXPECT: Some feelings of bloating in the abdomen. Passage of more gas than usual.  Walking can help get rid of the air that was put into your GI tract during the procedure and reduce the bloating. If you had a lower endoscopy (such as a colonoscopy or flexible sigmoidoscopy) you may notice spotting of blood in your stool or on the toilet paper. If you underwent a bowel prep for your procedure, you may not have a normal bowel movement for a few days.  Please Note:  You might notice some irritation and congestion in your nose or some drainage.  This is from the oxygen used during your procedure.  There is no need for concern and it should clear up in a day or so.  SYMPTOMS TO REPORT IMMEDIATELY:      Following upper endoscopy (EGD)  Vomiting of blood or coffee ground material  New chest pain or pain under the shoulder blades  Painful or persistently difficult swallowing  New shortness of breath  Fever of 100F or higher  Black, tarry-looking stools  For urgent or emergent issues, a gastroenterologist can be reached at any hour by calling [phone]. Do not use MyChart messaging for urgent concerns.    DIET:  We do recommend a small meal at first, but then you may proceed to  your regular diet.  Drink plenty of fluids but you should avoid alcoholic beverages for 24 hours.  ACTIVITY:  You should plan to take it easy for the rest of today and you should NOT DRIVE or use heavy machinery until tomorrow (because of the sedation medicines used during the test).    FOLLOW UP: Our staff will call the number listed on your records 48-72 hours following your procedure to check on you and address any questions or concerns that you may have regarding the information given to you following your procedure. If we do not reach you, we will leave a message.  We will attempt to reach you two times.  During this call, we will ask if you have developed any symptoms of COVID 19. If you develop any symptoms (ie: fever, flu-like symptoms, shortness of breath, cough etc.) before then, please call [phone].  If you test positive for Covid 19 in the 2 weeks post procedure, please call and report this information to [place].    If any biopsies were taken you will be contacted by phone or by letter within the next 1-3 weeks.  Please call us at [phone] if you have not heard about the biopsies in 3 weeks.    SIGNATURES/CONFIDENTIALITY: You and/or your care partner have signed paperwork which will be entered into your electronic medical record.  These signatures attest to the fact that that  the information above on your After Visit Summary has been reviewed and is understood.  Full responsibility of the confidentiality of this discharge information lies with you and/or your care-partner.

## 2019-04-04 NOTE — Progress Notes (Signed)
Called to room to assist during endoscopic procedure.  Patient ID and intended procedure confirmed with present staff. Received instructions for my participation in the procedure from the performing physician.  

## 2019-04-04 NOTE — Progress Notes (Signed)
Report given to PACU, vss 

## 2019-04-04 NOTE — Progress Notes (Signed)
Temp-lc  V/s-cw 

## 2019-04-08 ENCOUNTER — Telehealth: Payer: Self-pay

## 2019-04-08 NOTE — Telephone Encounter (Signed)
  Follow up Call-  Call back number 04/04/2019  Post procedure Call Back phone  # [phone]  Permission to leave phone message Yes  Some recent data might be hidden     Patient questions:  Do you have a fever, pain , or abdominal swelling? No. Pain Score  0 *  Have you tolerated food without any problems? Yes.    Have you been able to return to your normal activities? Yes.    Do you have any questions about your discharge instructions: Diet   No. Medications  No. Follow up visit  No.  Do you have questions or concerns about your Care? No.  Actions: * If pain score is 4 or above: No action needed, pain <4.  1. Have you developed a fever since your procedure? no  2.   Have you had an respiratory symptoms (SOB or cough) since your procedure? no  3.   Have you tested positive for COVID 19 since your procedure no  4.   Have you had any family members/close contacts diagnosed with the COVID 19 since your procedure?  no   If yes to any of these questions please route to Joylene John, RN and Erenest Rasher, RN

## 2019-04-09 ENCOUNTER — Encounter: Payer: Self-pay | Admitting: Gastroenterology

## 2019-04-24 DIAGNOSIS — R2231 Localized swelling, mass and lump, right upper limb: Secondary | ICD-10-CM | POA: Diagnosis not present

## 2019-04-25 DIAGNOSIS — R2231 Localized swelling, mass and lump, right upper limb: Secondary | ICD-10-CM | POA: Diagnosis not present

## 2019-04-25 DIAGNOSIS — K449 Diaphragmatic hernia without obstruction or gangrene: Secondary | ICD-10-CM | POA: Diagnosis not present

## 2019-04-25 DIAGNOSIS — I10 Essential (primary) hypertension: Secondary | ICD-10-CM | POA: Diagnosis not present

## 2019-04-25 DIAGNOSIS — E1165 Type 2 diabetes mellitus with hyperglycemia: Secondary | ICD-10-CM | POA: Diagnosis not present

## 2019-04-25 DIAGNOSIS — K219 Gastro-esophageal reflux disease without esophagitis: Secondary | ICD-10-CM | POA: Diagnosis not present

## 2019-05-28 DIAGNOSIS — Z79899 Other long term (current) drug therapy: Secondary | ICD-10-CM | POA: Diagnosis not present

## 2019-05-28 DIAGNOSIS — E782 Mixed hyperlipidemia: Secondary | ICD-10-CM | POA: Diagnosis not present

## 2019-05-28 DIAGNOSIS — E1165 Type 2 diabetes mellitus with hyperglycemia: Secondary | ICD-10-CM | POA: Diagnosis not present

## 2019-05-30 DIAGNOSIS — K219 Gastro-esophageal reflux disease without esophagitis: Secondary | ICD-10-CM | POA: Diagnosis not present

## 2019-05-30 DIAGNOSIS — E782 Mixed hyperlipidemia: Secondary | ICD-10-CM | POA: Diagnosis not present

## 2019-05-30 DIAGNOSIS — K449 Diaphragmatic hernia without obstruction or gangrene: Secondary | ICD-10-CM | POA: Diagnosis not present

## 2019-05-30 DIAGNOSIS — I10 Essential (primary) hypertension: Secondary | ICD-10-CM | POA: Diagnosis not present

## 2019-05-30 DIAGNOSIS — E1165 Type 2 diabetes mellitus with hyperglycemia: Secondary | ICD-10-CM | POA: Diagnosis not present

## 2019-06-11 DIAGNOSIS — Z8616 Personal history of COVID-19: Secondary | ICD-10-CM

## 2019-06-11 HISTORY — DX: Personal history of COVID-19: Z86.16

## 2019-06-25 DIAGNOSIS — S129XXS Fracture of neck, unspecified, sequela: Secondary | ICD-10-CM | POA: Diagnosis not present

## 2019-06-25 DIAGNOSIS — G894 Chronic pain syndrome: Secondary | ICD-10-CM | POA: Diagnosis not present

## 2019-06-25 DIAGNOSIS — M961 Postlaminectomy syndrome, not elsewhere classified: Secondary | ICD-10-CM | POA: Diagnosis not present

## 2019-06-29 ENCOUNTER — Other Ambulatory Visit: Payer: Self-pay | Admitting: Gastroenterology

## 2019-07-01 NOTE — Telephone Encounter (Signed)
Your EGD note (duodenal ulcer) from March 2021 said for pt to take omeprazole 40 mg  bid for 1 month and then once daily for another month. Does that mean you wanted him to stop after that? or should he continue once daily? Thank you

## 2019-07-01 NOTE — Telephone Encounter (Signed)
Thanks Jan. Recs were in path letter. He can stop after 2 months of therapy, I don't think he needs it anymore unless using NSAIDs routinely again and hopefully he is not doing that. He can see me in clinic for routine follow up otherwise to discuss it further. Thanks

## 2019-07-05 ENCOUNTER — Telehealth: Payer: Self-pay | Admitting: Adult Health

## 2019-07-05 DIAGNOSIS — R5383 Other fatigue: Secondary | ICD-10-CM | POA: Diagnosis not present

## 2019-07-05 DIAGNOSIS — R509 Fever, unspecified: Secondary | ICD-10-CM | POA: Diagnosis not present

## 2019-07-05 NOTE — Telephone Encounter (Signed)
Frank Ware and discussed with him his COVID positivity and eligibility to receive treatment with monoclonal antibody infusion to reduce his risk for hospitalization and/or complications from the therapy.  He is at high risk due to his age, BMI, HTN, and diabetes.  After a thorough discussion, we would like to think about this, and will call me tomorrow to let me know what he decides.  I recommended that he call before 3pm.    Wilber Bihari, NP

## 2019-07-08 ENCOUNTER — Emergency Department (HOSPITAL_COMMUNITY)
Admission: EM | Admit: 2019-07-08 | Discharge: 2019-07-08 | Disposition: A | Discharge status: Home / Self Care | Payer: Medicare Other | Attending: Emergency Medicine | Admitting: Emergency Medicine

## 2019-07-08 ENCOUNTER — Encounter (HOSPITAL_COMMUNITY): Payer: Self-pay | Admitting: Emergency Medicine

## 2019-07-08 ENCOUNTER — Emergency Department (HOSPITAL_COMMUNITY): Payer: Medicare Other

## 2019-07-08 ENCOUNTER — Other Ambulatory Visit: Payer: Self-pay

## 2019-07-08 DIAGNOSIS — R0602 Shortness of breath: Secondary | ICD-10-CM | POA: Diagnosis not present

## 2019-07-08 DIAGNOSIS — Z7984 Long term (current) use of oral hypoglycemic drugs: Secondary | ICD-10-CM | POA: Insufficient documentation

## 2019-07-08 DIAGNOSIS — R05 Cough: Secondary | ICD-10-CM | POA: Diagnosis not present

## 2019-07-08 DIAGNOSIS — Z79899 Other long term (current) drug therapy: Secondary | ICD-10-CM | POA: Insufficient documentation

## 2019-07-08 DIAGNOSIS — U071 COVID-19: Secondary | ICD-10-CM | POA: Diagnosis not present

## 2019-07-08 DIAGNOSIS — I10 Essential (primary) hypertension: Secondary | ICD-10-CM | POA: Insufficient documentation

## 2019-07-08 DIAGNOSIS — D696 Thrombocytopenia, unspecified: Secondary | ICD-10-CM | POA: Insufficient documentation

## 2019-07-08 DIAGNOSIS — E119 Type 2 diabetes mellitus without complications: Secondary | ICD-10-CM | POA: Insufficient documentation

## 2019-07-08 LAB — COMPREHENSIVE METABOLIC PANEL
ALT: 21 U/L (ref 0–44)
AST: 27 U/L (ref 15–41)
Albumin: 3.9 g/dL (ref 3.5–5.0)
Alkaline Phosphatase: 61 U/L (ref 38–126)
Anion gap: 13 (ref 5–15)
BUN: 24 mg/dL — ABNORMAL HIGH (ref 8–23)
CO2: 23 mmol/L (ref 22–32)
Calcium: 9.7 mg/dL (ref 8.9–10.3)
Chloride: 98 mmol/L (ref 98–111)
Creatinine, Ser: 1.18 mg/dL (ref 0.61–1.24)
GFR calc Af Amer: >60 mL/min (ref >60)
GFR calc non Af Amer: >60 mL/min (ref >60)
Glucose, Bld: 211 mg/dL — ABNORMAL HIGH (ref 70–99)
Potassium: 4.7 mmol/L (ref 3.5–5.1)
Sodium: 134 mmol/L — ABNORMAL LOW (ref 135–145)
Total Bilirubin: 0.8 mg/dL (ref 0.3–1.2)
Total Protein: 6.9 g/dL (ref 6.5–8.1)

## 2019-07-08 LAB — CBC WITH DIFFERENTIAL/PLATELET
Abs Immature Granulocytes: 0.04 10*3/uL (ref 0.00–0.07)
Basophils Absolute: 0 10*3/uL (ref 0.0–0.1)
Basophils Relative: 0 %
Eosinophils Absolute: 0 10*3/uL (ref 0.0–0.5)
Eosinophils Relative: 0 %
HCT: 43 % (ref 39.0–52.0)
Hemoglobin: 14.1 g/dL (ref 13.0–17.0)
Immature Granulocytes: 1 %
Lymphocytes Relative: 7 %
Lymphs Abs: 0.4 10*3/uL — ABNORMAL LOW (ref 0.7–4.0)
MCH: 30.1 pg (ref 26.0–34.0)
MCHC: 32.8 g/dL (ref 30.0–36.0)
MCV: 91.9 fL (ref 80.0–100.0)
Monocytes Absolute: 0.3 10*3/uL (ref 0.1–1.0)
Monocytes Relative: 6 %
Neutro Abs: 4.6 10*3/uL (ref 1.7–7.7)
Neutrophils Relative %: 86 %
Platelets: 146 10*3/uL — ABNORMAL LOW (ref 150–400)
RBC: 4.68 MIL/uL (ref 4.22–5.81)
RDW: 14.6 % (ref 11.5–15.5)
WBC: 5.4 10*3/uL (ref 4.0–10.5)
nRBC: 0 % (ref 0.0–0.2)

## 2019-07-08 LAB — D-DIMER, QUANTITATIVE: D-Dimer, Quant: 0.45 ug/mL-FEU (ref 0.00–0.50)

## 2019-07-08 MED ORDER — DEXAMETHASONE SODIUM PHOSPHATE 10 MG/ML IJ SOLN
6.0000 mg | Freq: Once | INTRAMUSCULAR | Status: AC
  Administered 2019-07-08: 6 mg via INTRAMUSCULAR
  Filled 2019-07-08: qty 1

## 2019-07-08 MED ORDER — ALBUTEROL SULFATE HFA 108 (90 BASE) MCG/ACT IN AERS
2.0000 | INHALATION_SPRAY | Freq: Once | RESPIRATORY_TRACT | Status: AC
  Administered 2019-07-08: 2 via RESPIRATORY_TRACT
  Filled 2019-07-08: qty 6.7

## 2019-07-08 MED ORDER — BENZONATATE 100 MG PO CAPS
100.0000 mg | ORAL_CAPSULE | Freq: Three times a day (TID) | ORAL | 0 refills | Status: DC
Start: 2019-07-08 — End: 2019-11-07

## 2019-07-08 MED ORDER — SODIUM CHLORIDE 0.9% FLUSH: 3.0000 mL | Freq: Once | INTRAVENOUS | Status: DC

## 2019-07-08 MED ORDER — ALBUTEROL SULFATE HFA 108 (90 BASE) MCG/ACT IN AERS: 1.0000 | INHALATION_SPRAY | Freq: Four times a day (QID) | RESPIRATORY_TRACT | 0 refills | Status: DC | PRN

## 2019-07-08 MED ORDER — DEXAMETHASONE SODIUM PHOSPHATE 10 MG/ML IJ SOLN
6.0000 mg | Freq: Once | INTRAMUSCULAR | Status: DC
  Filled 2019-07-08: qty 1

## 2019-07-08 NOTE — ED Provider Notes (Signed)
[facility] EMERGENCY DEPARTMENT Provider Note   CSN: [id number] Arrival date & time: 07/08/19  1201     History Chief Complaint  Patient presents with  . covid positive    Frank Ware is a 69 y.o. male.  HPI      Frank Ware is a 69 y.o. male, with a history of DM, hypercholesterolemia, HTN, presenting to the ED with concerns surrounding COVID-19 infection. Patient states he began to have fever and fatigue Tuesday, June 22.  He tested positive for Covid on Friday, June 25.  States he began to have more coughing yesterday.  Accompanied by shortness of breath, especially with exertion. This morning, he coughed up some sputum containing streaks of blood.  He has not had any such instances this afternoon. Intermittent nausea with one episode of vomiting today.  States he has not had fever for the last 2 to 3 days.  He did have momentary drops in his SPO2 at home down to 87%, however, these were not sustained. He contacted his PCP today and was told to come to the ED. His PCP did prescribe him some Zofran for his nausea. Denies history of DVT/PE, recent surgery, recent trauma, recent immobilization. Denies dizziness, syncope, chest pain, shortness of breath at rest, abdominal pain, hematochezia/melena, diarrhea, or any other complaints.  Past Medical History:  Diagnosis Date  . Diabetes mellitus without complication ([place])   . High cholesterol   . History of kidney stones   . Hypertension     There are no problems to display for this patient.   Past Surgical History:  Procedure Laterality Date  . CERVICAL FUSION     C3-C4 1992 and C5-C6 2001.  [name] CERVICAL FUSION     x2  . CYSTOSCOPY/URETEROSCOPY/HOLMIUM LASER/STENT PLACEMENT Left 11/22/2018   Procedure: CYSTOSCOPY LEFT URETEROSCOPY/HOLMIUM LASER/STENT EXCHANGE;  Surgeon: Irine Seal, MD;  Location: [facility];  Service: Urology;  Laterality: Left;  . CYSTOSCOPY/URETEROSCOPY/HOLMIUM  LASER/STENT PLACEMENT Left 12/25/2018   Procedure: CYSTOSCOPY LEFT RETROGRADE PYELOGRAM LEFT URETEROSCOPY WITH HOLMIUM LASER LEFT STENT EXCHANGE;  Surgeon: Irine Seal, MD;  Location: [facility];  Service: Urology;  Laterality: Left;  . KNEE ARTHROPLASTY Right   . KNEE ARTHROSCOPY Bilateral 1985  . LITHOTRIPSY    . TONSILLECTOMY         Family History  Problem Relation Age of Onset  . Lymphoma Brother   . Pancreatic cancer Brother   . Diabetes Maternal Grandfather   . Colon cancer Neg Hx   . Colon polyps Neg Hx   . Esophageal cancer Neg Hx   . Rectal cancer Neg Hx   . Stomach cancer Neg Hx     Social History   Tobacco Use  . Smoking status: Never Smoker  . Smokeless tobacco: Never Used  Vaping Use  . Vaping Use: Never used  Substance Use Topics  . Alcohol use: Not Currently  . Drug use: Never    Home Medications Prior to Admission medications   Medication Sig Start Date End Date Taking? Authorizing Provider  albuterol (VENTOLIN HFA) 108 (90 Base) MCG/ACT inhaler Inhale 1-2 puffs into the lungs every 6 (six) hours as needed for wheezing or shortness of breath. 07/08/19   Yaniris Braddock C, PA-C  amLODipine (NORVASC) 10 MG tablet Take 10 mg by mouth daily.     [provider]  atorvastatin (LIPITOR) 20 MG tablet Take 20 mg by mouth daily.     [provider]  benzonatate (  TESSALON) 100 MG capsule Take 1 capsule (100 mg total) by mouth every 8 (eight) hours. 07/08/19   Joy, Shawn C, PA-C  Blood Glucose Monitoring Suppl (ONE TOUCH ULTRA 2) w/Device KIT USE AS DIRECTED ONCE DAILY 08/28/17   [provider]  buprenorphine (SUBUTEX) 2 MG SUBL SL tablet Place under the tongue. 03/31/19   [provider]  dicyclomine (BENTYL) 10 MG capsule Take 1-2 capsules (10-20 mg total) by mouth every 8 (eight) hours as needed for spasms. Take one 03/21/19   Armbruster, Steven P, MD  Digestive Enzymes (DIGESTIVE ENZYME ULTRA PO) Take by mouth daily.     [provider]  glipiZIDE (GLUCOTROL XL) 10 MG 24 hr tablet Take 10 mg by mouth daily with breakfast.     [provider]  glucose blood (ONETOUCH ULTRA) test strip USE 1 STRIP DAILY AS DIRECTED 08/28/17   [provider]  meclizine (ANTIVERT) 25 MG tablet TAKE 1 (ONE) TABLET EVERY 6 HOURS AS NEEDED DIZZINESS 11/06/17   [provider]  metFORMIN (GLUCOPHAGE) 1000 MG tablet Take 1,000 mg by mouth 2 (two) times daily with a meal.     [provider]  mupirocin ointment (BACTROBAN) 2 % APPLY TO AFFECTED AREA TWICE A DAY AS NEEDED FOR RASH/ULCER 09/26/16   [provider]  olmesartan (BENICAR) 40 MG tablet Take 40 mg by mouth daily.     [provider]  omeprazole (PRILOSEC) 40 MG capsule Take 1 capsule (40 mg total) by mouth in the morning and at bedtime. 03/28/19   Armbruster, Steven P, MD  ondansetron (ZOFRAN-ODT) 8 MG disintegrating tablet Take 8 mg by mouth every 8 (eight) hours as needed for nausea or vomiting.     [provider]  OneTouch Delica Lancets 33G MISC USE 1 LANCET DAILY 09/01/17   [provider]  pioglitazone (ACTOS) 45 MG tablet Take by mouth. 09/26/16   [provider]  sitaGLIPtin (JANUVIA) 50 MG tablet Take 50 mg by mouth daily. 1/2 tablet daily.     [provider]  traMADol (ULTRAM) 50 MG tablet Take by mouth every 6 (six) hours as needed.     [provider]    Allergies    Patient has no known allergies.  Review of Systems   Review of Systems  Constitutional: Positive for fatigue and fever.  Respiratory: Positive for cough and shortness of breath.   Cardiovascular: Negative for chest pain and leg swelling.  Gastrointestinal: Positive for nausea and vomiting. Negative for abdominal pain, blood in stool and diarrhea.  Genitourinary: Negative for difficulty urinating and dysuria.  Neurological: Negative for dizziness, syncope, weakness, numbness and headaches.  All  other systems reviewed and are negative.   Physical Exam Updated Vital Signs BP 125/89 (BP Location: Left Arm)   Pulse 95   Temp 98.8 F (37.1 C) (Oral)   Resp 18   SpO2 97%   Physical Exam Vitals and nursing note reviewed.  Constitutional:      General: He is not in acute distress.    Appearance: He is well-developed. He is not diaphoretic.  HENT:     Head: Normocephalic and atraumatic.     Mouth/Throat:     Mouth: Mucous membranes are moist.     Pharynx: Oropharynx is clear.  Eyes:     Conjunctiva/sclera: Conjunctivae normal.  Cardiovascular:     Rate and Rhythm: Normal rate and regular rhythm.     Pulses: Normal pulses.            Radial pulses are 2+ on the right side and 2+ on the left side.       Posterior tibial pulses are 2+ on the right side and 2+ on the left side.     Heart sounds: Normal heart sounds.     Comments: Tactile temperature in the extremities appropriate and equal bilaterally. Pulmonary:     Effort: Pulmonary effort is normal. No respiratory distress.     Breath sounds: Normal breath sounds.     Comments: No increased work of breathing.  Speaks in full sentences without difficulty. Abdominal:     Palpations: Abdomen is soft.     Tenderness: There is no abdominal tenderness. There is no guarding.  Musculoskeletal:     Cervical back: Neck supple.     Right lower leg: No edema.     Left lower leg: No edema.  Lymphadenopathy:     Cervical: No cervical adenopathy.  Skin:    General: Skin is warm and dry.  Neurological:     Mental Status: He is alert.  Psychiatric:        Mood and Affect: Mood and affect normal.        Speech: Speech normal.        Behavior: Behavior normal.     ED Results / Procedures / Treatments   Labs (all labs ordered are listed, but only abnormal results are displayed) Labs Reviewed  COMPREHENSIVE METABOLIC PANEL - Abnormal; Notable for the following components:      Result Value   Sodium 134 (*)    Glucose, Bld 211  (*)    BUN 24 (*)    All other components within normal limits  CBC WITH DIFFERENTIAL/PLATELET - Abnormal; Notable for the following components:   Platelets 146 (*)    Lymphs Abs 0.4 (*)    All other components within normal limits  D-DIMER, QUANTITATIVE (NOT AT [facility])    EKG EKG Interpretation  Date/Time:  Monday July 08 2019 17:05:58 EDT Ventricular Rate:  87 PR Interval:    QRS Duration: 94 QT Interval:  365 QTC Calculation: 440 R Axis:   83 Text Interpretation: Sinus rhythm Borderline right axis deviation Low voltage, precordial leads Confirmed by Madalyn Rob [phone]) on 07/08/2019 7:09:26 PM   Radiology DG Chest Portable 1 View  Result Date: 07/08/2019 CLINICAL DATA:  COVID positive, cough EXAM: PORTABLE CHEST 1 VIEW COMPARISON:  None. FINDINGS: Heart is normal size. Vague peripheral opacities in the mid to lower lungs bilaterally, left greater than right. No effusions. No acute bony abnormality. IMPRESSION: Vague peripheral opacities bilaterally, left slightly greater than right. Electronically Signed   By: Rolm Baptise M.D.   On: 07/08/2019 16:05    Procedures Procedures (including critical care time)  Medications Ordered in ED Medications  sodium chloride flush (NS) 0.9 % injection 3 mL (3 mLs Intravenous Not Given 07/08/19 1915)  albuterol (VENTOLIN HFA) 108 (90 Base) MCG/ACT inhaler 2 puff (2 puffs Inhalation Given 07/08/19 1842)  dexamethasone (DECADRON) injection 6 mg (6 mg Intramuscular Given 07/08/19 1931)    ED Course  I have reviewed the triage vital signs and the nursing notes.  Pertinent labs & imaging results that were available during my care of the patient were reviewed by me and considered in my medical decision making (see chart for details).  Clinical Course as of Jul 07 2004  Mon Jul 08, 2019  1910 RN notified me that patient's IV infiltrated while the Decadron was being pushed. She states this  occurred around 1840. They applied warm compresses.      [SJ]    Clinical Course User Index [SJ] Joy, Shawn C, PA-C   MDM Rules/Calculators/A&P                          Patient presents with symptoms consistent with COVID-19 infection. Patient is nontoxic appearing, afebrile, not tachycardic upon my evaluation, not tachypneic, not hypotensive, maintains excellent SPO2 on room air, and is in no apparent distress.   I have reviewed the patient's chart to obtain more information.   I reviewed and interpreted the patient's labs and radiological studies. D-dimer negative. The patient was given instructions for home care as well as return precautions. Patient voices understanding of these instructions, accepts the plan, and is comfortable with discharge.   Findings and plan of care discussed with Richard Dykstra, MD. Dr. Dykstra personally evaluated and examined this patient.  Vitals:   07/08/19 1212 07/08/19 1528 07/08/19 1901 07/08/19 1932  BP: 134/80 125/89 132/77 128/78  Pulse: (!) 112 95 95 97  Resp: 18 18 17 (!) 22  Temp: 97.8 F (36.6 C) 98.8 F (37.1 C)  98.6 F (37 C)  TempSrc:  Oral  Oral  SpO2: 96% 97% 95% 98%    Final Clinical Impression(s) / ED Diagnoses Final diagnoses:  COVID-19  Thrombocytopenia (HCC)    Rx / DC Orders ED Discharge Orders         Ordered    benzonatate (TESSALON) 100 MG capsule  Every 8 hours     Discontinue  Reprint     07/08/19 2000    albuterol (VENTOLIN HFA) 108 (90 Base) MCG/ACT inhaler  Every 6 hours PRN     Discontinue  Reprint     07/08/19 2000           Joy, Shawn C, PA-C 07/08/19 2007    Dykstra, Richard S, MD 07/08/19 2130  

## 2019-07-08 NOTE — Discharge Instructions (Addendum)
General Viral Syndrome Care Instructions:  Your symptoms are consistent with atypical COVID-19 infection, which is a viral illness. Viruses do not require or respond to antibiotics. Treatment is symptomatic care and it is important to note that these symptoms may last for 7-14 days.   Hand washing: Wash your hands throughout the day, but especially before and after touching the face, using the restroom, sneezing, coughing, or touching surfaces that have been coughed or sneezed upon. Hydration: Symptoms of most illnesses will be intensified and complicated by dehydration. Dehydration can also extend the duration of symptoms. Drink plenty of fluids and get plenty of rest. You should be drinking at least half a liter of water an hour to stay hydrated. Electrolyte drinks (ex. Gatorade, Powerade, Pedialyte) are also encouraged. You should be drinking enough fluids to make your urine light yellow, almost clear. If this is not the case, you are not drinking enough water. Please note that some of the treatments indicated below will not be effective if you are not adequately hydrated. Diet: Please concentrate on hydration, however, you may introduce food slowly.  Start with a clear liquid diet, progressed to a full liquid diet, and then bland solids as you are able. Pain or fever: Ibuprofen, Naproxen, or acetaminophen (generic for Tylenol) for pain or fever.  Antiinflammatory medications: Take 600 mg of ibuprofen every 6 hours or 440 mg (over the counter dose) to 500 mg (prescription dose) of naproxen every 12 hours for the next 3 days. After this time, these medications may be used as needed for pain. Take these medications with food to avoid upset stomach. Choose only one of these medications, do not take them together. Acetaminophen (generic for Tylenol): Should you continue to have additional pain while taking the ibuprofen or naproxen, you may add in acetaminophen as needed. Your daily total maximum amount of  acetaminophen from all sources should be limited to 4000mg /day for persons without liver problems, or 2000mg /day for those with liver problems. Nausea/vomiting: Use the ondansetron (generic for Zofran) for nausea or vomiting.  This medication may not prevent all vomiting or nausea, but can help facilitate better hydration. Things that can help with nausea/vomiting also include peppermint/menthol candies, vitamin B12, and ginger. Diarrhea: May use medications such as loperamide (Imodium) or Bismuth subsalicylate (Pepto-Bismol). Cough: Use the benzonatate (generic for Tessalon) for cough.  Teas, warm liquids, broths, and honey can also help with cough. Albuterol: May use the albuterol as needed for instances of shortness of breath. Zyrtec or Claritin: May add these medication daily to control underlying symptoms of congestion, sneezing, and other signs of allergies.  These medications are available over-the-counter. Generics: Cetirizine (generic for Zyrtec) and loratadine (generic for Claritin). Fluticasone: Use fluticasone (generic for Flonase), as directed, for nasal and sinus congestion.  This medication is available over-the-counter. Congestion: Plain guaifenesin (generic for plain Mucinex) may help relieve congestion. Saline sinus rinses and saline nasal sprays may also help relieve congestion.  Sore throat: Warm liquids or Chloraseptic spray may help soothe a sore throat. Gargle twice a day with a salt water solution made from a half teaspoon of salt in a cup of warm water.  Follow up: Follow up with a primary care provider within the next two weeks should symptoms fail to resolve. Return: Return to the ED for significantly worsening symptoms, shortness of breath, persistent vomiting, large amounts of blood in stool, or any other major concerns.  For prescription assistance, may try using prescription discount sites or apps, such as  goodrx.com  COVID-19 isolation recommendations  Patients who  have symptoms consistent with COVID-19 should self isolate until: At least 3 days (72 hours) have passed since recovery, defined as resolution of fever without the use of fever reducing medications and improvement in respiratory symptoms (e.g., cough, shortness of breath), and At least 7 days have passed since symptoms first appeared. Retesting is not required and not recommended as patients can continue to test positive for several weeks despite lack of symptoms.  Your platelet level was slightly lower than normal.  Have this value retested by your primary care provider.

## 2019-07-08 NOTE — ED Triage Notes (Signed)
Pt reports testing positive for covid on Friday, symptoms began on Tuesday before. Endorses cough that was prod that began this morning, was blood tinged. Denies fevers, endorses body aches and pulse ox 87% at home. resp e/u. Ambulatory to triage.

## 2019-07-23 DIAGNOSIS — J329 Chronic sinusitis, unspecified: Secondary | ICD-10-CM | POA: Diagnosis not present

## 2019-07-23 DIAGNOSIS — E1165 Type 2 diabetes mellitus with hyperglycemia: Secondary | ICD-10-CM | POA: Diagnosis not present

## 2019-07-23 DIAGNOSIS — J40 Bronchitis, not specified as acute or chronic: Secondary | ICD-10-CM | POA: Diagnosis not present

## 2019-07-23 DIAGNOSIS — G4700 Insomnia, unspecified: Secondary | ICD-10-CM | POA: Diagnosis not present

## 2019-08-12 DIAGNOSIS — Z7689 Persons encountering health services in other specified circumstances: Secondary | ICD-10-CM | POA: Diagnosis not present

## 2019-08-12 DIAGNOSIS — N200 Calculus of kidney: Secondary | ICD-10-CM | POA: Diagnosis not present

## 2019-08-12 DIAGNOSIS — R600 Localized edema: Secondary | ICD-10-CM | POA: Diagnosis not present

## 2019-08-12 DIAGNOSIS — R319 Hematuria, unspecified: Secondary | ICD-10-CM | POA: Diagnosis not present

## 2019-08-13 DIAGNOSIS — N200 Calculus of kidney: Secondary | ICD-10-CM | POA: Diagnosis not present

## 2019-08-13 DIAGNOSIS — R310 Gross hematuria: Secondary | ICD-10-CM | POA: Diagnosis not present

## 2019-10-24 DIAGNOSIS — R600 Localized edema: Secondary | ICD-10-CM | POA: Diagnosis not present

## 2019-10-24 DIAGNOSIS — E782 Mixed hyperlipidemia: Secondary | ICD-10-CM | POA: Diagnosis not present

## 2019-10-24 DIAGNOSIS — Z79899 Other long term (current) drug therapy: Secondary | ICD-10-CM | POA: Diagnosis not present

## 2019-10-24 DIAGNOSIS — R0600 Dyspnea, unspecified: Secondary | ICD-10-CM | POA: Diagnosis not present

## 2019-10-24 DIAGNOSIS — E1165 Type 2 diabetes mellitus with hyperglycemia: Secondary | ICD-10-CM | POA: Diagnosis not present

## 2019-10-28 DIAGNOSIS — R0600 Dyspnea, unspecified: Secondary | ICD-10-CM | POA: Diagnosis not present

## 2019-11-07 ENCOUNTER — Encounter: Payer: Self-pay | Admitting: *Deleted

## 2019-11-07 ENCOUNTER — Encounter: Payer: Self-pay | Admitting: Cardiology

## 2019-11-07 DIAGNOSIS — N200 Calculus of kidney: Secondary | ICD-10-CM

## 2019-11-07 HISTORY — DX: Calculus of kidney: N20.0

## 2019-11-14 DIAGNOSIS — Z79899 Other long term (current) drug therapy: Secondary | ICD-10-CM | POA: Diagnosis not present

## 2019-11-14 DIAGNOSIS — E559 Vitamin D deficiency, unspecified: Secondary | ICD-10-CM | POA: Diagnosis not present

## 2019-11-14 DIAGNOSIS — E1169 Type 2 diabetes mellitus with other specified complication: Secondary | ICD-10-CM | POA: Diagnosis not present

## 2019-11-14 DIAGNOSIS — E782 Mixed hyperlipidemia: Secondary | ICD-10-CM | POA: Diagnosis not present

## 2019-11-18 ENCOUNTER — Encounter: Payer: Self-pay | Admitting: Cardiology

## 2019-11-18 ENCOUNTER — Ambulatory Visit: Payer: Medicare Other | Admitting: Cardiology

## 2019-11-18 ENCOUNTER — Other Ambulatory Visit: Payer: Self-pay

## 2019-11-18 VITALS — BP 110/70 | HR 102 | Ht 67.5 in | Wt 228.0 lb

## 2019-11-18 DIAGNOSIS — R072 Precordial pain: Secondary | ICD-10-CM

## 2019-11-18 DIAGNOSIS — I5189 Other ill-defined heart diseases: Secondary | ICD-10-CM | POA: Insufficient documentation

## 2019-11-18 DIAGNOSIS — I10 Essential (primary) hypertension: Secondary | ICD-10-CM

## 2019-11-18 DIAGNOSIS — E785 Hyperlipidemia, unspecified: Secondary | ICD-10-CM

## 2019-11-18 DIAGNOSIS — E118 Type 2 diabetes mellitus with unspecified complications: Secondary | ICD-10-CM

## 2019-11-18 DIAGNOSIS — Z6834 Body mass index (BMI) 34.0-34.9, adult: Secondary | ICD-10-CM

## 2019-11-18 HISTORY — DX: Precordial pain: R07.2

## 2019-11-18 HISTORY — DX: Essential (primary) hypertension: I10

## 2019-11-18 HISTORY — DX: Hyperlipidemia, unspecified: E78.5

## 2019-11-18 HISTORY — DX: Type 2 diabetes mellitus with unspecified complications: E11.8

## 2019-11-18 HISTORY — DX: Other ill-defined heart diseases: I51.89

## 2019-11-18 MED ORDER — ASPIRIN EC 81 MG PO TBEC
81.0000 mg | DELAYED_RELEASE_TABLET | Freq: Every day | ORAL | 3 refills | Status: DC
Start: 2019-11-18 — End: 2020-08-17

## 2019-11-18 NOTE — Progress Notes (Signed)
Cardiology Consultation:    Date:  11/18/2019   ID:  Frank Ware, DOB 1950/06/25, MRN [id number]  PCP:  Street, [name], MD  Cardiologist:  Jenne Campus, MD   Referring MD: [address] [name], *   No chief complaint on file. I have swollen legs  History of Present Illness:    Frank Ware is a 69 y.o. male who is being seen today for the evaluation of leg swelling at the request of Street, [name], *.  Past medical history significant for diabetes, essential hypertension, dyslipidemia, ex-smoker but smoked only for very few years, was referred to [place] because of swelling of lower extremities that he developed during the summertime.  He was put on torsemide with very good response and now there is no problem.  However, he also got echocardiogram done which showed low normal ejection fraction 2248%, diastolic dysfunction.  He does have mild left atrium enlargement.  He was sent to [place] to be evaluated for those issues.  On top of that he does have multiple risk factors for coronary artery disease namely dyslipidemia hypertension diabetes and family history of coronary artery disease.  To be evaluated for coronary artery disease.  He described to have some chest pain happen sometimes with exercise.  He tells me that he lives a very sedentary lifestyle.  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  He sits at the desk all the time.  Goes for walks sometimes but not much and he admits that this is a problem.  He gets short of breath quite easily.  Does snore at night but not sure if he does have sleep apnea.  Past Medical History:  Diagnosis Date  . Acute pain 12/27/2018  . Arthropathy of cervical facet joint 12/14/2016  . Bilateral occipital neuralgia 10/13/2016  . Body mass index 34.0-34.9, adult 09/02/2013  . BPPV (benign paroxysmal positional vertigo)   . Cervical pseudoarthrosis, sequela 02/07/2017  . Cervical spinal stenosis 10/13/2016  . Chronic pain syndrome 10/13/2016    . Duodenal ulcer    Non-bleeding on EGD, March 2021  . GAD (generalized anxiety disorder) 04/06/2015  . Gastroesophageal reflux disease without esophagitis   . Hiatal hernia   . High cholesterol   . History of kidney stones   . Hypertension   . Kidney stone 11/07/2019  . Mild episode of recurrent major depressive disorder ([place]) 04/06/2015  . OSA (obstructive sleep apnea)   . Postlaminectomy syndrome of cervical region 10/13/2016  . Presence of right artificial knee joint 10/13/2016  . Primary localized osteoarthrosis, lower leg 10/13/2016  . Trochanteric bursitis of right hip 10/13/2016  . Type 2 diabetes mellitus with hyperglycemia, without long-term current use of insulin ([place])   . Unilateral primary osteoarthritis, right knee 10/13/2016  . Vitamin D deficiency     Past Surgical History:  Procedure Laterality Date  . CERVICAL FUSION     C3-C4 1992 and C5-C6 2001.  [name] CERVICAL FUSION     x2  . CYSTOSCOPY/URETEROSCOPY/HOLMIUM LASER/STENT PLACEMENT Left 11/22/2018   Procedure: CYSTOSCOPY LEFT URETEROSCOPY/HOLMIUM LASER/STENT EXCHANGE;  Surgeon: Irine Seal, MD;  Location: [facility];  Service: Urology;  Laterality: Left;  . CYSTOSCOPY/URETEROSCOPY/HOLMIUM LASER/STENT PLACEMENT Left 12/25/2018   Procedure: CYSTOSCOPY LEFT RETROGRADE PYELOGRAM LEFT URETEROSCOPY WITH HOLMIUM LASER LEFT STENT EXCHANGE;  Surgeon: Irine Seal, MD;  [facility];  Service: Urology;  Laterality: Left;  . KNEE ARTHROPLASTY Right   . KNEE ARTHROSCOPY Bilateral 1985  .  LITHOTRIPSY    . TONSILLECTOMY      Current Medications: Current Meds  Medication Sig  . albuterol (VENTOLIN HFA) 108 (90 Base) MCG/ACT inhaler Inhale 1-2 puffs into the lungs every 6 (six) hours as needed for wheezing or shortness of breath.  [name] amLODipine (NORVASC) 2.5 MG tablet Take 2.5 mg by mouth daily.  . Ascorbic Acid (VITAMIN C) 500 MG CAPS Take by mouth daily.  [name] atorvastatin (LIPITOR) 20 MG  tablet Take 20 mg by mouth daily.   . Blood Glucose Monitoring Suppl (ONE TOUCH ULTRA 2) w/Device KIT USE AS DIRECTED ONCE DAILY  . buprenorphine (SUBUTEX) 2 MG SUBL SL tablet Place 2 mg under the tongue as needed (pain).  [name] glipiZIDE (GLUCOTROL XL) 10 MG 24 hr tablet Take 10 mg by mouth in the morning and at bedtime.   [name] glucose blood (ONETOUCH ULTRA) test strip USE 1 STRIP DAILY AS DIRECTED  . metFORMIN (GLUCOPHAGE) 1000 MG tablet Take 1,000 mg by mouth 2 (two) times daily with a meal.   . olmesartan (BENICAR) 40 MG tablet Take 40 mg by mouth daily.   . ondansetron (ZOFRAN-ODT) 8 MG disintegrating tablet Take 8 mg by mouth every 8 (eight) hours as needed for nausea or vomiting.   Glory Rosebush Delica Lancets 16X MISC USE 1 LANCET DAILY  . pioglitazone (ACTOS) 45 MG tablet Take 45 mg by mouth daily.   . potassium chloride SA (KLOR-CON) 20 MEQ tablet Take 20 mEq by mouth daily.  [name] torsemide (DEMADEX) 5 MG tablet Take 5 mg by mouth daily as needed (As needed for swelling).  . traMADol (ULTRAM) 50 MG tablet Take by mouth every 6 (six) hours as needed.   [name] VITAMIN D PO Take by mouth daily.  . Zinc 50 MG CAPS Take by mouth daily.  [name] zolpidem (AMBIEN) 10 MG tablet Take 10 mg by mouth at bedtime as needed for sleep.     Allergies:   Patient has no known allergies.   Social History   Socioeconomic History  . Marital status: Married    Spouse name: Not on file  . Number of children: Not on file  . Years of education: Not on file  . Highest education level: Not on file  Occupational History  . Occupation: retired Interior and spatial designer  Tobacco Use  . Smoking status: Former Smoker    Types: Cigarettes    Quit date: 11/07/1994    Years since quitting: 25.0  . Smokeless tobacco: Never Used  Vaping Use  . Vaping Use: Never used  Substance and Sexual Activity  . Alcohol use: Not Currently  . Drug use: Never  . Sexual activity: Yes  Other Topics Concern  . Not on file  Social History  Narrative  . Not on file   Social Determinants of Health   Financial Resource Strain:   . Difficulty of Paying Living Expenses: Not on file  Food Insecurity:   . Worried About Charity fundraiser in the Last Year: Not on file  . Ran Out of Food in the Last Year: Not on file  Transportation Needs:   . Lack of Transportation (Medical): Not on file  . Lack of Transportation (Non-Medical): Not on file  Physical Activity:   . Days of Exercise per Week: Not on file  . Minutes of Exercise per Session: Not on file  Stress:   . Feeling of Stress : Not on file  Social Connections:   . Frequency of Communication with Friends  and Family: Not on file  . Frequency of Social Gatherings with Friends and Family: Not on file  . Attends Religious Services: Not on file  . Active Member of Clubs or Organizations: Not on file  . Attends Archivist Meetings: Not on file  . Marital Status: Not on file     Family History: [name] family history includes Arthritis in his mother and sister; Diabetes in his maternal grandfather; Heart attack in his paternal grandfather; Hypertension in his maternal grandfather; Lymphoma in his brother; Pancreatic cancer in his brother. There is no history of Colon cancer, Colon polyps, Esophageal cancer, Rectal cancer, or Stomach cancer. ROS:   Please see the history of present illness.    All 14 point review of systems negative except as described per history of present illness.  EKGs/Labs/Other Studies Reviewed:    The following studies were reviewed today: Echocardiogram from hospital reviewed showed preserved left ventricle ejection fraction 5055%, impaired relaxation, mild left atrium enlargement, no significant valvular pathology  EKG:  EKG is  ordered today.  The ekg ordered today demonstrates normal sinus rhythm, normal P interval, normal QS complex duration morphology  Recent Labs: 07/08/2019: ALT 21; BUN 24; Creatinine, Ser 1.18; Hemoglobin  14.1; Platelets 146; Potassium 4.7; Sodium 134  Recent Lipid Panel No results found for: CHOL, TRIG, HDL, CHOLHDL, VLDL, LDLCALC, LDLDIRECT  Physical Exam:    VS:  BP 110/70 (BP Location: Left Arm, Patient Position: Sitting, Cuff Size: Normal)   Pulse (!) 102   Ht 5' 7.5" (1.715 m)   Wt 228 lb (103.4 kg)   SpO2 96%   BMI 35.18 kg/m     Wt Readings from Last 3 Encounters:  11/18/19 228 lb (103.4 kg)  10/24/19 230 lb (104.3 kg)  04/04/19 222 lb (100.7 kg)     GEN:  Well nourished, well developed in no acute distress HEENT: Normal NECK: No JVD; No carotid bruits LYMPHATICS: No lymphadenopathy CARDIAC: RRR, no murmurs, no rubs, no gallops RESPIRATORY:  Clear to auscultation without rales, wheezing or rhonchi  ABDOMEN: Soft, non-tender, non-distended MUSCULOSKELETAL:  No edema; No deformity  SKIN: Warm and dry NEUROLOGIC:  Alert and oriented x 3 PSYCHIATRIC:  Normal affect   ASSESSMENT:    1. Body mass index 34.0-34.9, adult   2. Precordial chest pain   3. Diastolic dysfunction   4. Essential hypertension   5. Dyslipidemia   6. Type 2 diabetes mellitus with complication, without long-term current use of insulin (HCC)    PLAN:    In order of problems listed above:  1. Diastolic dysfunction based on echocardiogram which probably is normal aging process.  He does have mild enlargement of the left atrium.  His swelling has been successfully managed with diuretics.  His blood pressures well controlled.  I will continue present management. 2. Precordial chest pain he is somewhat vague on the description however with multiple risk factors for coronary artery disease I think we need to pursue stress testing.  I will schedule him to have [place]. 3. Dyslipidemia: I did review his K PN he is LDL is 55 and HDL of 42 this is from 11/14/2019 which is just few days ago.  He is taking Lipitor 20 which I will continue. 4. Diabetes mellitus very well controlled.  I did review his K PN  showing me A1c of 7.0 from 4 days ago.  We will continue present management. 5. I anticipate in the future we will continue discussion about risk factors  modifications he is doing pretty good job with his blood pressure control cholesterol however diabetes mellitus B mild tension, on top of that he need to be more active which he is now.  This is something we will continue.  In the meantime ask you to start taking 1 baby aspirin a day   Medication Adjustments/Labs and Tests Ordered: Current medicines are reviewed at length with the patient today.  Concerns regarding medicines are outlined above.  No orders of the defined types were placed in this encounter.  No orders of the defined types were placed in this encounter.   Signed, Park Liter, MD, [facility]. 11/18/2019 9:59 AM    [facility]

## 2019-11-18 NOTE — Addendum Note (Signed)
Addended by: Senaida Ores on: 11/18/2019 10:12 AM   Modules accepted: Orders

## 2019-11-18 NOTE — Patient Instructions (Signed)
Medication Instructions:  Your physician has recommended you make the following change in your medication:   START: Aspirin 81 mg daily   *If you need a refill on your cardiac medications before your next appointment, please call your pharmacy*   Lab Work: None If you have labs (blood work) drawn today and your tests are completely normal, you will receive your results only by: [name] MyChart Message (if you have MyChart) OR . A paper copy in the mail If you have any lab test that is abnormal or we need to change your treatment, we will call you to review the results.   Testing/Procedures:   [facility] [address] [place], [place] [address] Phone:  [phone]    Please arrive 15 minutes prior to your appointment time for registration and insurance purposes.  The test will take approximately 3 to 4 hours to complete; you may bring reading material.  If someone comes with you to your appointment, they will need to remain in the main lobby due to limited space in the testing area. **If you are pregnant or breastfeeding, please notify the nuclear lab prior to your appointment**  How to prepare for your Myocardial Perfusion Test: . Do not eat or drink 3 hours prior to your test, except you may have water. . Do not consume products containing caffeine (regular or decaffeinated) 12 hours prior to your test. (ex: coffee, chocolate, sodas, tea). . Do bring a list of your current medications with you.  If not listed below, you may take your medications as normal.  HOLD diabetic medication/insulin the morning of the test: glipizide, metformin   . Do wear comfortable clothes (no dresses or overalls) and walking shoes, tennis shoes preferred (No heels or open toe shoes are allowed). . Do NOT wear cologne, perfume, aftershave, or lotions (deodorant is allowed). . If these instructions are not followed, your test will have to be rescheduled.  Please report to [address] for your test.  If you have questions or concerns about your appointment, you can call the [facility] at [phone].  If you cannot keep your appointment, please provide 24 hours notification to the Nuclear Lab, to avoid a possible $50 charge to your account.    Follow-Up: At [facility], you and your health needs are our priority.  As part of our continuing mission to provide you with exceptional heart care, we have created designated Provider Care Teams.  These Care Teams include your primary Cardiologist (physician) and Advanced Practice Providers (APPs -  Physician Assistants and Nurse Practitioners) who all work together to provide you with the care you need, when you need it.  We recommend signing up for the patient portal called "MyChart".  Sign up information is provided on this After Visit Summary.  MyChart is used to connect with patients for Virtual Visits (Telemedicine).  Patients are able to view lab/test results, encounter notes, upcoming appointments, etc.  Non-urgent messages can be sent to your provider as well.   To learn more about what you can do with MyChart, go to [email or web address].    Your next appointment:   6 week(s)  The format for your next appointment:   In Person  Provider:   Jenne Campus, MD   Other Instructions   Cardiac Nuclear Scan A cardiac nuclear scan is a test that measures blood flow to the heart when a person is resting and when he or she is  exercising. The test looks for problems such as:  Not enough blood reaching a portion of the heart.  The heart muscle not working normally. You may need this test if:  You have heart disease.  You have had abnormal lab results.  You have had heart surgery or a balloon procedure to open up blocked arteries (angioplasty).  You have chest pain.  You have shortness of breath. In this test, a radioactive dye (tracer) is injected into your  bloodstream. After the tracer has traveled to your heart, an imaging device is used to measure how much of the tracer is absorbed by or distributed to various areas of your heart. This procedure is usually done at a hospital and takes 2-4 hours. Tell a health care provider about:  Any allergies you have.  All medicines you are taking, including vitamins, herbs, eye drops, creams, and over-the-counter medicines.  Any problems you or family members have had with anesthetic medicines.  Any blood disorders you have.  Any surgeries you have had.  Any medical conditions you have.  Whether you are pregnant or may be pregnant. What are the risks? Generally, this is a safe procedure. However, problems may occur, including:  Serious chest pain and heart attack. This is only a risk if the stress portion of the test is done.  Rapid heartbeat.  Sensation of warmth in your chest. This usually passes quickly.  Allergic reaction to the tracer. What happens before the procedure?  Ask your health care provider about changing or stopping your regular medicines. This is especially important if you are taking diabetes medicines or blood thinners.  Follow instructions from your health care provider about eating or drinking restrictions.  Remove your jewelry on the day of the procedure. What happens during the procedure?  An IV will be inserted into one of your veins.  Your health care provider will inject a small amount of radioactive tracer through the IV.  You will wait for 20-40 minutes while the tracer travels through your bloodstream.  Your heart activity will be monitored with an electrocardiogram (ECG).  You will lie down on an exam table.  Images of your heart will be taken for about 15-20 minutes.  You may also have a stress test. For this test, one of the following may be done: ? You will exercise on a treadmill or stationary bike. While you exercise, your heart's activity will be  monitored with an ECG, and your blood pressure will be checked. ? You will be given medicines that will increase blood flow to parts of your heart. This is done if you are unable to exercise.  When blood flow to your heart has peaked, a tracer will again be injected through the IV.  After 20-40 minutes, you will get back on the exam table and have more images taken of your heart.  Depending on the type of tracer used, scans may need to be repeated 3-4 hours later.  Your IV line will be removed when the procedure is over. The procedure may vary among health care providers and hospitals. What happens after the procedure?  Unless your health care provider tells you otherwise, you may return to your normal schedule, including diet, activities, and medicines.  Unless your health care provider tells you otherwise, you may increase your fluid intake. This will help to flush the contrast dye from your body. Drink enough fluid to keep your urine pale yellow.  Ask your health care provider, or the department that  is doing the test: ? When will my results be ready? ? How will I get my results? Summary  A cardiac nuclear scan measures the blood flow to the heart when a person is resting and when he or she is exercising.  Tell your health care provider if you are pregnant.  Before the procedure, ask your health care provider about changing or stopping your regular medicines. This is especially important if you are taking diabetes medicines or blood thinners.  After the procedure, unless your health care provider tells you otherwise, increase your fluid intake. This will help flush the contrast dye from your body.  After the procedure, unless your health care provider tells you otherwise, you may return to your normal schedule, including diet, activities, and medicines. This information is not intended to replace advice given to you by your health care provider. Make sure you discuss any questions  you have with your health care provider. Document Revised: 06/12/2017 Document Reviewed: 06/12/2017 Elsevier Patient Education  [place].

## 2019-11-20 ENCOUNTER — Telehealth: Payer: Self-pay | Admitting: *Deleted

## 2019-11-20 NOTE — Telephone Encounter (Signed)
Left message on voicemail per DPR in reference to upcoming appointment scheduled on 11/27/2019 at [facility] with detailed instructions given per Myocardial Perfusion Study Information Sheet for the test. LM to arrive 15 minutes early, and that it is imperative to arrive on time for appointment to keep from having the test rescheduled. If you need to cancel or reschedule your appointment, please call the office within 24 hours of your appointment. Failure to do so may result in a cancellation of your appointment, and a $50 no show fee. Phone number given for call back for any questions. No mychart available.Lenyx Boody, Ranae Palms

## 2019-11-27 ENCOUNTER — Ambulatory Visit (INDEPENDENT_AMBULATORY_CARE_PROVIDER_SITE_OTHER): Payer: Medicare Other

## 2019-11-27 ENCOUNTER — Other Ambulatory Visit: Payer: Self-pay

## 2019-11-27 DIAGNOSIS — R072 Precordial pain: Secondary | ICD-10-CM | POA: Diagnosis not present

## 2019-11-27 LAB — MYOCARDIAL PERFUSION IMAGING
LV dias vol: 76 mL (ref 62–150)
LV sys vol: 27 mL
Peak HR: 90 {beats}/min
Rest HR: 76 {beats}/min
SDS: 3
SRS: 4
SSS: 7
TID: 1.04

## 2019-11-27 MED ORDER — TECHNETIUM TC 99M TETROFOSMIN IV KIT
31.3000 | PACK | Freq: Once | INTRAVENOUS | Status: AC | PRN
  Administered 2019-11-27: 31.3 via INTRAVENOUS

## 2019-11-27 MED ORDER — TECHNETIUM TC 99M TETROFOSMIN IV KIT
10.9000 | PACK | Freq: Once | INTRAVENOUS | Status: AC | PRN
  Administered 2019-11-27: 10.9 via INTRAVENOUS

## 2019-11-27 MED ORDER — REGADENOSON 0.4 MG/5ML IV SOLN
0.4000 mg | Freq: Once | INTRAVENOUS | Status: AC
  Administered 2019-11-27: 0.4 mg via INTRAVENOUS

## 2019-12-02 DIAGNOSIS — Z87442 Personal history of urinary calculi: Secondary | ICD-10-CM | POA: Insufficient documentation

## 2019-12-02 DIAGNOSIS — K269 Duodenal ulcer, unspecified as acute or chronic, without hemorrhage or perforation: Secondary | ICD-10-CM | POA: Insufficient documentation

## 2019-12-02 DIAGNOSIS — K449 Diaphragmatic hernia without obstruction or gangrene: Secondary | ICD-10-CM | POA: Insufficient documentation

## 2019-12-02 DIAGNOSIS — E559 Vitamin D deficiency, unspecified: Secondary | ICD-10-CM | POA: Insufficient documentation

## 2019-12-02 DIAGNOSIS — E1165 Type 2 diabetes mellitus with hyperglycemia: Secondary | ICD-10-CM | POA: Insufficient documentation

## 2019-12-02 DIAGNOSIS — K219 Gastro-esophageal reflux disease without esophagitis: Secondary | ICD-10-CM | POA: Insufficient documentation

## 2019-12-02 DIAGNOSIS — H8110 Benign paroxysmal vertigo, unspecified ear: Secondary | ICD-10-CM | POA: Insufficient documentation

## 2019-12-02 DIAGNOSIS — G4733 Obstructive sleep apnea (adult) (pediatric): Secondary | ICD-10-CM | POA: Insufficient documentation

## 2019-12-02 DIAGNOSIS — I10 Essential (primary) hypertension: Secondary | ICD-10-CM | POA: Insufficient documentation

## 2019-12-02 DIAGNOSIS — E7800 Pure hypercholesterolemia, unspecified: Secondary | ICD-10-CM | POA: Insufficient documentation

## 2019-12-03 ENCOUNTER — Ambulatory Visit: Payer: Medicare Other | Admitting: Cardiology

## 2019-12-03 ENCOUNTER — Encounter: Payer: Self-pay | Admitting: Cardiology

## 2019-12-03 ENCOUNTER — Other Ambulatory Visit: Payer: Self-pay

## 2019-12-03 VITALS — BP 100/70 | HR 90 | Ht 67.0 in | Wt 227.0 lb

## 2019-12-03 DIAGNOSIS — I5189 Other ill-defined heart diseases: Secondary | ICD-10-CM | POA: Diagnosis not present

## 2019-12-03 DIAGNOSIS — R9439 Abnormal result of other cardiovascular function study: Secondary | ICD-10-CM | POA: Diagnosis not present

## 2019-12-03 DIAGNOSIS — E1165 Type 2 diabetes mellitus with hyperglycemia: Secondary | ICD-10-CM

## 2019-12-03 DIAGNOSIS — I10 Essential (primary) hypertension: Secondary | ICD-10-CM

## 2019-12-03 HISTORY — DX: Abnormal result of other cardiovascular function study: R94.39

## 2019-12-03 MED ORDER — RANOLAZINE ER 500 MG PO TB12
500.0000 mg | ORAL_TABLET | Freq: Two times a day (BID) | ORAL | 2 refills | Status: DC
Start: 2019-12-03 — End: 2020-01-08

## 2019-12-03 MED ORDER — NITROGLYCERIN 0.4 MG SL SUBL
0.4000 mg | SUBLINGUAL_TABLET | SUBLINGUAL | 11 refills | Status: DC | PRN
Start: 2019-12-03 — End: 2021-08-20

## 2019-12-03 NOTE — Addendum Note (Signed)
Addended by: Senaida Ores on: 12/03/2019 10:19 AM   Modules accepted: Orders

## 2019-12-03 NOTE — Patient Instructions (Signed)
Medication Instructions:  Your physician has recommended you make the following change in your medication:   START: Ranexa 500 mg twice daily   TAKE AS NEEDED FOR CHEST PAIN: Nitroglycerin 0.4 mg sublingual (under your tongue) as needed for chest pain. If experiencing chest pain, stop what you are doing and sit down. Take 1 nitroglycerin and wait 5 minutes. If chest pain continues, take another nitroglycerin and wait 5 minutes. If chest pain does not subside, take 1 more nitroglycerin and dial 911. You make take a total of 3 nitroglycerin in a 15 minute time frame.   *If you need a refill on your cardiac medications before your next appointment, please call your pharmacy*   Lab Work: None If you have labs (blood work) drawn today and your tests are completely normal, you will receive your results only by: [name] MyChart Message (if you have MyChart) OR . A paper copy in the mail If you have any lab test that is abnormal or we need to change your treatment, we will call you to review the results.   Testing/Procedures: None   Follow-Up: At [facility], you and your health needs are our priority.  As part of our continuing mission to provide you with exceptional heart care, we have created designated Provider Care Teams.  These Care Teams include your primary Cardiologist (physician) and Advanced Practice Providers (APPs -  Physician Assistants and Nurse Practitioners) who all work together to provide you with the care you need, when you need it.  We recommend signing up for the patient portal called "MyChart".  Sign up information is provided on this After Visit Summary.  MyChart is used to connect with patients for Virtual Visits (Telemedicine).  Patients are able to view lab/test results, encounter notes, upcoming appointments, etc.  Non-urgent messages can be sent to your provider as well.   To learn more about what you can do with MyChart, go to [email or web address].    Your next  appointment:   6 week(s)  The format for your next appointment:   In Person  Provider:   Jenne Campus, MD   Other Instructions  Ranolazine tablets, extended release What is this medicine? RANOLAZINE (ra NOE la zeen) is a heart medicine. It is used to treat chronic chest pain (angina). This medicine must be taken regularly. It will not relieve an acute episode of chest pain. This medicine may be used for other purposes; ask your health care provider or pharmacist if you have questions. COMMON BRAND NAME(S): Ranexa What should I tell my health care provider before I take this medicine? They need to know if you have any of these conditions:  heart disease  irregular heartbeat  kidney disease  liver disease  low levels of potassium or magnesium in the blood  an unusual or allergic reaction to ranolazine, other medicines, foods, dyes, or preservatives  pregnant or trying to get pregnant  breast-feeding How should I use this medicine? Take this medicine by mouth with a glass of water. Follow the directions on the prescription label. Do not cut, crush, or chew this medicine. Take with or without food. Do not take this medication with grapefruit juice. Take your doses at regular intervals. Do not take your medicine more often then directed. Talk to your pediatrician regarding the use of this medicine in children. Special care may be needed. Overdosage: If you think you have taken too much of this medicine contact a poison control center or emergency room at once. NOTE:  This medicine is only for you. Do not share this medicine with others. What if I miss a dose? If you miss a dose, take it as soon as you can. If it is almost time for your next dose, take only that dose. Do not take double or extra doses. What may interact with this medicine? Do not take this medicine with any of the following medications:  antivirals for HIV or AIDS  cerivastatin  certain antibiotics like  chloramphenicol, clarithromycin, dalfopristin; quinupristin, isoniazid, rifabutin, rifampin, rifapentine  certain medicines used for cancer like imatinib, nilotinib  certain medicines for fungal infections like fluconazole, itraconazole, ketoconazole, posaconazole, voriconazole  certain medicines for irregular heart beat like dronedarone  certain medicines for seizures like carbamazepine, fosphenytoin, oxcarbazepine, phenobarbital, phenytoin  cisapride  conivaptan  cyclosporine  grapefruit or grapefruit juice  lumacaftor; ivacaftor  nefazodone  pimozide  quinacrine  St John's wort  thioridazine This medicine may also interact with the following medications:  alfuzosin  certain medicines for depression, anxiety, or psychotic disturbances like bupropion, citalopram, fluoxetine, fluphenazine, paroxetine, perphenazine, risperidone, sertraline, trifluoperazine  certain medicines for cholesterol like atorvastatin, lovastatin, simvastatin  certain medicines for stomach problems like octreotide, palonosetron, prochlorperazine  eplerenone  ergot alkaloids like dihydroergotamine, ergonovine, ergotamine, methylergonovine  metformin  nicardipine  other medicines that prolong the QT interval (cause an abnormal heart rhythm) like dofetilide, ziprasidone  sirolimus  tacrolimus This list may not describe all possible interactions. Give your health care provider a list of all the medicines, herbs, non-prescription drugs, or dietary supplements you use. Also tell them if you smoke, drink alcohol, or use illegal drugs. Some items may interact with your medicine. What should I watch for while using this medicine? Visit your doctor for regular check ups. Tell your doctor or healthcare professional if your symptoms do not start to get better or if they get worse. This medicine will not relieve an acute attack of angina or chest pain. This medicine can change your heart rhythm. Your  health care provider may check your heart rhythm by ordering an electrocardiogram (ECG) while you are taking this medicine. You may get drowsy or dizzy. Do not drive, use machinery, or do anything that needs mental alertness until you know how this medicine affects you. Do not stand or sit up quickly, especially if you are an older patient. This reduces the risk of dizzy or fainting spells. Alcohol may interfere with the effect of this medicine. Avoid alcoholic drinks. If you are scheduled for any medical or dental procedure, tell your healthcare provider that you are taking this medicine. This medicine can interact with other medicines used during surgery. What side effects may I notice from receiving this medicine? Side effects that you should report to your doctor or health care professional as soon as possible:  allergic reactions like skin rash, itching or hives, swelling of the face, lips, or tongue  breathing problems  changes in vision  fast, irregular or pounding heartbeat  feeling faint or lightheaded, falls  low or high blood pressure  numbness or tingling feelings  ringing in the ears  tremor or shakiness  slow heartbeat (fewer than 50 beats per minute)  swelling of the legs or feet Side effects that usually do not require medical attention (report to your doctor or health care professional if they continue or are bothersome):  constipation  drowsy  dry mouth  headache  nausea or vomiting  stomach upset This list may not describe all possible side effects. Call  your doctor for medical advice about side effects. You may report side effects to FDA at 1-800-FDA-1088. Where should I keep my medicine? Keep out of the reach of children. Store at room temperature between 15 and 30 degrees C (59 and 86 degrees F). Throw away any unused medicine after the expiration date. NOTE: This sheet is a summary. It may not cover all possible information. If you have questions  about this medicine, talk to your doctor, pharmacist, or health care provider.  2020 Elsevier/Gold Standard (2017-12-19 09:18:49)  Nitroglycerin sublingual tablets What is this medicine? NITROGLYCERIN (nye troe GLI ser in) is a type of vasodilator. It relaxes blood vessels, increasing the blood and oxygen supply to your heart. This medicine is used to relieve chest pain caused by angina. It is also used to prevent chest pain before activities like climbing stairs, going outdoors in cold weather, or sexual activity. This medicine may be used for other purposes; ask your health care provider or pharmacist if you have questions. COMMON BRAND NAME(S): Nitroquick, Nitrostat, Nitrotab What should I tell my health care provider before I take this medicine? They need to know if you have any of these conditions:  anemia  head injury, recent stroke, or bleeding in the brain  liver disease  previous heart attack  an unusual or allergic reaction to nitroglycerin, other medicines, foods, dyes, or preservatives  pregnant or trying to get pregnant  breast-feeding How should I use this medicine? Take this medicine by mouth as needed. At the first sign of an angina attack (chest pain or tightness) place one tablet under your tongue. You can also take this medicine 5 to 10 minutes before an event likely to produce chest pain. Follow the directions on the prescription label. Let the tablet dissolve under the tongue. Do not swallow whole. Replace the dose if you accidentally swallow it. It will help if your mouth is not dry. Saliva around the tablet will help it to dissolve more quickly. Do not eat or drink, smoke or chew tobacco while a tablet is dissolving. If you are not better within 5 minutes after taking ONE dose of nitroglycerin, call 9-1-1 immediately to seek emergency medical care. Do not take more than 3 nitroglycerin tablets over 15 minutes. If you take this medicine often to relieve symptoms of  angina, your doctor or health care professional may provide you with different instructions to manage your symptoms. If symptoms do not go away after following these instructions, it is important to call 9-1-1 immediately. Do not take more than 3 nitroglycerin tablets over 15 minutes. Talk to your pediatrician regarding the use of this medicine in children. Special care may be needed. Overdosage: If you think you have taken too much of this medicine contact a poison control center or emergency room at once. NOTE: This medicine is only for you. Do not share this medicine with others. What if I miss a dose? This does not apply. This medicine is only used as needed. What may interact with this medicine? Do not take this medicine with any of the following medications:  certain migraine medicines like ergotamine and dihydroergotamine (DHE)  medicines used to treat erectile dysfunction like sildenafil, tadalafil, and vardenafil  riociguat This medicine may also interact with the following medications:  alteplase  aspirin  heparin  medicines for high blood pressure  medicines for mental depression  other medicines used to treat angina  phenothiazines like chlorpromazine, mesoridazine, prochlorperazine, thioridazine This list may not describe all possible  interactions. Give your health care provider a list of all the medicines, herbs, non-prescription drugs, or dietary supplements you use. Also tell them if you smoke, drink alcohol, or use illegal drugs. Some items may interact with your medicine. What should I watch for while using this medicine? Tell your doctor or health care professional if you feel your medicine is no longer working. Keep this medicine with you at all times. Sit or lie down when you take your medicine to prevent falling if you feel dizzy or faint after using it. Try to remain calm. This will help you to feel better faster. If you feel dizzy, take several deep breaths and  lie down with your feet propped up, or bend forward with your head resting between your knees. You may get drowsy or dizzy. Do not drive, use machinery, or do anything that needs mental alertness until you know how this drug affects you. Do not stand or sit up quickly, especially if you are an older patient. This reduces the risk of dizzy or fainting spells. Alcohol can make you more drowsy and dizzy. Avoid alcoholic drinks. Do not treat yourself for coughs, colds, or pain while you are taking this medicine without asking your doctor or health care professional for advice. Some ingredients may increase your blood pressure. What side effects may I notice from receiving this medicine? Side effects that you should report to your doctor or health care professional as soon as possible:  blurred vision  dry mouth  skin rash  sweating  the feeling of extreme pressure in the head  unusually weak or tired Side effects that usually do not require medical attention (report to your doctor or health care professional if they continue or are bothersome):  flushing of the face or neck  headache  irregular heartbeat, palpitations  nausea, vomiting This list may not describe all possible side effects. Call your doctor for medical advice about side effects. You may report side effects to FDA at 1-800-FDA-1088. Where should I keep my medicine? Keep out of the reach of children. Store at room temperature between 20 and 25 degrees C (68 and 77 degrees F). Store in [facility] [employer]. Protect from light and moisture. Keep tightly closed. Throw away any unused medicine after the expiration date. NOTE: This sheet is a summary. It may not cover all possible information. If you have questions about this medicine, talk to your doctor, pharmacist, or health care provider.  2020 Elsevier/Gold Standard (2012-10-25 17:57:36)

## 2019-12-03 NOTE — Progress Notes (Signed)
Cardiology Office Note:    Date:  12/03/2019   ID:  Frank Ware, DOB 24-May-1950, MRN [id number]  PCP:  Street, [name], MD  Cardiologist:  Jenne Campus, MD    Referring MD: Street, [name], *   Chief Complaint  Patient presents with  . Follow-up    Stress test  Undefined  History of Present Illness:    Frank Ware is a 69 y.o. male with past medical history significant for essential hypertension, dyslipidemia, diabetes, ex-smoker came today to my office to discuss results of his stress test.  Stress test was abnormal showing mild area of ischemia involving basal portion of the inferior wall.  He denies have any chest pain recently however in the summer he described few episode of chest tightness when he was working hard.  He does not exercise on the regular basis but he likes to work in the garden.  Past Medical History:  Diagnosis Date  . Acute pain 12/27/2018  . Arthropathy of cervical facet joint 12/14/2016  . Bilateral occipital neuralgia 10/13/2016  . Body mass index 34.0-34.9, adult 09/02/2013  . BPPV (benign paroxysmal positional vertigo)   . Cervical pseudoarthrosis, sequela 02/07/2017  . Cervical spinal stenosis 10/13/2016  . Chronic pain syndrome 10/13/2016  . Diastolic dysfunction 18/02/9935  . Duodenal ulcer    Non-bleeding on EGD, March 2021  . Dyslipidemia 11/18/2019  . Essential hypertension 11/18/2019  . GAD (generalized anxiety disorder) 04/06/2015  . Gastroesophageal reflux disease without esophagitis   . Hiatal hernia   . High cholesterol   . History of kidney stones   . Hypertension   . Kidney stone 11/07/2019  . Mild episode of recurrent major depressive disorder ([name]) 04/06/2015  . OSA (obstructive sleep apnea)   . Postlaminectomy syndrome of cervical region 10/13/2016  . Precordial chest pain 11/18/2019  . Presence of right artificial knee joint 10/13/2016  . Primary localized osteoarthrosis, lower leg 10/13/2016  . Trochanteric bursitis of  right hip 10/13/2016  . Type 2 diabetes mellitus with complication, without long-term current use of insulin ([name]) 11/18/2019  . Type 2 diabetes mellitus with hyperglycemia, without long-term current use of insulin ([name])   . Unilateral primary osteoarthritis, right knee 10/13/2016  . Vitamin D deficiency     Past Surgical History:  Procedure Laterality Date  . CERVICAL FUSION     C3-C4 1992 and C5-C6 2001.  [name] CERVICAL FUSION     x2  . CYSTOSCOPY/URETEROSCOPY/HOLMIUM LASER/STENT PLACEMENT Left 11/22/2018   Procedure: CYSTOSCOPY LEFT URETEROSCOPY/HOLMIUM LASER/STENT EXCHANGE;  Surgeon: Irine Seal, MD;  Location: [facility];  Service: Urology;  Laterality: Left;  . CYSTOSCOPY/URETEROSCOPY/HOLMIUM LASER/STENT PLACEMENT Left 12/25/2018   Procedure: CYSTOSCOPY LEFT RETROGRADE PYELOGRAM LEFT URETEROSCOPY WITH HOLMIUM LASER LEFT STENT EXCHANGE;  Surgeon: Irine Seal, MD;  Location: [facility];  Service: Urology;  Laterality: Left;  . KNEE ARTHROPLASTY Right   . KNEE ARTHROSCOPY Bilateral 1985  . LITHOTRIPSY    . TONSILLECTOMY      Current Medications: Current Meds  Medication Sig  . albuterol (VENTOLIN HFA) 108 (90 Base) MCG/ACT inhaler Inhale 1-2 puffs into the lungs every 6 (six) hours as needed for wheezing or shortness of breath.  [name] amLODipine (NORVASC) 2.5 MG tablet Take 2.5 mg by mouth daily.  . Ascorbic Acid (VITAMIN C) 500 MG CAPS Take by mouth daily.  [name] aspirin EC 81 MG tablet Take 1 tablet (81 mg total) by mouth daily. Swallow whole.  [name] atorvastatin (LIPITOR) 20 MG tablet  Take 20 mg by mouth daily.   . Blood Glucose Monitoring Suppl (ONE TOUCH ULTRA 2) w/Device KIT USE AS DIRECTED ONCE DAILY  . buprenorphine (SUBUTEX) 2 MG SUBL SL tablet Place 2 mg under the tongue as needed (pain).  [name] glipiZIDE (GLUCOTROL XL) 10 MG 24 hr tablet Take 10 mg by mouth in the morning and at bedtime.   [name] glucose blood (ONETOUCH ULTRA) test strip USE 1 STRIP DAILY AS  DIRECTED  . metFORMIN (GLUCOPHAGE) 1000 MG tablet Take 1,000 mg by mouth 2 (two) times daily with a meal.   . mupirocin ointment (BACTROBAN) 2 % Apply 1 application topically 3 (three) times daily.  [name] olmesartan (BENICAR) 40 MG tablet Take 40 mg by mouth daily.   . ondansetron (ZOFRAN-ODT) 8 MG disintegrating tablet Take 8 mg by mouth every 8 (eight) hours as needed for nausea or vomiting.   Glory Rosebush Delica Lancets 32G MISC USE 1 LANCET DAILY  . pioglitazone (ACTOS) 45 MG tablet Take 45 mg by mouth daily.   . potassium chloride SA (KLOR-CON) 20 MEQ tablet Take 20 mEq by mouth daily.  [name] torsemide (DEMADEX) 5 MG tablet Take 5 mg by mouth daily as needed (As needed for swelling).  . traMADol (ULTRAM) 50 MG tablet Take by mouth every 6 (six) hours as needed.   [name] VITAMIN D PO Take by mouth daily.  . Zinc 50 MG CAPS Take by mouth daily.  [name] zolpidem (AMBIEN) 10 MG tablet Take 10 mg by mouth at bedtime as needed for sleep.     Allergies:   Patient has no known allergies.   Social History   Socioeconomic History  . Marital status: Married    Spouse name: Not on file  . Number of children: Not on file  . Years of education: Not on file  . Highest education level: Not on file  Occupational History  . Occupation: retired Interior and spatial designer  Tobacco Use  . Smoking status: Former Smoker    Types: Cigarettes    Quit date: 11/07/1994    Years since quitting: 25.0  . Smokeless tobacco: Never Used  Vaping Use  . Vaping Use: Never used  Substance and Sexual Activity  . Alcohol use: Not Currently  . Drug use: Never  . Sexual activity: Yes  Other Topics Concern  . Not on file  Social History Narrative  . Not on file   Social Determinants of Health   Financial Resource Strain:   . Difficulty of Paying Living Expenses: Not on file  Food Insecurity:   . Worried About Charity fundraiser in the Last Year: Not on file  . Ran Out of Food in the Last Year: Not on file  Transportation Needs:    . Lack of Transportation (Medical): Not on file  . Lack of Transportation (Non-Medical): Not on file  Physical Activity:   . Days of Exercise per Week: Not on file  . Minutes of Exercise per Session: Not on file  Stress:   . Feeling of Stress : Not on file  Social Connections:   . Frequency of Communication with Friends and Family: Not on file  . Frequency of Social Gatherings with Friends and Family: Not on file  . Attends Religious Services: Not on file  . Active Member of Clubs or Organizations: Not on file  . Attends Archivist Meetings: Not on file  . Marital Status: Not on file     Family History: [name] family history  includes Arthritis in his mother and sister; Diabetes in his maternal grandfather; Heart attack in his paternal grandfather; Hypertension in his maternal grandfather; Lymphoma in his brother; Pancreatic cancer in his brother. There is no history of Colon cancer, Colon polyps, Esophageal cancer, Rectal cancer, or Stomach cancer. ROS:   Please see the history of present illness.    All 14 point review of systems negative except as described per history of present illness  EKGs/Labs/Other Studies Reviewed:      Recent Labs: 07/08/2019: ALT 21; BUN 24; Creatinine, Ser 1.18; Hemoglobin 14.1; Platelets 146; Potassium 4.7; Sodium 134  Recent Lipid Panel No results found for: CHOL, TRIG, HDL, CHOLHDL, VLDL, LDLCALC, LDLDIRECT  Physical Exam:    VS:  BP 100/70 (BP Location: Right Arm, Patient Position: Sitting, Cuff Size: Normal)   Pulse 90   Ht 5' 7"  (1.702 m)   Wt 227 lb (103 kg)   SpO2 97%   BMI 35.55 kg/m     Wt Readings from Last 3 Encounters:  12/03/19 227 lb (103 kg)  11/27/19 228 lb (103.4 kg)  11/18/19 228 lb (103.4 kg)     GEN:  Well nourished, well developed in no acute distress HEENT: Normal NECK: No JVD; No carotid bruits LYMPHATICS: No lymphadenopathy CARDIAC: RRR, no murmurs, no rubs, no gallops RESPIRATORY:  Clear to  auscultation without rales, wheezing or rhonchi  ABDOMEN: Soft, non-tender, non-distended MUSCULOSKELETAL:  No edema; No deformity  SKIN: Warm and dry LOWER EXTREMITIES: no swelling NEUROLOGIC:  Alert and oriented x 3 PSYCHIATRIC:  Normal affect   ASSESSMENT:    1. Essential hypertension   2. Abnormal stress test   3. Type 2 diabetes mellitus with hyperglycemia, without long-term current use of insulin ([place])   4. Diastolic dysfunction    PLAN:    In order of problems listed above:  1. Abnormal stress test only small abnormality involving basal portion of the inferior wall.  We discussed option for the situation option being cardiac catheterization versus medications.  Since he is does not have much symptoms right now he preferred to try medications first.  Therefore, I will put him on ranolazine, I would not go with beta-blocker nitroglycerin because of his blood pressure being on the lower side.  He is already on amlodipine which I will continue he is also on aspirin.  I will see him back in my office in about 6 weeks to see how he does. 2. Essential hypertension his blood pressures well controlled actually on the lower side.  I will continue present medications. 3. Type 2 diabetes I did review his K PN I see his hemoglobin A1c of 7.0 from 11/14/2019. 4. Dyslipidemia well-controlled data from K PN from 11/14/2019 showing LDL of 55 and HDL 42.  We discussed the fact that if he still having symptoms still having more chest pain he need to take nitroglycerin on as-needed basis and let me know.  Also ask him to let me know if he decided to pursue cardiac catheterization.  Overall however stress test is relatively low risks   Medication Adjustments/Labs and Tests Ordered: Current medicines are reviewed at length with the patient today.  Concerns regarding medicines are outlined above.  No orders of the defined types were placed in this encounter.  Medication changes: No orders of the defined  types were placed in this encounter.   Signed, Park Liter, MD, [facility] 12/03/2019 10:13 AM    [place]

## 2019-12-10 DIAGNOSIS — E1165 Type 2 diabetes mellitus with hyperglycemia: Secondary | ICD-10-CM | POA: Diagnosis not present

## 2019-12-10 DIAGNOSIS — Z0000 Encounter for general adult medical examination without abnormal findings: Secondary | ICD-10-CM | POA: Diagnosis not present

## 2019-12-10 DIAGNOSIS — I201 Angina pectoris with documented spasm: Secondary | ICD-10-CM | POA: Diagnosis not present

## 2019-12-10 DIAGNOSIS — I25119 Atherosclerotic heart disease of native coronary artery with unspecified angina pectoris: Secondary | ICD-10-CM | POA: Diagnosis not present

## 2019-12-10 DIAGNOSIS — E782 Mixed hyperlipidemia: Secondary | ICD-10-CM | POA: Diagnosis not present

## 2019-12-19 DIAGNOSIS — Z5181 Encounter for therapeutic drug level monitoring: Secondary | ICD-10-CM | POA: Diagnosis not present

## 2019-12-19 DIAGNOSIS — G894 Chronic pain syndrome: Secondary | ICD-10-CM | POA: Diagnosis not present

## 2019-12-19 DIAGNOSIS — M5481 Occipital neuralgia: Secondary | ICD-10-CM | POA: Diagnosis not present

## 2019-12-19 DIAGNOSIS — M961 Postlaminectomy syndrome, not elsewhere classified: Secondary | ICD-10-CM | POA: Diagnosis not present

## 2019-12-27 ENCOUNTER — Ambulatory Visit: Payer: Medicare Other | Admitting: Cardiology

## 2019-12-31 DIAGNOSIS — Z5181 Encounter for therapeutic drug level monitoring: Secondary | ICD-10-CM | POA: Diagnosis not present

## 2020-01-08 ENCOUNTER — Other Ambulatory Visit: Payer: Self-pay | Admitting: Cardiology

## 2020-01-08 NOTE — Telephone Encounter (Signed)
Rx refill sent to pharmacy. 

## 2020-01-13 ENCOUNTER — Other Ambulatory Visit: Payer: Self-pay

## 2020-01-15 ENCOUNTER — Other Ambulatory Visit: Payer: Self-pay

## 2020-01-15 ENCOUNTER — Ambulatory Visit: Payer: Medicare Other | Admitting: Cardiology

## 2020-02-04 ENCOUNTER — Ambulatory Visit: Payer: Medicare Other | Admitting: Cardiology

## 2020-02-04 ENCOUNTER — Encounter: Payer: Self-pay | Admitting: Cardiology

## 2020-02-04 ENCOUNTER — Other Ambulatory Visit: Payer: Self-pay

## 2020-02-04 VITALS — BP 120/72 | HR 70 | Ht 67.0 in | Wt 241.0 lb

## 2020-02-04 DIAGNOSIS — I5189 Other ill-defined heart diseases: Secondary | ICD-10-CM

## 2020-02-04 DIAGNOSIS — R9439 Abnormal result of other cardiovascular function study: Secondary | ICD-10-CM | POA: Diagnosis not present

## 2020-02-04 DIAGNOSIS — E118 Type 2 diabetes mellitus with unspecified complications: Secondary | ICD-10-CM | POA: Diagnosis not present

## 2020-02-04 DIAGNOSIS — I10 Essential (primary) hypertension: Secondary | ICD-10-CM | POA: Diagnosis not present

## 2020-02-04 MED ORDER — RANOLAZINE ER 500 MG PO TB12: 500.0000 mg | ORAL_TABLET | Freq: Two times a day (BID) | ORAL | 2 refills | Status: DC

## 2020-02-04 NOTE — Patient Instructions (Signed)
Medication Instructions:  Your physician recommends that you continue on your current medications as directed. Please refer to the Current Medication list given to you today.  *If you need a refill on your cardiac medications before your next appointment, please call your pharmacy*   Lab Work: [place] If you have labs (blood work) drawn today and your tests are completely normal, you will receive your results only by: [name] MyChart Message (if you have MyChart) OR . A paper copy in the mail If you have any lab test that is abnormal or we need to change your treatment, we will call you to review the results.   Testing/Procedures: None   Follow-Up: At [facility], you and your health needs are our priority.  As part of our continuing mission to provide you with exceptional heart care, we have created designated Provider Care Teams.  These Care Teams include your primary Cardiologist (physician) and Advanced Practice Providers (APPs -  Physician Assistants and Nurse Practitioners) who all work together to provide you with the care you need, when you need it.  We recommend signing up for the patient portal called "MyChart".  Sign up information is provided on this After Visit Summary.  MyChart is used to connect with patients for Virtual Visits (Telemedicine).  Patients are able to view lab/test results, encounter notes, upcoming appointments, etc.  Non-urgent messages can be sent to your provider as well.   To learn more about what you can do with MyChart, go to [email or web address].    Your next appointment:   3 month(s)  The format for your next appointment:   In Person  Provider:   Jenne Campus, MD   Other Instructions

## 2020-02-04 NOTE — Progress Notes (Signed)
Cardiology Office Note:    Date:  02/04/2020   ID:  Frank Ware, DOB 04-17-1950, MRN [id number]  PCP:  Street, [name], MD  Cardiologist:  Jenne Campus, MD    Referring MD: Street, [name], *   Chief Complaint  Patient presents with  . Follow-up  I am doing better  History of Present Illness:    Frank Ware is a 70 y.o. male with past medical history significant for hypertension dyslipidemia diabetes he is an ex-smoker, swelling of lower extremities, minimally abnormal stress test.  Comes today to my office for follow-up last time we had a discussion about options for his stress test that he stress to show mild area of ischemia involving basal portion of the inferior wall.  He prefer conservative approach, I put him on ranolazine he tells me today he is doing better at the same time he does not exercise much.  He is majority of time sitting at home.  Weather is quite poor lately there is a snow however he did not shovel the snow he hired somebody to do that.  But overall tells me that he is feeling better with ranolazine and he requests refill on this medication.  We revisited again the issue of potential cardiac catheterization he does not want to have it he prefers conservative approach the way we do right now.  Past Medical History:  Diagnosis Date  . Abnormal stress test 12/03/2019  . Acute pain 12/27/2018  . Arthropathy of cervical facet joint 12/14/2016  . Bilateral occipital neuralgia 10/13/2016  . Body mass index 34.0-34.9, adult 09/02/2013  . BPPV (benign paroxysmal positional vertigo)   . Cervical pseudoarthrosis, sequela 02/07/2017  . Cervical spinal stenosis 10/13/2016  . Chronic pain syndrome 10/13/2016  . Diastolic dysfunction 36/06/4401  . Duodenal ulcer    Non-bleeding on EGD, March 2021  . Dyslipidemia 11/18/2019  . Encounter for therapeutic drug monitoring 10/13/2016  . Essential hypertension 11/18/2019  . GAD (generalized anxiety disorder) 04/06/2015  .  Gastroesophageal reflux disease without esophagitis   . Hiatal hernia   . High cholesterol   . History of kidney stones   . Hypertension   . Kidney stone 11/07/2019  . Mild episode of recurrent major depressive disorder ([place]) 04/06/2015  . OSA (obstructive sleep apnea)   . Postlaminectomy syndrome of cervical region 10/13/2016  . Precordial chest pain 11/18/2019  . Presence of right artificial knee joint 10/13/2016  . Primary localized osteoarthrosis, lower leg 10/13/2016  . Trochanteric bursitis of right hip 10/13/2016  . Type 2 diabetes mellitus with complication, without long-term current use of insulin ([place]) 11/18/2019  . Type 2 diabetes mellitus with hyperglycemia, without long-term current use of insulin ([place])   . Unilateral primary osteoarthritis, right knee 10/13/2016  . Vitamin D deficiency     Past Surgical History:  Procedure Laterality Date  . CERVICAL FUSION     C3-C4 1992 and C5-C6 2001.  [name] CERVICAL FUSION     x2  . CYSTOSCOPY/URETEROSCOPY/HOLMIUM LASER/STENT PLACEMENT Left 11/22/2018   Procedure: CYSTOSCOPY LEFT URETEROSCOPY/HOLMIUM LASER/STENT EXCHANGE;  Surgeon: Irine Seal, MD;  Location: [facility];  Service: Urology;  Laterality: Left;  . CYSTOSCOPY/URETEROSCOPY/HOLMIUM LASER/STENT PLACEMENT Left 12/25/2018   Procedure: CYSTOSCOPY LEFT RETROGRADE PYELOGRAM LEFT URETEROSCOPY WITH HOLMIUM LASER LEFT STENT EXCHANGE;  Surgeon: Irine Seal, MD;  Location: [facility];  Service: Urology;  Laterality: Left;  . KNEE ARTHROPLASTY Right   . KNEE ARTHROSCOPY Bilateral 1985  . LITHOTRIPSY    .  TONSILLECTOMY      Current Medications: Current Meds  Medication Sig  . albuterol (VENTOLIN HFA) 108 (90 Base) MCG/ACT inhaler Inhale 1-2 puffs into the lungs every 6 (six) hours as needed for wheezing or shortness of breath.  [name] amLODipine (NORVASC) 2.5 MG tablet Take 2.5 mg by mouth daily.  . Ascorbic Acid (VITAMIN C) 500 MG CAPS Take by mouth daily.  [name]  aspirin EC 81 MG tablet Take 1 tablet (81 mg total) by mouth daily. Swallow whole.  [name] atorvastatin (LIPITOR) 20 MG tablet Take 20 mg by mouth daily.   . Blood Glucose Monitoring Suppl (ONE TOUCH ULTRA 2) w/Device KIT USE AS DIRECTED ONCE DAILY  . buprenorphine (SUBUTEX) 2 MG SUBL SL tablet Place 2 mg under the tongue as needed (pain).  [name] glipiZIDE (GLUCOTROL XL) 10 MG 24 hr tablet Take 10 mg by mouth in the morning and at bedtime.   [name] glucose blood (ONETOUCH ULTRA) test strip USE 1 STRIP DAILY AS DIRECTED  . metFORMIN (GLUCOPHAGE) 1000 MG tablet Take 1,000 mg by mouth 2 (two) times daily with a meal.   . mupirocin ointment (BACTROBAN) 2 % Apply 1 application topically 3 (three) times daily.  . nitroGLYCERIN (NITROSTAT) 0.4 MG SL tablet Place 1 tablet (0.4 mg total) under the tongue every 5 (five) minutes as needed.  [name] olmesartan (BENICAR) 40 MG tablet Take 40 mg by mouth daily.   [name] omeprazole (PRILOSEC) 40 MG capsule Take 40 mg by mouth daily.  . ondansetron (ZOFRAN-ODT) 8 MG disintegrating tablet Take 8 mg by mouth every 8 (eight) hours as needed for nausea or vomiting.   Glory Rosebush Delica Lancets 17R MISC USE 1 LANCET DAILY  . pioglitazone (ACTOS) 45 MG tablet Take 45 mg by mouth daily.   . potassium chloride SA (KLOR-CON) 20 MEQ tablet Take 20 mEq by mouth daily.  . ranolazine (RANEXA) 500 MG 12 hr tablet TAKE 1 TABLET BY MOUTH TWICE A DAY  . torsemide (DEMADEX) 5 MG tablet Take 5 mg by mouth daily as needed (As needed for swelling).  . traMADol (ULTRAM) 50 MG tablet Take by mouth every 6 (six) hours as needed.   [name] VITAMIN D PO Take by mouth daily.  . Zinc 50 MG CAPS Take by mouth daily.  [name] zolpidem (AMBIEN) 10 MG tablet Take 10 mg by mouth at bedtime as needed for sleep.     Allergies:   Patient has no known allergies.   Social History   Socioeconomic History  . Marital status: Married    Spouse name: Not on file  . Number of children: Not on file  . Years of education: Not on file   . Highest education level: Not on file  Occupational History  . Occupation: retired Interior and spatial designer  Tobacco Use  . Smoking status: Former Smoker    Types: Cigarettes    Quit date: 11/07/1994    Years since quitting: 25.2  . Smokeless tobacco: Never Used  Vaping Use  . Vaping Use: Never used  Substance and Sexual Activity  . Alcohol use: Not Currently  . Drug use: Never  . Sexual activity: Yes  Other Topics Concern  . Not on file  Social History Narrative  . Not on file   Social Determinants of Health   Financial Resource Strain: Not on file  Food Insecurity: Not on file  Transportation Needs: Not on file  Physical Activity: Not on file  Stress: Not on file  Social Connections: Not on  file     Family History: [name] family history includes Arthritis in his mother and sister; Diabetes in his maternal grandfather; Heart attack in his paternal grandfather; Hypertension in his maternal grandfather; Lymphoma in his brother; Pancreatic cancer in his brother. There is no history of Colon cancer, Colon polyps, Esophageal cancer, Rectal cancer, or Stomach cancer. ROS:   Please see the history of present illness.    All 14 point review of systems negative except as described per history of present illness  EKGs/Labs/Other Studies Reviewed:      Recent Labs: 07/08/2019: ALT 21; BUN 24; Creatinine, Ser 1.18; Hemoglobin 14.1; Platelets 146; Potassium 4.7; Sodium 134  Recent Lipid Panel No results found for: CHOL, TRIG, HDL, CHOLHDL, VLDL, LDLCALC, LDLDIRECT  Physical Exam:    VS:  BP 120/72 (BP Location: Left Arm, Patient Position: Sitting, Cuff Size: Normal)   Pulse 70   Ht 5' 7"  (1.702 m)   Wt 241 lb (109.3 kg)   SpO2 94%   BMI 37.75 kg/m     Wt Readings from Last 3 Encounters:  02/04/20 241 lb (109.3 kg)  12/03/19 227 lb (103 kg)  11/27/19 228 lb (103.4 kg)     GEN:  Well nourished, well developed in no acute distress HEENT: Normal NECK: No JVD; No  carotid bruits LYMPHATICS: No lymphadenopathy CARDIAC: RRR, no murmurs, no rubs, no gallops RESPIRATORY:  Clear to auscultation without rales, wheezing or rhonchi  ABDOMEN: Soft, non-tender, non-distended MUSCULOSKELETAL:  No edema; No deformity  SKIN: Warm and dry LOWER EXTREMITIES: no swelling NEUROLOGIC:  Alert and oriented x 3 PSYCHIATRIC:  Normal affect   ASSESSMENT:    1. Abnormal stress test   2. Diastolic dysfunction   3. Essential hypertension   4. Type 2 diabetes mellitus with complication, without long-term current use of insulin ([facility])    PLAN:    In order of problems listed above:  1. Abnormal stress test showing mild area of ischemia involving basal portion of the inferior wall.  He favors medical therapy we will continue with antiplatelet therapy, statin as well as antianginal medication which include ranolazine 5 mg twice daily. 2. Diastolic function seems to be compensated still minimal swelling of lower extremity which is chronic. 3. Essential hypertension blood pressure well controlled. 4. Type 2 diabetes I do have his last fasting lipid profile/hemoglobin A1c with hemoglobin A1c of 7.0 from 11/14/2019.  That followed by antimedicine team. 5. Dyslipidemia he is on Lipitor 20 which I will continue.  He is LDL is 55 HDL 42 this is acceptable cholesterol profile.  We talked in length about the situation I told him to start having chest pain he need to let me know I encouraged him to be L be more active.  Cardiac catheterization is still in as a potential option.  However, for now he is happy the way he is.   Medication Adjustments/Labs and Tests Ordered: Current medicines are reviewed at length with the patient today.  Concerns regarding medicines are outlined above.  No orders of the defined types were placed in this encounter.  Medication changes: No orders of the defined types were placed in this encounter.   Signed, Park Liter, MD, [facility] 02/04/2020  10:22 AM    [place]

## 2020-02-04 NOTE — Addendum Note (Signed)
Addended by: Senaida Ores on: 02/04/2020 10:34 AM   Modules accepted: Orders

## 2020-02-10 DIAGNOSIS — G894 Chronic pain syndrome: Secondary | ICD-10-CM | POA: Diagnosis not present

## 2020-02-10 DIAGNOSIS — M47812 Spondylosis without myelopathy or radiculopathy, cervical region: Secondary | ICD-10-CM | POA: Diagnosis not present

## 2020-02-25 DIAGNOSIS — M47812 Spondylosis without myelopathy or radiculopathy, cervical region: Secondary | ICD-10-CM | POA: Diagnosis not present

## 2020-04-09 DIAGNOSIS — J40 Bronchitis, not specified as acute or chronic: Secondary | ICD-10-CM | POA: Diagnosis not present

## 2020-04-09 DIAGNOSIS — J329 Chronic sinusitis, unspecified: Secondary | ICD-10-CM | POA: Diagnosis not present

## 2020-04-09 DIAGNOSIS — I5032 Chronic diastolic (congestive) heart failure: Secondary | ICD-10-CM | POA: Diagnosis not present

## 2020-04-09 DIAGNOSIS — I110 Hypertensive heart disease with heart failure: Secondary | ICD-10-CM | POA: Diagnosis not present

## 2020-04-29 DIAGNOSIS — E559 Vitamin D deficiency, unspecified: Secondary | ICD-10-CM | POA: Diagnosis not present

## 2020-04-29 DIAGNOSIS — E785 Hyperlipidemia, unspecified: Secondary | ICD-10-CM | POA: Diagnosis not present

## 2020-04-29 DIAGNOSIS — Z79899 Other long term (current) drug therapy: Secondary | ICD-10-CM | POA: Diagnosis not present

## 2020-04-29 DIAGNOSIS — E1169 Type 2 diabetes mellitus with other specified complication: Secondary | ICD-10-CM | POA: Diagnosis not present

## 2020-05-04 DIAGNOSIS — I201 Angina pectoris with documented spasm: Secondary | ICD-10-CM | POA: Diagnosis not present

## 2020-05-04 DIAGNOSIS — D500 Iron deficiency anemia secondary to blood loss (chronic): Secondary | ICD-10-CM | POA: Diagnosis not present

## 2020-05-04 DIAGNOSIS — I25119 Atherosclerotic heart disease of native coronary artery with unspecified angina pectoris: Secondary | ICD-10-CM | POA: Diagnosis not present

## 2020-05-04 DIAGNOSIS — G4733 Obstructive sleep apnea (adult) (pediatric): Secondary | ICD-10-CM | POA: Diagnosis not present

## 2020-05-04 DIAGNOSIS — E782 Mixed hyperlipidemia: Secondary | ICD-10-CM | POA: Diagnosis not present

## 2020-05-04 DIAGNOSIS — I110 Hypertensive heart disease with heart failure: Secondary | ICD-10-CM | POA: Diagnosis not present

## 2020-05-04 DIAGNOSIS — N200 Calculus of kidney: Secondary | ICD-10-CM | POA: Diagnosis not present

## 2020-05-04 DIAGNOSIS — E559 Vitamin D deficiency, unspecified: Secondary | ICD-10-CM | POA: Diagnosis not present

## 2020-05-04 DIAGNOSIS — R310 Gross hematuria: Secondary | ICD-10-CM | POA: Diagnosis not present

## 2020-05-04 DIAGNOSIS — E1165 Type 2 diabetes mellitus with hyperglycemia: Secondary | ICD-10-CM | POA: Diagnosis not present

## 2020-05-04 DIAGNOSIS — K2960 Other gastritis without bleeding: Secondary | ICD-10-CM | POA: Diagnosis not present

## 2020-05-04 DIAGNOSIS — I5032 Chronic diastolic (congestive) heart failure: Secondary | ICD-10-CM | POA: Diagnosis not present

## 2020-05-05 ENCOUNTER — Other Ambulatory Visit: Payer: Self-pay | Admitting: Cardiology

## 2020-05-06 NOTE — Telephone Encounter (Signed)
Rx refill sent to pharmacy. 

## 2020-05-08 DIAGNOSIS — M961 Postlaminectomy syndrome, not elsewhere classified: Secondary | ICD-10-CM | POA: Diagnosis not present

## 2020-05-08 DIAGNOSIS — M5481 Occipital neuralgia: Secondary | ICD-10-CM | POA: Diagnosis not present

## 2020-05-08 DIAGNOSIS — G894 Chronic pain syndrome: Secondary | ICD-10-CM | POA: Diagnosis not present

## 2020-05-21 ENCOUNTER — Ambulatory Visit: Payer: Medicare Other | Admitting: Cardiology

## 2020-05-22 DIAGNOSIS — E1165 Type 2 diabetes mellitus with hyperglycemia: Secondary | ICD-10-CM | POA: Insufficient documentation

## 2020-05-27 ENCOUNTER — Encounter: Payer: Self-pay | Admitting: Cardiology

## 2020-05-27 ENCOUNTER — Other Ambulatory Visit: Payer: Self-pay

## 2020-05-27 ENCOUNTER — Ambulatory Visit: Payer: Medicare Other | Admitting: Cardiology

## 2020-05-27 VITALS — BP 108/66 | HR 77 | Ht 66.0 in | Wt 238.0 lb

## 2020-05-27 DIAGNOSIS — R072 Precordial pain: Secondary | ICD-10-CM | POA: Diagnosis not present

## 2020-05-27 DIAGNOSIS — I10 Essential (primary) hypertension: Secondary | ICD-10-CM | POA: Diagnosis not present

## 2020-05-27 DIAGNOSIS — R9439 Abnormal result of other cardiovascular function study: Secondary | ICD-10-CM | POA: Diagnosis not present

## 2020-05-27 DIAGNOSIS — E118 Type 2 diabetes mellitus with unspecified complications: Secondary | ICD-10-CM

## 2020-05-27 DIAGNOSIS — E785 Hyperlipidemia, unspecified: Secondary | ICD-10-CM | POA: Diagnosis not present

## 2020-05-27 MED ORDER — RANOLAZINE ER 1000 MG PO TB12: 1000.0000 mg | ORAL_TABLET | Freq: Two times a day (BID) | ORAL | 5 refills | Status: DC

## 2020-05-27 NOTE — Progress Notes (Signed)
Cardiology Office Note:    Date:  05/27/2020   ID:  Frank Ware, DOB [birth year], MRN [id number]  PCP:  Street, [name], MD  Cardiologist:  Jenne Campus, MD    Referring MD: Street, [name], *   Chief Complaint  Patient presents with  . Follow-up    Chest pain x3weeks ago    History of Present Illness:    Frank Ware is a 70 y.o. male with past medical history significant for essential hypertension, diabetes, he is an ex-smoker, chronic swelling of lower extremities.  He did have a stress test done few months ago which showed minimal abnormalities involving basal portion of the inferior wall he prefer conservative approach which we been doing.  I put him on ranolazine 500 mg twice daily with quite good response he comes today to my office for follow-up he reports 2 episode of chest pain.  1 time he was working very heavily he was unloading some stuff from a wheelbarrow and still having tightness in the chest on the left side.  He took nitroglycerin with relief and then continue working and finish his work second episode was happening in the similar scenario when he was working hard.  Otherwise no problem  Past Medical History:  Diagnosis Date  . Abnormal stress test 12/03/2019  . Acute pain 12/27/2018  . Arthropathy of cervical facet joint 12/14/2016  . Bilateral occipital neuralgia 10/13/2016  . Body mass index 34.0-34.9, adult 09/02/2013  . BPPV (benign paroxysmal positional vertigo)   . Cervical pseudoarthrosis, sequela 02/07/2017  . Cervical spinal stenosis 10/13/2016  . Chronic pain syndrome 10/13/2016  . Diastolic dysfunction 33/08/2503  . Duodenal ulcer    Non-bleeding on EGD, March 2021  . Dyslipidemia 11/18/2019  . Encounter for therapeutic drug monitoring 10/13/2016  . Essential hypertension 11/18/2019  . GAD (generalized anxiety disorder) 04/06/2015  . Gastroesophageal reflux disease without esophagitis   . Hiatal hernia   . High cholesterol   . History of  kidney stones   . Hypertension   . Kidney stone 11/07/2019  . Mild episode of recurrent major depressive disorder ([place]) 04/06/2015  . OSA (obstructive sleep apnea)   . Postlaminectomy syndrome of cervical region 10/13/2016  . Precordial chest pain 11/18/2019  . Presence of right artificial knee joint 10/13/2016  . Primary localized osteoarthrosis, lower leg 10/13/2016  . Trochanteric bursitis of right hip 10/13/2016  . Type 2 diabetes mellitus with complication, without long-term current use of insulin ([place]) 11/18/2019  . Type 2 diabetes mellitus with hyperglycemia, without long-term current use of insulin ([place])   . Unilateral primary osteoarthritis, right knee 10/13/2016  . Vitamin D deficiency     Past Surgical History:  Procedure Laterality Date  . CERVICAL FUSION     C3-C4 1992 and C5-C6 2001.  [name] CERVICAL FUSION     x2  . CYSTOSCOPY/URETEROSCOPY/HOLMIUM LASER/STENT PLACEMENT Left 11/22/2018   Procedure: CYSTOSCOPY LEFT URETEROSCOPY/HOLMIUM LASER/STENT EXCHANGE;  Surgeon: Irine Seal, MD;  Location: [facility];  Service: Urology;  Laterality: Left;  . CYSTOSCOPY/URETEROSCOPY/HOLMIUM LASER/STENT PLACEMENT Left 12/25/2018   Procedure: CYSTOSCOPY LEFT RETROGRADE PYELOGRAM LEFT URETEROSCOPY WITH HOLMIUM LASER LEFT STENT EXCHANGE;  Surgeon: Irine Seal, MD;  Location: [facility];  Service: Urology;  Laterality: Left;  . KNEE ARTHROPLASTY Right   . KNEE ARTHROSCOPY Bilateral 1985  . LITHOTRIPSY    . TONSILLECTOMY      Current Medications: Current Meds  Medication Sig  . albuterol (VENTOLIN HFA) 108 (90 Base) MCG/ACT  inhaler Inhale 1-2 puffs into the lungs every 6 (six) hours as needed for wheezing or shortness of breath.  [name] amLODipine (NORVASC) 2.5 MG tablet Take 2.5 mg by mouth daily.  . Ascorbic Acid (VITAMIN C) 500 MG CAPS Take by mouth daily.  [name] aspirin EC 81 MG tablet Take 1 tablet (81 mg total) by mouth daily. Swallow whole.  [name] atorvastatin (LIPITOR)  20 MG tablet Take 20 mg by mouth daily.   . buprenorphine (SUBUTEX) 2 MG SUBL SL tablet Place 2 mg under the tongue as needed (pain).  [name] glipiZIDE (GLUCOTROL XL) 10 MG 24 hr tablet Take 10 mg by mouth in the morning and at bedtime.   . metFORMIN (GLUCOPHAGE) 1000 MG tablet Take 1,000 mg by mouth 2 (two) times daily with a meal.   . mupirocin ointment (BACTROBAN) 2 % Apply 1 application topically 3 (three) times daily.  . nitroGLYCERIN (NITROSTAT) 0.4 MG SL tablet Place 1 tablet (0.4 mg total) under the tongue every 5 (five) minutes as needed. (Patient taking differently: Place 0.4 mg under the tongue every 5 (five) minutes as needed for chest pain.)  . olmesartan (BENICAR) 40 MG tablet Take 40 mg by mouth daily.   [name] omeprazole (PRILOSEC) 40 MG capsule Take 40 mg by mouth daily.  . ondansetron (ZOFRAN) 8 MG tablet Take 8 mg by mouth every 8 (eight) hours as needed for nausea/vomiting.  . pioglitazone (ACTOS) 45 MG tablet Take 45 mg by mouth daily.   . potassium chloride SA (KLOR-CON) 20 MEQ tablet Take 20 mEq by mouth daily.  . ranolazine (RANEXA) 1000 MG SR tablet Take 1 tablet (1,000 mg total) by mouth 2 (two) times daily.  [name] torsemide (DEMADEX) 5 MG tablet Take 5 mg by mouth daily as needed (As needed for swelling).  . traMADol (ULTRAM) 50 MG tablet Take by mouth every 6 (six) hours as needed for moderate pain or severe pain.  [name] VITAMIN D PO Take 1 tablet by mouth daily. Unknown strength  . Vitamin D, Ergocalciferol, (DRISDOL) 1.25 MG (50000 UNIT) CAPS capsule Take 1 capsule by mouth once a week.  . Zinc 50 MG CAPS Take 1 capsule by mouth as needed (When immunity is low).  . zolpidem (AMBIEN) 10 MG tablet Take 10 mg by mouth at bedtime as needed for sleep.  . [DISCONTINUED] ranolazine (RANEXA) 500 MG 12 hr tablet TAKE 1 TABLET BY MOUTH TWICE A DAY (Patient taking differently: Take 500 mg by mouth 2 (two) times daily.)     Allergies:   Patient has no known allergies.   Social History    Socioeconomic History  . Marital status: Married    Spouse name: Not on file  . Number of children: Not on file  . Years of education: Not on file  . Highest education level: Not on file  Occupational History  . Occupation: retired Interior and spatial designer  Tobacco Use  . Smoking status: Former Smoker    Types: Cigarettes    Quit date: 11/07/1994    Years since quitting: 25.5  . Smokeless tobacco: Never Used  Vaping Use  . Vaping Use: Never used  Substance and Sexual Activity  . Alcohol use: Not Currently  . Drug use: Never  . Sexual activity: Yes  Other Topics Concern  . Not on file  Social History Narrative  . Not on file   Social Determinants of Health   Financial Resource Strain: Not on file  Food Insecurity: Not on file  Transportation Needs:  Not on file  Physical Activity: Not on file  Stress: Not on file  Social Connections: Not on file     Family History: [name] family history includes Arthritis in his mother and sister; Diabetes in his maternal grandfather; Heart attack in his paternal grandfather; Hypertension in his maternal grandfather; Lymphoma in his brother; Pancreatic cancer in his brother. There is no history of Colon cancer, Colon polyps, Esophageal cancer, Rectal cancer, or Stomach cancer. ROS:   Please see the history of present illness.    All 14 point review of systems negative except as described per history of present illness  EKGs/Labs/Other Studies Reviewed:      Recent Labs: 07/08/2019: ALT 21; BUN 24; Creatinine, Ser 1.18; Hemoglobin 14.1; Platelets 146; Potassium 4.7; Sodium 134  Recent Lipid Panel No results found for: CHOL, TRIG, HDL, CHOLHDL, VLDL, LDLCALC, LDLDIRECT  Physical Exam:    VS:  BP 108/66 (BP Location: Right Arm, Patient Position: Sitting)   Pulse 77   Ht 5\' 6"  (1.676 m)   Wt 238 lb (108 kg)   SpO2 90%   BMI 38.41 kg/m     Wt Readings from Last 3 Encounters:  05/27/20 238 lb (108 kg)  02/04/20 241 lb (109.3  kg)  12/03/19 227 lb (103 kg)     GEN:  Well nourished, well developed in no acute distress HEENT: Normal NECK: No JVD; No carotid bruits LYMPHATICS: No lymphadenopathy CARDIAC: RRR, no murmurs, no rubs, no gallops RESPIRATORY:  Clear to auscultation without rales, wheezing or rhonchi  ABDOMEN: Soft, non-tender, non-distended MUSCULOSKELETAL:  No edema; No deformity  SKIN: Warm and dry LOWER EXTREMITIES: no swelling NEUROLOGIC:  Alert and oriented x 3 PSYCHIATRIC:  Normal affect   ASSESSMENT:    1. Essential hypertension   2. Dyslipidemia   3. Type 2 diabetes mellitus with complication, without long-term current use of insulin ([place])   4. Abnormal stress test    PLAN:    In order of problems listed above:  1. Coronary artery disease abnormal stress test with inferior wall ischemia only small area he did have 2 episode of chest pain which happen with extreme exertion we did discuss option for the situation option being cardiac catheterization versus medical therapy he still definitely favor medical therapy.  Therefore, I will increase dose of ranolazine to 1000 mg twice daily.  I asked him to take nitroglycerin on as-needed basis.  I told him also the frequency duration or intensity of his chest pain increased to let me know also told him if he changed his mind and prefers to do cardiac catheterization will be ready to do that. 2. Dyslipidemia, I did review his K PN which showed me good cholesterol he is on statin in form of atorvastatin 20 which I will continue. 3. Diabetes mellitus, K PN show me his hemoglobin A1c from 04/29/2020 of 5.7 is in excellent control excellent job by primary care physician.  We will continue.   Medication Adjustments/Labs and Tests Ordered: Current medicines are reviewed at length with the patient today.  Concerns regarding medicines are outlined above.  Orders Placed This Encounter  Procedures  . Basic metabolic panel  . Pro b natriuretic peptide (BNP)   . EKG 12-Lead   Medication changes:  Meds ordered this encounter  Medications  . ranolazine (RANEXA) 1000 MG SR tablet    Sig: Take 1 tablet (1,000 mg total) by mouth 2 (two) times daily.    Dispense:  30 tablet  Refill:  5    Signed, Park Liter, MD, [facility] 05/27/2020 10:16 AM    [place]

## 2020-05-27 NOTE — Patient Instructions (Signed)
Medication Instructions:  Your physician has recommended you make the following change in your medication:  INCREASE RANEXA TO 1000MG  TWO TIMES DAILY  *If you need a refill on your cardiac medications before your next appointment, please call your pharmacy*   Lab Work: Your physician recommends that you return for lab work in: [place] PRO BNP  If you have labs (blood work) drawn today and your tests are completely normal, you will receive your results only by: [name] MyChart Message (if you have MyChart) OR . A paper copy in the mail If you have any lab test that is abnormal or we need to change your treatment, we will call you to review the results.   Testing/Procedures: EKG   Follow-Up: At [facility], you and your health needs are our priority.  As part of our continuing mission to provide you with exceptional heart care, we have created designated Provider Care Teams.  These Care Teams include your primary Cardiologist (physician) and Advanced Practice Providers (APPs -  Physician Assistants and Nurse Practitioners) who all work together to provide you with the care you need, when you need it.  We recommend signing up for the patient portal called "MyChart".  Sign up information is provided on this After Visit Summary.  MyChart is used to connect with patients for Virtual Visits (Telemedicine).  Patients are able to view lab/test results, encounter notes, upcoming appointments, etc.  Non-urgent messages can be sent to your provider as well.   To learn more about what you can do with MyChart, go to [email or web address].    Your next appointment:   2 month(s)  The format for your next appointment:   In Person  Provider:   Jenne Campus, MD   Other Instructions

## 2020-05-28 LAB — BASIC METABOLIC PANEL
BUN/Creatinine Ratio: 21 (ref 10–24)
BUN: 25 mg/dL (ref 8–27)
CO2: 22 mmol/L (ref 20–29)
Calcium: 10.1 mg/dL (ref 8.6–10.2)
Chloride: 102 mmol/L (ref 96–106)
Creatinine, Ser: 1.21 mg/dL (ref 0.76–1.27)
Glucose: 123 mg/dL — ABNORMAL HIGH (ref 65–99)
Potassium: 5.1 mmol/L (ref 3.5–5.2)
Sodium: 137 mmol/L (ref 134–144)
eGFR: 66 mL/min/{1.73_m2} (ref >59)

## 2020-05-28 LAB — PRO B NATRIURETIC PEPTIDE: NT-Pro BNP: 135 pg/mL (ref 0–376)

## 2020-06-23 DIAGNOSIS — R310 Gross hematuria: Secondary | ICD-10-CM | POA: Diagnosis not present

## 2020-06-23 DIAGNOSIS — N138 Other obstructive and reflux uropathy: Secondary | ICD-10-CM | POA: Diagnosis not present

## 2020-06-23 DIAGNOSIS — N200 Calculus of kidney: Secondary | ICD-10-CM | POA: Diagnosis not present

## 2020-06-23 DIAGNOSIS — D500 Iron deficiency anemia secondary to blood loss (chronic): Secondary | ICD-10-CM | POA: Diagnosis not present

## 2020-06-25 DIAGNOSIS — M1712 Unilateral primary osteoarthritis, left knee: Secondary | ICD-10-CM | POA: Diagnosis not present

## 2020-06-29 DIAGNOSIS — K7689 Other specified diseases of liver: Secondary | ICD-10-CM | POA: Diagnosis not present

## 2020-06-29 DIAGNOSIS — K4090 Unilateral inguinal hernia, without obstruction or gangrene, not specified as recurrent: Secondary | ICD-10-CM | POA: Diagnosis not present

## 2020-06-29 DIAGNOSIS — N200 Calculus of kidney: Secondary | ICD-10-CM | POA: Diagnosis not present

## 2020-06-29 DIAGNOSIS — R9341 Abnormal radiologic findings on diagnostic imaging of renal pelvis, ureter, or bladder: Secondary | ICD-10-CM | POA: Diagnosis not present

## 2020-06-29 DIAGNOSIS — N281 Cyst of kidney, acquired: Secondary | ICD-10-CM | POA: Diagnosis not present

## 2020-06-29 DIAGNOSIS — R310 Gross hematuria: Secondary | ICD-10-CM | POA: Diagnosis not present

## 2020-06-29 DIAGNOSIS — R932 Abnormal findings on diagnostic imaging of liver and biliary tract: Secondary | ICD-10-CM | POA: Diagnosis not present

## 2020-07-21 DIAGNOSIS — N3289 Other specified disorders of bladder: Secondary | ICD-10-CM | POA: Diagnosis not present

## 2020-08-04 ENCOUNTER — Other Ambulatory Visit: Payer: Self-pay

## 2020-08-06 DIAGNOSIS — G894 Chronic pain syndrome: Secondary | ICD-10-CM | POA: Diagnosis not present

## 2020-08-06 DIAGNOSIS — M47812 Spondylosis without myelopathy or radiculopathy, cervical region: Secondary | ICD-10-CM | POA: Diagnosis not present

## 2020-08-06 DIAGNOSIS — M5481 Occipital neuralgia: Secondary | ICD-10-CM | POA: Diagnosis not present

## 2020-08-06 DIAGNOSIS — Z79899 Other long term (current) drug therapy: Secondary | ICD-10-CM | POA: Diagnosis not present

## 2020-08-06 DIAGNOSIS — M961 Postlaminectomy syndrome, not elsewhere classified: Secondary | ICD-10-CM | POA: Diagnosis not present

## 2020-08-07 ENCOUNTER — Other Ambulatory Visit: Payer: Self-pay

## 2020-08-07 ENCOUNTER — Ambulatory Visit: Payer: Medicare Other | Admitting: Cardiology

## 2020-08-07 ENCOUNTER — Telehealth: Payer: Self-pay | Admitting: Cardiology

## 2020-08-07 ENCOUNTER — Encounter: Payer: Self-pay | Admitting: Cardiology

## 2020-08-07 VITALS — BP 122/76 | HR 76 | Ht 66.0 in | Wt 222.6 lb

## 2020-08-07 DIAGNOSIS — E785 Hyperlipidemia, unspecified: Secondary | ICD-10-CM | POA: Diagnosis not present

## 2020-08-07 DIAGNOSIS — I2510 Atherosclerotic heart disease of native coronary artery without angina pectoris: Secondary | ICD-10-CM

## 2020-08-07 DIAGNOSIS — I25118 Atherosclerotic heart disease of native coronary artery with other forms of angina pectoris: Secondary | ICD-10-CM | POA: Diagnosis not present

## 2020-08-07 DIAGNOSIS — E118 Type 2 diabetes mellitus with unspecified complications: Secondary | ICD-10-CM | POA: Diagnosis not present

## 2020-08-07 HISTORY — DX: Atherosclerotic heart disease of native coronary artery without angina pectoris: I25.10

## 2020-08-07 MED ORDER — RANOLAZINE ER 500 MG PO TB12: 500.0000 mg | ORAL_TABLET | Freq: Two times a day (BID) | ORAL | 3 refills | Status: DC

## 2020-08-07 NOTE — Telephone Encounter (Signed)
Spoke to the patient just now and let him know that we did not send a nebulizer for him today. We sent in a prescription for his updated dosage of Ranexa. He verbalizes understanding and thanks me for the call back.

## 2020-08-07 NOTE — Telephone Encounter (Signed)
Pt was in the office today, once he returned back home he received a notification that he was prescribed a Nebulizer, pt is not aware of this being prescribed. Please advise pt further

## 2020-08-07 NOTE — Patient Instructions (Signed)
Medication Instructions:  Your physician has recommended you make the following change in your medication:  DECREASE: Ranolazine 500 mg take one tablet by mouth twice daily. *If you need a refill on your cardiac medications before your next appointment, please call your pharmacy*   Lab Work: None If you have labs (blood work) drawn today and your tests are completely normal, you will receive your results only by: [place] (if you have MyChart) OR A paper copy in the mail If you have any lab test that is abnormal or we need to change your treatment, we will call you to review the results.   Testing/Procedures: None   Follow-Up: At [facility], you and your health needs are our priority.  As part of our continuing mission to provide you with exceptional heart care, we have created designated Provider Care Teams.  These Care Teams include your primary Cardiologist (physician) and Advanced Practice Providers (APPs -  Physician Assistants and Nurse Practitioners) who all work together to provide you with the care you need, when you need it.  We recommend signing up for the patient portal called "MyChart".  Sign up information is provided on this After Visit Summary.  MyChart is used to connect with patients for Virtual Visits (Telemedicine).  Patients are able to view lab/test results, encounter notes, upcoming appointments, etc.  Non-urgent messages can be sent to your provider as well.   To learn more about what you can do with MyChart, go to [email or web address].    Your next appointment:   4 month(s)  The format for your next appointment:   In Person  Provider:   Jenne Campus, MD   Other Instructions

## 2020-08-07 NOTE — Progress Notes (Signed)
Cardiology Office Note:    Date:  08/07/2020   ID:  Frank Ware, DOB 27-Sep-1950, MRN [id number]  PCP:  Street, [name], MD  Cardiologist:  Jenne Campus, MD    Referring MD: Street, [name], *   Chief Complaint  Patient presents with   Medication Management    History of Present Illness:    Frank Ware is a 70 y.o. male with past medical history significant for diabetes, essential hypertension, he is an ex-smoker, chronic swelling of lower extremities.  Few months ago he had a stress test done which showed minimal abnormality involving the basal portion of the inferior wall we had a long discussion about what to do with the situation he definitely favor medical therapy.  I put him on the ranolazine 500 mg twice daily with excellent response he comes today to my office for follow-up.  He denies have any chest pain tightness squeezing pressure burning chest.  He is very active he can walk climb stairs with no difficulties.  Recently he started having hematuria he did have MRI done of his urinary bladder which showed some 3 tumors.  She is scheduled to see urologist with plans to do most likely cystoscopy to make tissue diagnosis.  Worrisome is the fact that he lost significant amount of weight within the last few months.  Of course concern is about potential malignancy.  Past Medical History:  Diagnosis Date   Abnormal stress test 12/03/2019   Acute pain 12/27/2018   Arthropathy of cervical facet joint 12/14/2016   Bilateral occipital neuralgia 10/13/2016   Body mass index 34.0-34.9, adult 09/02/2013   BPPV (benign paroxysmal positional vertigo)    Cervical pseudoarthrosis, sequela 02/07/2017   Cervical spinal stenosis 10/13/2016   Chronic pain syndrome [id number]   Diastolic dysfunction [id number]   Duodenal ulcer    Non-bleeding on EGD, March 2021   Dyslipidemia 11/18/2019   Encounter for therapeutic drug monitoring 10/13/2016   Essential hypertension 11/18/2019   GAD  (generalized anxiety disorder) 04/06/2015   Gastroesophageal reflux disease without esophagitis    Hiatal hernia    High cholesterol    History of kidney stones    Hypertension    Kidney stone 11/07/2019   Mild episode of recurrent major depressive disorder ([place]) 04/06/2015   OSA (obstructive sleep apnea)    Postlaminectomy syndrome of cervical region 10/13/2016   Precordial chest pain 11/18/2019   Presence of right artificial knee joint 10/13/2016   Primary localized osteoarthrosis, lower leg 10/13/2016   Trochanteric bursitis of right hip 10/13/2016   Type 2 diabetes mellitus with complication, without long-term current use of insulin ([place]) 11/18/2019   Type 2 diabetes mellitus with hyperglycemia, without long-term current use of insulin ([place])    Unilateral primary osteoarthritis, right knee 10/13/2016   Vitamin D deficiency     Past Surgical History:  Procedure Laterality Date   CERVICAL FUSION     C3-C4 1992 and C5-C6 2001.   CERVICAL FUSION     x2   CYSTOSCOPY/URETEROSCOPY/HOLMIUM LASER/STENT PLACEMENT Left 11/22/2018   Procedure: CYSTOSCOPY LEFT URETEROSCOPY/HOLMIUM LASER/STENT EXCHANGE;  Surgeon: Irine Seal, MD;  Location: [facility];  Service: Urology;  Laterality: Left;   CYSTOSCOPY/URETEROSCOPY/HOLMIUM LASER/STENT PLACEMENT Left 12/25/2018   Procedure: CYSTOSCOPY LEFT RETROGRADE PYELOGRAM LEFT URETEROSCOPY WITH HOLMIUM LASER LEFT STENT EXCHANGE;  Surgeon: Irine Seal, MD;  Location: [facility];  Service: Urology;  Laterality: Left;   KNEE ARTHROPLASTY Right    KNEE ARTHROSCOPY Bilateral 1985   LITHOTRIPSY  TONSILLECTOMY      Current Medications: Current Meds  Medication Sig   albuterol (VENTOLIN HFA) 108 (90 Base) MCG/ACT inhaler Inhale 1-2 puffs into the lungs every 6 (six) hours as needed for wheezing or shortness of breath.   amLODipine (NORVASC) 2.5 MG tablet Take 2.5 mg by mouth daily.   Ascorbic Acid (VITAMIN C) 500 MG CAPS Take 1  tablet by mouth daily.   aspirin EC 81 MG tablet Take 1 tablet (81 mg total) by mouth daily. Swallow whole.   atorvastatin (LIPITOR) 20 MG tablet Take 20 mg by mouth daily.    buprenorphine (SUBUTEX) 2 MG SUBL SL tablet Place 2 mg under the tongue as needed (pain).   glipiZIDE (GLUCOTROL XL) 10 MG 24 hr tablet Take 10 mg by mouth in the morning and at bedtime.    metFORMIN (GLUCOPHAGE) 1000 MG tablet Take 1,000 mg by mouth 2 (two) times daily with a meal.    mupirocin ointment (BACTROBAN) 2 % Apply 1 application topically 3 (three) times daily.   nitroGLYCERIN (NITROSTAT) 0.4 MG SL tablet Place 1 tablet (0.4 mg total) under the tongue every 5 (five) minutes as needed. (Patient taking differently: Place 0.4 mg under the tongue every 5 (five) minutes as needed for chest pain.)   olmesartan (BENICAR) 40 MG tablet Take 40 mg by mouth daily.    omeprazole (PRILOSEC) 40 MG capsule Take 40 mg by mouth daily.   ondansetron (ZOFRAN) 8 MG tablet Take 8 mg by mouth every 8 (eight) hours as needed for nausea/vomiting.   pioglitazone (ACTOS) 45 MG tablet Take 45 mg by mouth daily.    potassium chloride SA (KLOR-CON) 20 MEQ tablet Take 20 mEq by mouth daily.   torsemide (DEMADEX) 5 MG tablet Take 5 mg by mouth daily as needed (As needed for swelling).   traMADol (ULTRAM) 50 MG tablet Take 50 mg by mouth every 6 (six) hours as needed for moderate pain or severe pain.   VITAMIN D PO Take 1 tablet by mouth daily. Unknown strength   Vitamin D, Ergocalciferol, (DRISDOL) 1.25 MG (50000 UNIT) CAPS capsule Take 1 capsule by mouth once a week.   Zinc 50 MG CAPS Take 1 capsule by mouth as needed (When immunity is low).   zolpidem (AMBIEN) 10 MG tablet Take 10 mg by mouth at bedtime as needed for sleep.   [DISCONTINUED] ranolazine (RANEXA) 1000 MG SR tablet Take 1 tablet (1,000 mg total) by mouth 2 (two) times daily.     Allergies:   Patient has no known allergies.   Social History   Socioeconomic History    Marital status: Married    Spouse name: Not on file   Number of children: Not on file   Years of education: Not on file   Highest education level: Not on file  Occupational History   Occupation: retired Interior and spatial designer  Tobacco Use   Smoking status: Former    Types: Cigarettes    Quit date: 11/07/1994    Years since quitting: 25.7   Smokeless tobacco: Never  Vaping Use   Vaping Use: Never used  Substance and Sexual Activity   Alcohol use: Not Currently   Drug use: Never   Sexual activity: Yes  Other Topics Concern   Not on file  Social History Narrative   Not on file   Social Determinants of Health   Financial Resource Strain: Not on file  Food Insecurity: Not on file  Transportation Needs: Not on file  Physical  Activity: Not on file  Stress: Not on file  Social Connections: Not on file     Family History: [name] family history includes Arthritis in his mother and sister; Diabetes in his maternal grandfather; Heart attack in his paternal grandfather; Hypertension in his maternal grandfather; Lymphoma in his brother; Pancreatic cancer in his brother. There is no history of Colon cancer, Colon polyps, Esophageal cancer, Rectal cancer, or Stomach cancer. ROS:   Please see the history of present illness.    All 14 point review of systems negative except as described per history of present illness  EKGs/Labs/Other Studies Reviewed:      Recent Labs: 05/27/2020: BUN 25; Creatinine, Ser 1.21; NT-Pro BNP 135; Potassium 5.1; Sodium 137  Recent Lipid Panel No results found for: CHOL, TRIG, HDL, CHOLHDL, VLDL, LDLCALC, LDLDIRECT  Physical Exam:    VS:  BP 122/76 (BP Location: Right Arm, Patient Position: Sitting)   Pulse 76   Ht '5\' 6"'$  (1.676 m)   Wt 222 lb 9.6 oz (101 kg)   SpO2 94%   BMI 35.93 kg/m     Wt Readings from Last 3 Encounters:  08/07/20 222 lb 9.6 oz (101 kg)  05/27/20 238 lb (108 kg)  02/04/20 241 lb (109.3 kg)     GEN:  Well nourished,  well developed in no acute distress HEENT: Normal NECK: No JVD; No carotid bruits LYMPHATICS: No lymphadenopathy CARDIAC: RRR, no murmurs, no rubs, no gallops RESPIRATORY:  Clear to auscultation without rales, wheezing or rhonchi  ABDOMEN: Soft, non-tender, non-distended MUSCULOSKELETAL:  No edema; No deformity  SKIN: Warm and dry LOWER EXTREMITIES: no swelling NEUROLOGIC:  Alert and oriented x 3 PSYCHIATRIC:  Normal affect   ASSESSMENT:    1. Type 2 diabetes mellitus with complication, without long-term current use of insulin ([place])   2. Coronary artery disease of native artery of native heart with stable angina pectoris ([place])   3. Dyslipidemia    PLAN:    In order of problems listed above:  Coronary disease minimal abnormal stress test showing mild defect involving basal portion of inferior wall.  He is on antiplatelet therapy, he is also on ranolazine with great response.  He does not have any chest pain anymore.  He is fairly active in terms of evaluation before he is potential urological procedure history no objection since he is level of exercises more than 4 METS easily. Type 2 diabetes followed by antimedicine team.  His hemoglobin A1c was 5.7 in April 29, 2020 Dyslipidemia I did review his K PN which show me his LDL not available, HDL 40.  We will call primary care physician to get full profile. Cardiovascular preop evaluation before potential urological procedure.  His ability to exercise is more than 4 METS.  He does not have any chest pain in spite of the fact that minimally abnormal stress test.  Therefore, I think it would be safe for him to proceed with surgery as scheduled.  I will maintain him on antianginal medication which include amlodipine as well as ranolazine.   Medication Adjustments/Labs and Tests Ordered: Current medicines are reviewed at length with the patient today.  Concerns regarding medicines are outlined above.  No orders of the defined types were placed  in this encounter.  Medication changes: No orders of the defined types were placed in this encounter.   Signed, Park Liter, MD, [facility] 08/07/2020 10:59 AM    [place]

## 2020-08-14 ENCOUNTER — Telehealth: Payer: Self-pay | Admitting: Cardiology

## 2020-08-14 ENCOUNTER — Other Ambulatory Visit: Payer: Self-pay | Admitting: Urology

## 2020-08-14 ENCOUNTER — Other Ambulatory Visit: Payer: Self-pay

## 2020-08-14 ENCOUNTER — Encounter (HOSPITAL_COMMUNITY): Payer: Self-pay | Admitting: Urology

## 2020-08-14 DIAGNOSIS — R310 Gross hematuria: Secondary | ICD-10-CM | POA: Diagnosis not present

## 2020-08-14 DIAGNOSIS — C678 Malignant neoplasm of overlapping sites of bladder: Secondary | ICD-10-CM | POA: Diagnosis not present

## 2020-08-14 NOTE — Progress Notes (Signed)
COVID Vaccine Completed: No Date COVID Vaccine completed: N/A Has received booster: N/A COVID vaccine manufacturer: N/A Date of COVID positive in last 90 days: No  PCP - Emmaline Kluver, MD, [facility], [place] Cardiologist - Park Liter, MD last office visit note and cardiac clearance 08/07/2020 in epic  Chest x-ray - greater than 1 year in epic EKG - 05/27/2020 in epic Stress Test - 11/27/2019 in epic ECHO - 11/27/2019 in epic Cardiac Cath - N/A Pacemaker/ICD device last checked: N/A  Sleep Study - Yes CPAP - No  Fasting Blood Sugar - 130-140's Checks Blood Sugar ___1-2__ times every couple of days  Blood Thinner Instructions:N/A Aspirin Instructions:N/A Last Dose:  Activity level:  very active he can walk climb stairs with no difficulties.     Anesthesia review: minimal abnormality involving the basal portion of the inferior wall, DMII, HTN  Patient denies shortness of breath, fever, cough and chest pain at PAT appointment   Patient verbalized understanding of instructions that were given to them at the PAT appointment. Patient was also instructed that they will need to review over the PAT instructions again at home before surgery.

## 2020-08-14 NOTE — Progress Notes (Signed)
Anesthesia Chart Review   Case: [id number] Date/Time: 08/17/20 1150   Procedure: TRANSURETHRAL RESECTION OF BLADDER TUMOR WITH GEMCITABINE CYSTOSCOPY BILATERAL RETROGRADES (Bilateral)   Anesthesia type: General   Pre-op diagnosis: MULTIFOCAL BLADDER TUMOR HEMATURIA   Location: [facility] ROOM 06 / [facility]   Surgeons: Irine Seal, MD       DISCUSSION:70 y.o. former smoker with h/o HTN, OSA, DM II, multifocal bladder tumor scheduled for above procedure 08/17/2020 with Dr. Irine Seal.   Pt seen by cardiology 08/07/2020. Per OV note, "Cardiovascular preop evaluation before potential urological procedure.  His ability to exercise is more than 4 METS.  He does not have any chest pain in spite of the fact that minimally abnormal stress test.  Therefore, I think it would be safe for him to proceed with surgery as scheduled.  I will maintain him on antianginal medication which include amlodipine as well as ranolazine."   VS: Ht '5\' 6"'$  (1.676 m)   Wt 102.1 kg   BMI 36.32 kg/m   PROVIDERS: Street, [name], MD is PCP   Jenne Campus, MD is Caridologist  LABS:  labs DOS, SDW (all labs ordered are listed, but only abnormal results are displayed)  Labs Reviewed - No data to display   IMAGES:   EKG: 05/27/2020 Rate 76 bpm  NSR   CV: Stress Test 11/27/2019 The left ventricular ejection fraction is normal (55-65%). Nuclear stress EF: 64%. There was no ST segment deviation noted during stress. No T wave inversion was noted during stress. Defect 1: There is a small defect of mild severity present in the basal inferior location. Findings consistent with ischemia. This is a low risk study.  Past Medical History:  Diagnosis Date   Abnormal stress test 12/03/2019   Acute pain 12/27/2018   Arthropathy of cervical facet joint 12/14/2016   Bilateral occipital neuralgia 10/13/2016   Body mass index 34.0-34.9, adult 09/02/2013   BPPV (benign paroxysmal positional vertigo)    Cervical  pseudoarthrosis, sequela 02/07/2017   Cervical spinal stenosis 10/13/2016   Chronic pain syndrome [id number]   Diastolic dysfunction 0000000   Duodenal ulcer    Non-bleeding on EGD, March 2021   Dyslipidemia 11/18/2019   Encounter for therapeutic drug monitoring 10/13/2016   Essential hypertension 11/18/2019   GAD (generalized anxiety disorder) 04/06/2015   Gastroesophageal reflux disease without esophagitis    Hiatal hernia    High cholesterol    History of COVID-19 06/2019   History of kidney stones    Hypertension    Kidney stone 11/07/2019   Mild episode of recurrent major depressive disorder ([place]) 04/06/2015   OSA (obstructive sleep apnea)    Postlaminectomy syndrome of cervical region 10/13/2016   Precordial chest pain 11/18/2019   Presence of right artificial knee joint 10/13/2016   Primary localized osteoarthrosis, lower leg 10/13/2016   Trochanteric bursitis of right hip 10/13/2016   Type 2 diabetes mellitus with complication, without long-term current use of insulin ([place]) 11/18/2019   Type 2 diabetes mellitus with hyperglycemia, without long-term current use of insulin ([place])    Unilateral primary osteoarthritis, right knee 10/13/2016   Vitamin D deficiency     Past Surgical History:  Procedure Laterality Date   CERVICAL FUSION     C3-C4 1992 and C5-C6 2001.   COLONOSCOPY     CYSTOSCOPY/URETEROSCOPY/HOLMIUM LASER/STENT PLACEMENT Left 11/22/2018   Procedure: CYSTOSCOPY LEFT URETEROSCOPY/HOLMIUM LASER/STENT EXCHANGE;  Surgeon: Irine Seal, MD;  Location: [facility];  Service: Urology;  Laterality: Left;  CYSTOSCOPY/URETEROSCOPY/HOLMIUM LASER/STENT PLACEMENT Left 12/25/2018   Procedure: CYSTOSCOPY LEFT RETROGRADE PYELOGRAM LEFT URETEROSCOPY WITH HOLMIUM LASER LEFT STENT EXCHANGE;  Surgeon: Irine Seal, MD;  Location: [facility];  Service: Urology;  Laterality: Left;   KNEE ARTHROPLASTY Right    KNEE ARTHROSCOPY Bilateral 1985    LITHOTRIPSY     TONSILLECTOMY     UPPER GI ENDOSCOPY      MEDICATIONS: No current facility-administered medications for this encounter.    acetaminophen (TYLENOL) 325 MG tablet   amLODipine (NORVASC) 2.5 MG tablet   atorvastatin (LIPITOR) 20 MG tablet   buprenorphine (SUBUTEX) 2 MG SUBL SL tablet   glipiZIDE (GLUCOTROL XL) 10 MG 24 hr tablet   metFORMIN (GLUCOPHAGE) 1000 MG tablet   nitroGLYCERIN (NITROSTAT) 0.4 MG SL tablet   olmesartan (BENICAR) 40 MG tablet   omeprazole (PRILOSEC) 40 MG capsule   ranolazine (RANEXA) 500 MG 12 hr tablet   traMADol (ULTRAM) 50 MG tablet   aspirin EC 81 MG tablet    Konrad Felix, PA-C [facility] Pre-Surgical Testing [phone]

## 2020-08-14 NOTE — Telephone Encounter (Signed)
   [facility] Pre-operative Risk Assessment    Patient Name: Frank Ware  DOB: 1950/01/16 MRN: [id number]  HEARTCARE STAFF:  - IMPORTANT!!!!!! Under Visit Info/Reason for Call, type in Other and utilize the format Clearance MM/DD/YY or Clearance TBD. Do not use dashes or single digits. - Please review there is not already an duplicate clearance open for this procedure. - If request is for dental extraction, please clarify the # of teeth to be extracted. - If the patient is currently at the dentist's office, call Pre-Op Callback Staff (MA/nurse) to input urgent request.  - If the patient is not currently in the dentist office, please route to the Pre-Op pool.  Request for surgical clearance:  What type of surgery is being performed? Bladder Tumor Removal   When is this surgery scheduled? 08/17/20  What type of clearance is required (medical clearance vs. Pharmacy clearance to hold med vs. Both)? Both  Are there any medications that need to be held prior to surgery and how long? Asprin - 3 days prior  Practice name and name of physician performing surgery? Dr. Jeffie Pollock  [facility]   What is the office phone number? [phone] ext.[phone]   7.   What is the office fax number? [phone]  8.   Anesthesia type (None, local, MAC, general) ? General    Kamira J Martinique 08/14/2020, 2:23 PM  _________________________________________________________________   (provider comments below)

## 2020-08-16 MED ORDER — GEMCITABINE CHEMO FOR BLADDER INSTILLATION 2000 MG: 2000.0000 mg | Freq: Once | INTRAVENOUS | Status: AC

## 2020-08-16 NOTE — H&P (Signed)
I have bladder cancer.  HPI: Frank Ware is a 70 year-old male established patient who is here for bladder cancer.    Deluca had a recent CT stone study for a 2-3 month history of hematuria and his history of stones at [facility]. He was found to have a possible bladder mass and a pelvic MRI was done which demonstated 3 polypoid bladder lesions consistent with neoplasm with the largest being 2.7cm on the left posterior lateral wall. There is a 2.2cm right lateral wall lesion and a 1.3cm anterior wall lesion. We did ureteroscopy in late 2020 and there was no evidence of bladder lesions and he had a CT AP with/without with his GI in 3/21 and no bladder wall lesions were noted. He is still having hematuria daily. His IPSS is 6 with nocturia x 3. He had some left renal stones on the CT. He is a diabetic and was on Actos in the past. he stopped that recently.      ALLERGIES: None   MEDICATIONS: Metformin Hcl 1,000 mg tablet  Amlodipine Besylate 2.5 mg tablet  Aspirin Ec 81 mg tablet, delayed release  Atorvastatin Calcium 20 mg tablet  Buprenorphine Hcl PRN  Glipizide 10 mg tablet  Meclizine Hcl 25 mg tablet  Nitroglycerin 0.4 mg tablet, sublingual  Olmesartan Medoxomil 40 mg tablet  Ondansetron Hcl 8 mg tablet  Ranolazine Er 500 mg tablet, extended release 12 hr  Tramadol Hcl 50 mg tablet     GU PSH: Cystoscopy Insert Stent, Left - about 09/06/2018 ESWL - 09/27/2018, Left - 09/14/2018 Ureteroscopic laser litho, Left - 12/25/2018, Left - 11/22/2018       PSH Notes: cervical fusion     NON-GU PSH: Knee replacement, Right     GU PMH: Microscopic hematuria, He has microhematuria which is not unexpected with the stone fragments and history of multiple stone procedures. - 2021 Nocturia, he has increased nocturia since the stent was removed. His PVR is 186m. I am going to give him a script for tamsulosin and reviewed the side effects. He was given Toviaz '4mg'$  samples to try if the tamsulosin  doesn't help. He will return in 4-6 weeks. - 2021 Gross hematuria (Improving) - 12/03/2018, Urine culture sent. Hematuria at this point is likely related to the stent. I will treat if the culture is positive. , - 11/15/2018 Renal calculus, Left, His stone is well fragmented in the LLP and I don't think further ESWL is indicated. I discussed ureteroscopy vs PCNL and since he has the stent in place, ureteroscopy would be my recommendation and he is agreeable to that. I have reviewed the risks of ureteroscopy including bleeding, infection, ureteral injury, need for a stent or secondary procedures, thrombotic events and anesthetic complications. - 11/15/2018    NON-GU PMH: Diabetes Type 2 Hypercholesterolemia Hypertension    FAMILY HISTORY: Kidney Stones - Mother   SOCIAL HISTORY: Marital Status: Married Preferred Language: English; Race: White Current Smoking Status: Patient has never smoked.   Tobacco Use Assessment Completed: Used Tobacco in last 30 days? Patient's occupation is/was Retired from [employer].     Notes: 3 sons, 2 daughters   REVIEW OF SYSTEMS:    GU Review Male:   gross hematuria. Patient reports get up at night to urinate. Patient denies frequent urination, hard to postpone urination, burning/ pain with urination, leakage of urine, stream starts and stops, trouble starting your stream, have to strain to urinate , erection problems, and penile pain.  Gastrointestinal (Upper):  Patient denies nausea, vomiting, and indigestion/ heartburn.  Gastrointestinal (Lower):   Patient denies diarrhea and constipation.  Constitutional:   Patient denies night sweats, fever, weight loss, and fatigue.  Skin:   Patient denies skin rash/ lesion and itching.  Eyes:   Patient denies blurred vision and double vision.  Ears/ Nose/ Throat:   Patient denies sore throat and sinus problems.  Hematologic/Lymphatic:   Patient denies swollen glands and easy bruising.  Cardiovascular:   Patient denies  leg swelling and chest pains.  Respiratory:   Patient denies cough and shortness of breath.  Endocrine:   Patient denies excessive thirst.  Musculoskeletal:   Patient denies back pain and joint pain.  Neurological:   Patient denies headaches and dizziness.  Psychologic:   Patient denies depression and anxiety.   VITAL SIGNS: None   MULTI-SYSTEM PHYSICAL EXAMINATION:    Constitutional: Well-nourished. No physical deformities. Normally developed. Good grooming.  Respiratory: Normal breath sounds. No labored breathing, no use of accessory muscles.   Cardiovascular: Regular rate and rhythm. No murmur, no gallop.   Lymphatic: No enlargement, no tenderness of supraclavicular, neck lymph nodes.  Skin: No paleness, no jaundice, no cyanosis. No lesion, no ulcer, no rash.  Neurologic / Psychiatric: Oriented to time, oriented to place, oriented to person. No depression, no anxiety, no agitation.  Gastrointestinal: No mass, no tenderness, no rigidity, non obese abdomen.  Musculoskeletal: Normal gait and station of head and neck.     Complexity of Data:  Records Review:   Previous Patient Records  Urine Test Review:   Urinalysis  X-Ray Review: C.T. Abdomen/Pelvis: Reviewed Films. Reviewed Report. Discussed With Patient. CT from 3/21 reviewed.  C.T. Stone Protocol: Reviewed Films. Reviewed Report. Discussed With Patient. CT from [place] reviewed.  MRI Pelvis: Reviewed Films. Reviewed Report. Discussed With Patient.     PROCEDURES:          Urinalysis w/Scope Dipstick Dipstick Cont'd Micro  Color: Brown Bilirubin: Neg mg/dL WBC/hpf: 6 - 10/hpf  Appearance: Cloudy Ketones: Neg mg/dL RBC/hpf: >60/hpf  Specific Gravity: 1.030 Blood: 3+ ery/uL Bacteria: Mod (26-50/hpf)  pH: <=5.0 Protein: 3+ mg/dL Cystals: NS (Not Seen)  Glucose: Neg mg/dL Urobilinogen: 1.0 mg/dL Casts: NS (Not Seen)    Nitrites: Neg Trichomonas: Not Present    Leukocyte Esterase: 1+ leu/uL Mucous: Present      Epithelial Cells:  NS (Not Seen)      Yeast: NS (Not Seen)      Sperm: Not Present    Notes: unspun micro    ASSESSMENT:      ICD-10 Details  1 GU:   Bladder Cancer overlapping sites - C67.8 Acute, Threat to Bodily Function - He has hematuria that appears to be secondary to multifocal bladder neoplasms. He had previously been on Actos which is a possible cause. I am going to get him set up for cystoscopy with bil RTG's with TURBT and instillation of Gemcitabine. I have reviewed the risks of bleeding, infection, bladder injury, ureteral injury, urethral injury with strictures, need for secondary procedures, chemical cystitis, thrombotic events and anesthetic complications. I have asked him to stop his ASA.   2   Gross hematuria - R31.0 Chronic, Worsening   PLAN:           Orders Labs Urine Culture          Schedule Return Visit/Planned Activity: ASAP - Schedule Surgery             Note: TURBT  Procedure: Unspecified Date -  Cystoscopy TURBT 2-5 cm - 52235 Notes: asap

## 2020-08-17 ENCOUNTER — Encounter (HOSPITAL_COMMUNITY)
Admission: RE | Disposition: A | Discharge status: Home / Self Care | Payer: Self-pay | Source: Home / Self Care | Attending: Urology

## 2020-08-17 ENCOUNTER — Other Ambulatory Visit: Payer: Self-pay

## 2020-08-17 ENCOUNTER — Ambulatory Visit (HOSPITAL_COMMUNITY): Payer: Medicare Other | Admitting: Physician Assistant

## 2020-08-17 ENCOUNTER — Observation Stay (HOSPITAL_COMMUNITY)
Admission: RE | Admit: 2020-08-17 | Discharge: 2020-08-18 | Disposition: A | Discharge status: Home / Self Care | Payer: Medicare Other | Attending: Urology | Admitting: Urology

## 2020-08-17 ENCOUNTER — Encounter (HOSPITAL_COMMUNITY): Payer: Self-pay | Admitting: Urology

## 2020-08-17 ENCOUNTER — Ambulatory Visit (HOSPITAL_COMMUNITY): Payer: Medicare Other

## 2020-08-17 DIAGNOSIS — Z7984 Long term (current) use of oral hypoglycemic drugs: Secondary | ICD-10-CM | POA: Insufficient documentation

## 2020-08-17 DIAGNOSIS — R319 Hematuria, unspecified: Secondary | ICD-10-CM | POA: Diagnosis not present

## 2020-08-17 DIAGNOSIS — Z7982 Long term (current) use of aspirin: Secondary | ICD-10-CM | POA: Diagnosis not present

## 2020-08-17 DIAGNOSIS — I2510 Atherosclerotic heart disease of native coronary artery without angina pectoris: Secondary | ICD-10-CM | POA: Diagnosis not present

## 2020-08-17 DIAGNOSIS — N2889 Other specified disorders of kidney and ureter: Secondary | ICD-10-CM | POA: Diagnosis not present

## 2020-08-17 DIAGNOSIS — C670 Malignant neoplasm of trigone of bladder: Secondary | ICD-10-CM | POA: Diagnosis not present

## 2020-08-17 DIAGNOSIS — I10 Essential (primary) hypertension: Secondary | ICD-10-CM | POA: Insufficient documentation

## 2020-08-17 DIAGNOSIS — Z8616 Personal history of COVID-19: Secondary | ICD-10-CM | POA: Diagnosis not present

## 2020-08-17 DIAGNOSIS — C672 Malignant neoplasm of lateral wall of bladder: Secondary | ICD-10-CM | POA: Insufficient documentation

## 2020-08-17 DIAGNOSIS — C679 Malignant neoplasm of bladder, unspecified: Secondary | ICD-10-CM | POA: Diagnosis present

## 2020-08-17 DIAGNOSIS — C674 Malignant neoplasm of posterior wall of bladder: Secondary | ICD-10-CM | POA: Diagnosis not present

## 2020-08-17 DIAGNOSIS — C678 Malignant neoplasm of overlapping sites of bladder: Secondary | ICD-10-CM

## 2020-08-17 DIAGNOSIS — N200 Calculus of kidney: Secondary | ICD-10-CM | POA: Diagnosis not present

## 2020-08-17 DIAGNOSIS — C671 Malignant neoplasm of dome of bladder: Secondary | ICD-10-CM | POA: Insufficient documentation

## 2020-08-17 DIAGNOSIS — E119 Type 2 diabetes mellitus without complications: Secondary | ICD-10-CM | POA: Diagnosis not present

## 2020-08-17 DIAGNOSIS — D4112 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: Secondary | ICD-10-CM

## 2020-08-17 DIAGNOSIS — E7800 Pure hypercholesterolemia, unspecified: Secondary | ICD-10-CM | POA: Diagnosis not present

## 2020-08-17 DIAGNOSIS — Z96651 Presence of right artificial knee joint: Secondary | ICD-10-CM | POA: Insufficient documentation

## 2020-08-17 DIAGNOSIS — Z79899 Other long term (current) drug therapy: Secondary | ICD-10-CM | POA: Diagnosis not present

## 2020-08-17 DIAGNOSIS — D494 Neoplasm of unspecified behavior of bladder: Secondary | ICD-10-CM | POA: Diagnosis not present

## 2020-08-17 HISTORY — DX: Malignant neoplasm of bladder, unspecified: C67.9

## 2020-08-17 HISTORY — PX: TRANSURETHRAL RESECTION OF BLADDER TUMOR WITH MITOMYCIN-C: SHX6459

## 2020-08-17 LAB — CBC
HCT: 39.8 % (ref 39.0–52.0)
Hemoglobin: 13.4 g/dL (ref 13.0–17.0)
MCH: 33.5 pg (ref 26.0–34.0)
MCHC: 33.7 g/dL (ref 30.0–36.0)
MCV: 99.5 fL (ref 80.0–100.0)
Platelets: 202 10*3/uL (ref 150–400)
RBC: 4 MIL/uL — ABNORMAL LOW (ref 4.22–5.81)
RDW: 13 % (ref 11.5–15.5)
WBC: 5.4 10*3/uL (ref 4.0–10.5)
nRBC: 0 % (ref 0.0–0.2)

## 2020-08-17 LAB — GLUCOSE, CAPILLARY
Glucose-Capillary: 161 mg/dL — ABNORMAL HIGH (ref 70–99)
Glucose-Capillary: 185 mg/dL — ABNORMAL HIGH (ref 70–99)
Glucose-Capillary: 219 mg/dL — ABNORMAL HIGH (ref 70–99)
Glucose-Capillary: 248 mg/dL — ABNORMAL HIGH (ref 70–99)

## 2020-08-17 LAB — HEMOGLOBIN A1C
Hgb A1c MFr Bld: 6.2 % — ABNORMAL HIGH (ref 4.8–5.6)
Mean Plasma Glucose: 131.24 mg/dL

## 2020-08-17 LAB — BASIC METABOLIC PANEL
Anion gap: 9 (ref 5–15)
BUN: 33 mg/dL — ABNORMAL HIGH (ref 8–23)
CO2: 24 mmol/L (ref 22–32)
Calcium: 9.8 mg/dL (ref 8.9–10.3)
Chloride: 105 mmol/L (ref 98–111)
Creatinine, Ser: 1.23 mg/dL (ref 0.61–1.24)
GFR, Estimated: >60 mL/min (ref >60)
Glucose, Bld: 161 mg/dL — ABNORMAL HIGH (ref 70–99)
Potassium: 4.8 mmol/L (ref 3.5–5.1)
Sodium: 138 mmol/L (ref 135–145)

## 2020-08-17 LAB — HIV ANTIBODY (ROUTINE TESTING W REFLEX): HIV Screen 4th Generation wRfx: NONREACTIVE

## 2020-08-17 SURGERY — TRANSURETHRAL RESECTION OF BLADDER TUMOR WITH MITOMYCIN-C
Anesthesia: General | Laterality: Bilateral

## 2020-08-17 MED ORDER — OXYCODONE HCL 5 MG PO TABS
ORAL_TABLET | ORAL | Status: AC
  Administered 2020-08-17: 10 mg via ORAL
  Filled 2020-08-17: qty 2

## 2020-08-17 MED ORDER — ONDANSETRON HCL 4 MG/2ML IJ SOLN: 4.0000 mg | Freq: Once | INTRAMUSCULAR | Status: DC | PRN

## 2020-08-17 MED ORDER — ATORVASTATIN CALCIUM 20 MG PO TABS
20.0000 mg | ORAL_TABLET | Freq: Every day | ORAL | Status: DC
  Administered 2020-08-18: 20 mg via ORAL
  Filled 2020-08-17 (×2): qty 1

## 2020-08-17 MED ORDER — ROCURONIUM BROMIDE 10 MG/ML (PF) SYRINGE
PREFILLED_SYRINGE | INTRAVENOUS | Status: AC
  Filled 2020-08-17: qty 10

## 2020-08-17 MED ORDER — MIDAZOLAM HCL 2 MG/2ML IJ SOLN
INTRAMUSCULAR | Status: AC
  Filled 2020-08-17: qty 2

## 2020-08-17 MED ORDER — ACETAMINOPHEN 325 MG PO TABS: 650.0000 mg | ORAL_TABLET | ORAL | Status: DC | PRN

## 2020-08-17 MED ORDER — DIPHENHYDRAMINE HCL 12.5 MG/5ML PO ELIX: 12.5000 mg | ORAL_SOLUTION | Freq: Four times a day (QID) | ORAL | Status: DC | PRN

## 2020-08-17 MED ORDER — FENTANYL CITRATE (PF) 100 MCG/2ML IJ SOLN
25.0000 ug | INTRAMUSCULAR | Status: DC | PRN
  Administered 2020-08-17: 50 ug via INTRAVENOUS

## 2020-08-17 MED ORDER — PHENYLEPHRINE 40 MCG/ML (10ML) SYRINGE FOR IV PUSH (FOR BLOOD PRESSURE SUPPORT)
PREFILLED_SYRINGE | INTRAVENOUS | Status: AC
  Filled 2020-08-17: qty 20

## 2020-08-17 MED ORDER — ONDANSETRON HCL 4 MG/2ML IJ SOLN: 4.0000 mg | INTRAMUSCULAR | Status: DC | PRN

## 2020-08-17 MED ORDER — SODIUM CHLORIDE 0.9 % IR SOLN
Status: DC | PRN
  Administered 2020-08-17 (×4): 3000 mL
  Administered 2020-08-17: 1000 mL
  Administered 2020-08-17 (×2): 3000 mL

## 2020-08-17 MED ORDER — TRAMADOL HCL 50 MG PO TABS
50.0000 mg | ORAL_TABLET | Freq: Four times a day (QID) | ORAL | Status: DC | PRN
  Administered 2020-08-17 – 2020-08-18 (×3): 50 mg via ORAL
  Filled 2020-08-17 (×3): qty 1

## 2020-08-17 MED ORDER — LIDOCAINE 2% (20 MG/ML) 5 ML SYRINGE
INTRAMUSCULAR | Status: DC | PRN
  Administered 2020-08-17: 100 mg via INTRAVENOUS

## 2020-08-17 MED ORDER — OXYBUTYNIN CHLORIDE 5 MG PO TABS
5.0000 mg | ORAL_TABLET | Freq: Three times a day (TID) | ORAL | Status: DC | PRN
  Administered 2020-08-17 – 2020-08-18 (×2): 5 mg via ORAL
  Filled 2020-08-17 (×2): qty 1

## 2020-08-17 MED ORDER — OXYCODONE HCL 5 MG PO TABS: 10.0000 mg | ORAL_TABLET | Freq: Once | ORAL | Status: AC

## 2020-08-17 MED ORDER — CHLORHEXIDINE GLUCONATE 0.12 % MT SOLN
15.0000 mL | Freq: Once | OROMUCOSAL | Status: AC
  Administered 2020-08-17: 15 mL via OROMUCOSAL

## 2020-08-17 MED ORDER — DEXAMETHASONE SODIUM PHOSPHATE 10 MG/ML IJ SOLN
INTRAMUSCULAR | Status: AC
  Filled 2020-08-17: qty 2

## 2020-08-17 MED ORDER — FLEET ENEMA 7-19 GM/118ML RE ENEM: 1.0000 | ENEMA | Freq: Once | RECTAL | Status: DC | PRN

## 2020-08-17 MED ORDER — SENNOSIDES-DOCUSATE SODIUM 8.6-50 MG PO TABS: 1.0000 | ORAL_TABLET | Freq: Every evening | ORAL | Status: DC | PRN

## 2020-08-17 MED ORDER — ACETAMINOPHEN 500 MG PO TABS: 1000.0000 mg | ORAL_TABLET | Freq: Once | ORAL | Status: DC

## 2020-08-17 MED ORDER — HYDROMORPHONE HCL 1 MG/ML IJ SOLN
0.5000 mg | INTRAMUSCULAR | Status: AC | PRN
  Administered 2020-08-17 (×3): 0.5 mg via INTRAVENOUS

## 2020-08-17 MED ORDER — LACTATED RINGERS IV SOLN: INTRAVENOUS | Status: DC

## 2020-08-17 MED ORDER — ONDANSETRON HCL 4 MG/2ML IJ SOLN
INTRAMUSCULAR | Status: DC | PRN
  Administered 2020-08-17: 4 mg via INTRAVENOUS

## 2020-08-17 MED ORDER — INSULIN ASPART 100 UNIT/ML IJ SOLN
0.0000 [IU] | Freq: Three times a day (TID) | INTRAMUSCULAR | Status: DC
  Administered 2020-08-17 – 2020-08-18 (×2): 5 [IU] via SUBCUTANEOUS
  Administered 2020-08-18: 3 [IU] via SUBCUTANEOUS

## 2020-08-17 MED ORDER — FENTANYL CITRATE (PF) 100 MCG/2ML IJ SOLN
INTRAMUSCULAR | Status: AC
  Filled 2020-08-17: qty 2

## 2020-08-17 MED ORDER — FENTANYL CITRATE (PF) 100 MCG/2ML IJ SOLN
INTRAMUSCULAR | Status: AC
  Administered 2020-08-17: 50 ug via INTRAVENOUS
  Filled 2020-08-17: qty 4

## 2020-08-17 MED ORDER — SUGAMMADEX SODIUM 200 MG/2ML IV SOLN
INTRAVENOUS | Status: DC | PRN
  Administered 2020-08-17: 300 mg via INTRAVENOUS

## 2020-08-17 MED ORDER — DEXAMETHASONE SODIUM PHOSPHATE 10 MG/ML IJ SOLN
INTRAMUSCULAR | Status: DC | PRN
  Administered 2020-08-17: 8 mg via INTRAVENOUS

## 2020-08-17 MED ORDER — PROPOFOL 10 MG/ML IV BOLUS
INTRAVENOUS | Status: DC | PRN
  Administered 2020-08-17: 150 mg via INTRAVENOUS

## 2020-08-17 MED ORDER — DEXMEDETOMIDINE (PRECEDEX) IN NS 20 MCG/5ML (4 MCG/ML) IV SYRINGE
PREFILLED_SYRINGE | INTRAVENOUS | Status: AC
  Filled 2020-08-17: qty 5

## 2020-08-17 MED ORDER — ONDANSETRON HCL 4 MG/2ML IJ SOLN
INTRAMUSCULAR | Status: AC
  Filled 2020-08-17: qty 2

## 2020-08-17 MED ORDER — SUGAMMADEX SODIUM 500 MG/5ML IV SOLN
INTRAVENOUS | Status: AC
  Filled 2020-08-17: qty 5

## 2020-08-17 MED ORDER — DIPHENHYDRAMINE HCL 50 MG/ML IJ SOLN: 12.5000 mg | Freq: Four times a day (QID) | INTRAMUSCULAR | Status: DC | PRN

## 2020-08-17 MED ORDER — NITROGLYCERIN 0.4 MG SL SUBL: 0.4000 mg | SUBLINGUAL_TABLET | SUBLINGUAL | Status: DC | PRN

## 2020-08-17 MED ORDER — FENTANYL CITRATE (PF) 100 MCG/2ML IJ SOLN
INTRAMUSCULAR | Status: DC | PRN
  Administered 2020-08-17: 25 ug via INTRAVENOUS
  Administered 2020-08-17 (×2): 50 ug via INTRAVENOUS
  Administered 2020-08-17: 25 ug via INTRAVENOUS
  Administered 2020-08-17 (×3): 50 ug via INTRAVENOUS

## 2020-08-17 MED ORDER — AMLODIPINE BESYLATE 2.5 MG PO TABS
2.5000 mg | ORAL_TABLET | Freq: Every day | ORAL | Status: DC
  Administered 2020-08-18: 2.5 mg via ORAL
  Filled 2020-08-17: qty 1

## 2020-08-17 MED ORDER — BISACODYL 10 MG RE SUPP: 10.0000 mg | Freq: Every day | RECTAL | Status: DC | PRN

## 2020-08-17 MED ORDER — LIDOCAINE 2% (20 MG/ML) 5 ML SYRINGE
INTRAMUSCULAR | Status: AC
  Filled 2020-08-17: qty 5

## 2020-08-17 MED ORDER — HYDROMORPHONE HCL 1 MG/ML IJ SOLN
INTRAMUSCULAR | Status: AC
  Administered 2020-08-17: 0.5 mg via INTRAVENOUS
  Filled 2020-08-17: qty 2

## 2020-08-17 MED ORDER — ORAL CARE MOUTH RINSE: 15.0000 mL | Freq: Once | OROMUCOSAL | Status: AC

## 2020-08-17 MED ORDER — EPHEDRINE 5 MG/ML INJ
INTRAVENOUS | Status: AC
  Filled 2020-08-17: qty 10

## 2020-08-17 MED ORDER — ROCURONIUM BROMIDE 10 MG/ML (PF) SYRINGE
PREFILLED_SYRINGE | INTRAVENOUS | Status: DC | PRN
  Administered 2020-08-17: 70 mg via INTRAVENOUS
  Administered 2020-08-17 (×2): 10 mg via INTRAVENOUS

## 2020-08-17 MED ORDER — CEFAZOLIN SODIUM-DEXTROSE 2-4 GM/100ML-% IV SOLN
2.0000 g | INTRAVENOUS | Status: AC
  Administered 2020-08-17: 2 g via INTRAVENOUS
  Filled 2020-08-17: qty 100

## 2020-08-17 MED ORDER — IRBESARTAN 300 MG PO TABS
300.0000 mg | ORAL_TABLET | Freq: Every day | ORAL | Status: DC
  Administered 2020-08-17 – 2020-08-18 (×2): 300 mg via ORAL
  Filled 2020-08-17 (×2): qty 1

## 2020-08-17 MED ORDER — DIPHENHYDRAMINE HCL 50 MG/ML IJ SOLN
INTRAMUSCULAR | Status: AC
  Filled 2020-08-17: qty 1

## 2020-08-17 MED ORDER — MIDAZOLAM HCL 5 MG/5ML IJ SOLN
INTRAMUSCULAR | Status: DC | PRN
  Administered 2020-08-17: 2 mg via INTRAVENOUS

## 2020-08-17 MED ORDER — RANOLAZINE ER 500 MG PO TB12
500.0000 mg | ORAL_TABLET | Freq: Two times a day (BID) | ORAL | Status: DC
  Administered 2020-08-17 – 2020-08-18 (×2): 500 mg via ORAL
  Filled 2020-08-17 (×2): qty 1

## 2020-08-17 MED ORDER — POTASSIUM CHLORIDE IN NACL 20-0.45 MEQ/L-% IV SOLN
INTRAVENOUS | Status: DC
  Filled 2020-08-17 (×2): qty 1000

## 2020-08-17 MED ORDER — PROPOFOL 10 MG/ML IV BOLUS
INTRAVENOUS | Status: AC
  Filled 2020-08-17: qty 20

## 2020-08-17 MED ORDER — ONDANSETRON HCL 4 MG/2ML IJ SOLN
INTRAMUSCULAR | Status: AC
  Filled 2020-08-17: qty 6

## 2020-08-17 MED ORDER — DEXAMETHASONE SODIUM PHOSPHATE 10 MG/ML IJ SOLN
INTRAMUSCULAR | Status: AC
  Filled 2020-08-17: qty 1

## 2020-08-17 MED ORDER — LIDOCAINE 2% (20 MG/ML) 5 ML SYRINGE
INTRAMUSCULAR | Status: AC
  Filled 2020-08-17: qty 10

## 2020-08-17 MED ORDER — BUPRENORPHINE HCL 2 MG SL SUBL
2.0000 mg | SUBLINGUAL_TABLET | Freq: Two times a day (BID) | SUBLINGUAL | Status: DC | PRN
  Administered 2020-08-17 – 2020-08-18 (×2): 2 mg via SUBLINGUAL
  Filled 2020-08-17 (×5): qty 1

## 2020-08-17 MED ORDER — PANTOPRAZOLE SODIUM 40 MG PO TBEC
40.0000 mg | DELAYED_RELEASE_TABLET | Freq: Every day | ORAL | Status: DC
  Administered 2020-08-18: 40 mg via ORAL
  Filled 2020-08-17 (×2): qty 1

## 2020-08-17 MED ORDER — DEXMEDETOMIDINE (PRECEDEX) IN NS 20 MCG/5ML (4 MCG/ML) IV SYRINGE
PREFILLED_SYRINGE | INTRAVENOUS | Status: DC | PRN
  Administered 2020-08-17: 8 ug via INTRAVENOUS

## 2020-08-17 SURGICAL SUPPLY — 23 items
BAG URINE DRAIN 2000ML AR STRL (UROLOGICAL SUPPLIES) ×2 IMPLANT
BAG URO CATCHER STRL LF (MISCELLANEOUS) ×2 IMPLANT
CATH FOLEY 2WAY SLVR  5CC 24FR (CATHETERS) ×1
CATH FOLEY 2WAY SLVR 5CC 24FR (CATHETERS) ×1 IMPLANT
CATH FOLEY 3WAY 30CC 22FR (CATHETERS) IMPLANT
CATH URET 5FR 28IN OPEN ENDED (CATHETERS) ×2 IMPLANT
DRAPE FOOT SWITCH (DRAPES) ×2 IMPLANT
GLOVE SURG POLYISO LF SZ8 (GLOVE) IMPLANT
GOWN STRL REUS W/TWL XL LVL3 (GOWN DISPOSABLE) ×2 IMPLANT
GUIDEWIRE STR DUAL SENSOR (WIRE) ×2 IMPLANT
HOLDER FOLEY CATH W/STRAP (MISCELLANEOUS) IMPLANT
IV NS IRRIG 3000ML ARTHROMATIC (IV SOLUTION) ×12 IMPLANT
KIT TURNOVER KIT A (KITS) ×2 IMPLANT
LOOP CUT BIPOLAR 24F LRG (ELECTROSURGICAL) ×2 IMPLANT
MANIFOLD NEPTUNE II (INSTRUMENTS) ×2 IMPLANT
PACK CYSTO (CUSTOM PROCEDURE TRAY) ×2 IMPLANT
SHEATH URETERAL 12FRX35CM (MISCELLANEOUS) ×2 IMPLANT
SUT ETHILON 3 0 PS 1 (SUTURE) IMPLANT
SYR 30ML LL (SYRINGE) IMPLANT
SYR TOOMEY IRRIG 70ML (MISCELLANEOUS)
SYRINGE TOOMEY IRRIG 70ML (MISCELLANEOUS) IMPLANT
TUBING CONNECTING 10 (TUBING) ×2 IMPLANT
TUBING UROLOGY SET (TUBING) ×2 IMPLANT

## 2020-08-17 NOTE — Interval H&P Note (Signed)
History and Physical Interval Note:  08/17/2020 11:49 AM  Frank Ware  has presented today for surgery, with the diagnosis of MULTIFOCAL BLADDER TUMOR HEMATURIA.  The various methods of treatment have been discussed with the patient and family. After consideration of risks, benefits and other options for treatment, the patient has consented to  Procedure(s): TRANSURETHRAL RESECTION OF BLADDER TUMOR WITH GEMCITABINE CYSTOSCOPY BILATERAL RETROGRADES (Bilateral) as a surgical intervention.  [name] history has been reviewed, patient examined, no change in status, stable for surgery.  I have reviewed the patient's chart and labs.  Questions were answered to the patient's satisfaction.     Irine Seal

## 2020-08-17 NOTE — Op Note (Signed)
Procedure: 1.  Cystoscopy with bilateral retrograde pyelography and interpretation. 2.  Left diagnostic ureteroscopy. 3.  Transurethral resection of bladder tumors from the left lateral wall and trigone, right lateral wall and dome with the largest greater than 5 cm. 4.  Application of fluoroscopy.  Preop diagnosis: Multifocal bladder tumors.  Postoperative diagnosis: Same with left renal collecting system urothelial carcinoma.  Surgeon: Dr. Irine Seal.  Anesthesia: General.  Specimen: Bladder tumor chips.  Drain: 24 [place] Foley catheter.  EBL: 20 mL.  Complications: None.  Indications: Cache is a 70 year old male with a history of stones had previously undergone left ureteroscopy for stones in the lower pole after multiple failed lithotripsies.  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  He was recently seen for pain and had a CT stone study done in outside facility this demonstrated stable stones in the left lower pole without obstruction but there were bladder wall lesions of concern.  He went for a pelvic MRI which demonstrated papillary appearing lesions of the left lateral wall down to the lateral trigone, the dome and the right lateral wall.  The left lateral wall lesion was measured at 2.7 cm on imaging.  Procedure: He was given antibiotics.  He was taken operating room where general anesthetic was induced.  He was placed in lithotomy position and fitted with PAS hose.  His perineum and genitalia were prepped with Betadine solution he was draped in usual sterile fashion.  Cystoscopy was performed using a 23 [place] scope and 30 degree lens.  Examination revealed a normal urethra.  The external sphincter was intact.  The prostatic urethra short without obstruction.  Examination of bladder revealed a large papillary lesion that was right at the lateral edge of the left ureteral orifice extending on the  lateral wall and there were some additional papillary tumors associated with the larger tumor that extended for several centimeters adjacent to the primary tumor.  There is a approximately 2-1/2 cm tumor of the right lateral wall and approximately 2 cm tumor of the dome it also had a primary tumor mass with some surrounding tumor fronds extending for centimeters so beyond the primary tumor fronds.  The right ureteral orifice was unremarkable.  Bilateral retrograde pyelography was performed using a 5 French opening catheter and Omnipaque diluted half-and-half with saline.  The right retrograde pyelogram demonstrated a normal ureter and intrarenal collecting system without filling defects.  The left retrograde pyelogram revealed a normal ureter.  He had Megacalycosis of the left collecting system with filling defects in the lower pole that initially appeared somewhat ratty.  He had stones in that area but it was more concerning for a soft tissue filling defect.  After completion of the left retrograde pyelogram a sensor wire was advanced the kidney under fluoroscopic guidance.  The ureter was dilated with a 35 cm 12 French access sheath inner core but I was unable to place the assembled 12/14 sheath.  A single lumen digital flexible scope was advanced over the wire to the kidney and the wire was removed.  Inspection demonstrated significant papillary fronds in the lower collecting system and in the mid to upper pole collecting system consistent with urothelial carcinoma seen in the bladder.  A few stones were noted as well.  Photographs were obtained.  The ureteroscope was removed but it was not felt that the stent was indicated.  A 26 French continuous-flow resectoscope sheath  was then passed with the aid of the visual obturator and was then fitted with the [facility] handle with a bipolar loop and 30 degree lens.  Saline was used the irrigant.  The largest tumor on the left was initially resected down to  the bladder wall and into the muscle there were some areas that were suggestive of muscle invasion but it was difficult to say for certain.  His bladder wall was generally fairly thin and fat was visualized in a couple of areas.  Once the primary tumor bulk was removed the surrounding tumor fronds were excised for an area of resection of approximately 5 x 6 cm.  I resected closely around the left ureteral orifice but did not clearly resect the orifice.  The right lateral wall tumor was then resected down to the muscular fibers and generously fulgurated.  The tumor on the dome was removed with a combination of resection and fulguration as it was somewhat difficult to access due to angulation and the patient's body habitus but I did feel I was able to destroy the entire tumor.  The bladder was evacuated free of tumor chips and final hemostasis was achieved.  Final inspection revealed no active bleeding.  The right ureteral orifice was intact.  The left ureteral orifice was visible with some adjacent resection but it was not felt the stent was indicated.  A 24 French Foley catheter was inserted and the balloon was filled with 10 mL of sterile fluid.  The catheter return was clear so was placed to straight drainage.  He was taken down from lithotomy position, his anesthetic was reversed and he was moved recovery room in stable condition.  The specimen was sent to pathology for review.

## 2020-08-17 NOTE — Anesthesia Preprocedure Evaluation (Addendum)
Anesthesia Evaluation  Patient identified by MRN, date of birth, ID band Patient awake    Reviewed: Allergy & Precautions, NPO status , Patient's Chart, lab work & pertinent test results  Airway Mallampati: III  TM Distance: >3 FB Neck ROM: Full    Dental  (+) Teeth Intact, Dental Advisory Given   Pulmonary sleep apnea , former smoker,    Pulmonary exam normal breath sounds clear to auscultation       Cardiovascular hypertension, Pt. on medications + CAD  Normal cardiovascular exam Rhythm:Regular Rate:Normal     Neuro/Psych PSYCHIATRIC DISORDERS Anxiety Depression  Neuromuscular disease    GI/Hepatic Neg liver ROS, hiatal hernia, PUD, GERD  Medicated and Controlled,  Endo/Other  diabetes, Type 2, Oral Hypoglycemic AgentsObesity   Renal/GU negative Renal ROS   MULTIFOCAL BLADDER TUMOR HEMATURIA    Musculoskeletal  (+) Arthritis ,   Abdominal   Peds  Hematology negative hematology ROS (+)   Anesthesia Other Findings Day of surgery medications reviewed with the patient.  Reproductive/Obstetrics                            Anesthesia Physical Anesthesia Plan  ASA: 2  Anesthesia Plan: General   Post-op Pain Management:    Induction: Intravenous  PONV Risk Score and Plan: 3 and Midazolam, Dexamethasone and Ondansetron  Airway Management Planned: Oral ETT  Additional Equipment:   Intra-op Plan:   Post-operative Plan: Extubation in OR  Informed Consent: I have reviewed the patients History and Physical, chart, labs and discussed the procedure including the risks, benefits and alternatives for the proposed anesthesia with the patient or authorized representative who has indicated his/her understanding and acceptance.     Dental advisory given  Plan Discussed with: CRNA  Anesthesia Plan Comments:         Anesthesia Quick Evaluation

## 2020-08-17 NOTE — Transfer of Care (Signed)
Immediate Anesthesia Transfer of Care Note  Patient: Frank Ware  Procedure(s) Performed: TRANSURETHRAL RESECTION OF BLADDER TUMOR WITH GEMCITABINE CYSTOSCOPY BILATERAL RETROGRADES (Bilateral)  Patient Location: PACU  Anesthesia Type:General  Level of Consciousness: awake, drowsy and patient cooperative  Airway & Oxygen Therapy: Patient Spontanous Breathing and Patient connected to face mask oxygen  Post-op Assessment: Report given to RN and Post -op Vital signs reviewed and stable  Post vital signs: Reviewed and stable  Last Vitals:  Vitals Value Taken Time  BP 124/103 08/17/20 1342  Temp    Pulse 95 08/17/20 1345  Resp    SpO2 100 % 08/17/20 1345  Vitals shown include unvalidated device data.  Last Pain:  Vitals:   08/17/20 1033  TempSrc:   PainSc: 7       Patients Stated Pain Goal: 3 ([id number])  Complications: No notable events documented.

## 2020-08-17 NOTE — Anesthesia Procedure Notes (Signed)
Procedure Name: Intubation Date/Time: 08/17/2020 12:12 PM Performed by: Niel Hummer, CRNA Pre-anesthesia Checklist: Patient identified, Emergency Drugs available, Suction available and Patient being monitored Patient Re-evaluated:Patient Re-evaluated prior to induction Oxygen Delivery Method: Circle system utilized Preoxygenation: Pre-oxygenation with 100% oxygen Induction Type: IV induction Ventilation: Mask ventilation without difficulty Laryngoscope Size: Mac and 4 Grade View: Grade II Tube type: Oral Tube size: 7.5 mm Number of attempts: 1 Airway Equipment and Method: Stylet Placement Confirmation: ETT inserted through vocal cords under direct vision Secured at: 23 cm Tube secured with: Tape Dental Injury: Teeth and Oropharynx as per pre-operative assessment

## 2020-08-17 NOTE — Plan of Care (Signed)

## 2020-08-18 ENCOUNTER — Encounter (HOSPITAL_COMMUNITY): Payer: Self-pay | Admitting: Urology

## 2020-08-18 DIAGNOSIS — D4112 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: Secondary | ICD-10-CM

## 2020-08-18 DIAGNOSIS — Z7984 Long term (current) use of oral hypoglycemic drugs: Secondary | ICD-10-CM | POA: Diagnosis not present

## 2020-08-18 DIAGNOSIS — C671 Malignant neoplasm of dome of bladder: Secondary | ICD-10-CM | POA: Diagnosis not present

## 2020-08-18 DIAGNOSIS — Z96651 Presence of right artificial knee joint: Secondary | ICD-10-CM | POA: Diagnosis not present

## 2020-08-18 DIAGNOSIS — Z7982 Long term (current) use of aspirin: Secondary | ICD-10-CM | POA: Diagnosis not present

## 2020-08-18 DIAGNOSIS — Z8616 Personal history of COVID-19: Secondary | ICD-10-CM | POA: Diagnosis not present

## 2020-08-18 DIAGNOSIS — I10 Essential (primary) hypertension: Secondary | ICD-10-CM | POA: Diagnosis not present

## 2020-08-18 DIAGNOSIS — E119 Type 2 diabetes mellitus without complications: Secondary | ICD-10-CM | POA: Diagnosis not present

## 2020-08-18 DIAGNOSIS — C670 Malignant neoplasm of trigone of bladder: Secondary | ICD-10-CM | POA: Diagnosis not present

## 2020-08-18 DIAGNOSIS — C674 Malignant neoplasm of posterior wall of bladder: Secondary | ICD-10-CM | POA: Diagnosis not present

## 2020-08-18 DIAGNOSIS — Z79899 Other long term (current) drug therapy: Secondary | ICD-10-CM | POA: Diagnosis not present

## 2020-08-18 DIAGNOSIS — C672 Malignant neoplasm of lateral wall of bladder: Secondary | ICD-10-CM | POA: Diagnosis not present

## 2020-08-18 DIAGNOSIS — C679 Malignant neoplasm of bladder, unspecified: Secondary | ICD-10-CM | POA: Diagnosis not present

## 2020-08-18 HISTORY — DX: 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: D41.12

## 2020-08-18 LAB — BASIC METABOLIC PANEL
Anion gap: 10 (ref 5–15)
BUN: 25 mg/dL — ABNORMAL HIGH (ref 8–23)
CO2: 24 mmol/L (ref 22–32)
Calcium: 9.4 mg/dL (ref 8.9–10.3)
Chloride: 101 mmol/L (ref 98–111)
Creatinine, Ser: 1 mg/dL (ref 0.61–1.24)
GFR, Estimated: >60 mL/min (ref >60)
Glucose, Bld: 189 mg/dL — ABNORMAL HIGH (ref 70–99)
Potassium: 4.8 mmol/L (ref 3.5–5.1)
Sodium: 135 mmol/L (ref 135–145)

## 2020-08-18 LAB — GLUCOSE, CAPILLARY
Glucose-Capillary: 168 mg/dL — ABNORMAL HIGH (ref 70–99)
Glucose-Capillary: 213 mg/dL — ABNORMAL HIGH (ref 70–99)

## 2020-08-18 LAB — SURGICAL PATHOLOGY

## 2020-08-18 MED ORDER — HYDROCODONE-ACETAMINOPHEN 5-325 MG PO TABS: 1.0000 | ORAL_TABLET | ORAL | 0 refills | Status: DC | PRN

## 2020-08-18 NOTE — Progress Notes (Signed)
RN provided and explained discharge instructions to patient and wife. Foley was removed this AM per order and pt voiding fine on his own, producing clear strawberry colored urine. IV removed. Pt advised to f/u with urology office. Pt dressed in personal clothing and taken to the main entrance via wheelchair.

## 2020-08-18 NOTE — Plan of Care (Signed)
  Problem: Coping: Goal: Level of anxiety will decrease Outcome: Progressing   Problem: Elimination: Goal: Will not experience complications related to urinary retention Outcome: Progressing   Problem: Education: Goal: Knowledge of General Education information will improve Description: Including pain rating scale, medication(s)/side effects and non-pharmacologic comfort measures Outcome: Adequate for Discharge   Problem: Health Behavior/Discharge Planning: Goal: Ability to manage health-related needs will improve Outcome: Adequate for Discharge   Problem: Clinical Measurements: Goal: Ability to maintain clinical measurements within normal limits will improve Outcome: Adequate for Discharge   Problem: Activity: Goal: Risk for activity intolerance will decrease Outcome: Adequate for Discharge   Problem: Nutrition: Goal: Adequate nutrition will be maintained Outcome: Adequate for Discharge

## 2020-08-18 NOTE — Anesthesia Postprocedure Evaluation (Signed)
Anesthesia Post Note  Patient: Frank Ware  Procedure(s) Performed: TRANSURETHRAL RESECTION OF BLADDER TUMOR , CYSTOSCOPY BILATERAL RETROGRADES (Bilateral)     Patient location during evaluation: PACU Anesthesia Type: General Level of consciousness: awake and alert Pain management: pain level controlled Vital Signs Assessment: post-procedure vital signs reviewed and stable Respiratory status: spontaneous breathing, nonlabored ventilation, respiratory function stable and patient connected to nasal cannula oxygen Cardiovascular status: blood pressure returned to baseline and stable Postop Assessment: no apparent nausea or vomiting Anesthetic complications: no   No notable events documented.  Last Vitals:  Vitals:   08/17/20 2339 08/18/20 0408  BP: 116/72 116/84  Pulse: 88 86  Resp: 15 14  Temp: 37 C 36.9 C  SpO2: 98% 98%    Last Pain:  Vitals:   08/18/20 0408  TempSrc: Oral  PainSc:                  [name]

## 2020-08-19 NOTE — Discharge Summary (Addendum)
Physician Discharge Summary  Patient ID: Frank Ware MRN: [id number] DOB/AGE: 1950/01/19 70 y.o.  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   Past Medical History:  Diagnosis Date   Abnormal stress test 12/03/2019   Acute pain 12/27/2018   Arthropathy of cervical facet joint 12/14/2016   Bilateral occipital neuralgia 10/13/2016   Body mass index 34.0-34.9, adult 09/02/2013   BPPV (benign paroxysmal positional vertigo)    Cervical pseudoarthrosis, sequela 02/07/2017   Cervical spinal stenosis 10/13/2016   Chronic pain syndrome 99991111   Diastolic dysfunction 0000000   Duodenal ulcer    Non-bleeding on EGD, March 2021   Dyslipidemia 11/18/2019   Encounter for therapeutic drug monitoring 10/13/2016   Essential hypertension 11/18/2019   GAD (generalized anxiety disorder) 04/06/2015   Gastroesophageal reflux disease without esophagitis    Hiatal hernia    High cholesterol    History of COVID-19 06/2019   History of kidney stones    Hypertension    Kidney stone 11/07/2019   Mild episode of recurrent major depressive disorder ([place]) 04/06/2015   OSA (obstructive sleep apnea)    Postlaminectomy syndrome of cervical region 10/13/2016   Precordial chest pain 11/18/2019   Presence of right artificial knee joint 10/13/2016   Primary localized osteoarthrosis, lower leg 10/13/2016   Trochanteric bursitis of right hip 10/13/2016   Type 2 diabetes mellitus with complication, without long-term current use of insulin ([place]) 11/18/2019   Type 2 diabetes mellitus with hyperglycemia, without long-term current use of insulin ([place])    Unilateral primary osteoarthritis, right knee 10/13/2016   Vitamin D deficiency     Surgeries: Procedure(s): TRANSURETHRAL RESECTION OF BLADDER TUMOR , CYSTOSCOPY  BILATERAL RETROGRADES on 08/17/2020   Consultants (if any):   Discharged Condition: Improved  Hospital Course: Frank Ware is an 70 y.o. male who was admitted 08/17/2020 with a diagnosis of Bladder cancer ([place]) and went to the operating room on 08/17/2020 and underwent the above named procedures. He was found to have multifocal bladder cancer and left renal pelvic and calyceal UCa.   His urine was clear on POD#1 so the catheter was removed and he was discharged home.   He was given perioperative antibiotics:  Anti-infectives (From admission, onward)    Start     Dose/Rate Route Frequency Ordered Stop   08/17/20 1018  ceFAZolin (ANCEF) IVPB 2g/100 mL premix        2 g 200 mL/hr over 30 Minutes Intravenous 30 min pre-op 08/17/20 1018 08/17/20 1243     .  He was given sequential compression devices, for DVT prophylaxis.  He benefited maximally from the hospital stay and there were no complications.    Recent vital signs:  Vitals:   08/18/20 0408 08/18/20 1038  BP: 116/84 126/89  Pulse: 86 97  Resp: 14 20  Temp: 98.4 F (36.9 C) 98 F (36.7 C)  SpO2: 98% 96%    Recent laboratory studies:  Lab Results  Component Value Date   HGB 13.4 08/17/2020   HGB 14.1 07/08/2019   HGB 13.2 03/21/2019   Lab Results  Component Value Date   WBC 5.4 08/17/2020   PLT 202 08/17/2020   No results found for: INR Lab Results  Component Value Date   NA 135 08/18/2020   K 4.8 08/18/2020   CL 101 08/18/2020  CO2 24 08/18/2020   BUN 25 (H) 08/18/2020   CREATININE 1.00 08/18/2020   GLUCOSE 189 (H) 08/18/2020    Discharge Medications:   Allergies as of 08/18/2020   No Known Allergies      Medication List     TAKE these medications    acetaminophen 325 MG tablet Commonly known as: TYLENOL Take 650 mg by mouth every 6 (six) hours as needed for mild pain, fever or headache.   amLODipine 2.5 MG tablet Commonly known as: NORVASC Take 2.5 mg by mouth daily.   atorvastatin 20 MG  tablet Commonly known as: LIPITOR Take 20 mg by mouth daily.   buprenorphine 2 MG Subl SL tablet Commonly known as: SUBUTEX Place 2 mg under the tongue every 12 (twelve) hours as needed (pain).   glipiZIDE 10 MG 24 hr tablet Commonly known as: GLUCOTROL XL Take 10 mg by mouth in the morning and at bedtime.   HYDROcodone-acetaminophen 5-325 MG tablet Commonly known as: NORCO/VICODIN Take 1 tablet by mouth every 4 (four) hours as needed for moderate pain.   metFORMIN 1000 MG tablet Commonly known as: GLUCOPHAGE Take 1,000 mg by mouth 2 (two) times daily with a meal.   nitroGLYCERIN 0.4 MG SL tablet Commonly known as: NITROSTAT Place 1 tablet (0.4 mg total) under the tongue every 5 (five) minutes as needed. What changed: reasons to take this   olmesartan 40 MG tablet Commonly known as: BENICAR Take 40 mg by mouth daily.   omeprazole 40 MG capsule Commonly known as: PRILOSEC Take 40 mg by mouth daily.   ranolazine 500 MG 12 hr tablet Commonly known as: RANEXA Take 1 tablet (500 mg total) by mouth 2 (two) times daily.   traMADol 50 MG tablet Commonly known as: ULTRAM Take 50 mg by mouth every 6 (six) hours as needed for moderate pain or severe pain.        Diagnostic Studies: DG C-Arm 1-60 Min-No Report  Result Date: 08/17/2020 Fluoroscopy was utilized by the requesting physician.  No radiographic interpretation.    Disposition: Discharge disposition: 01-Home or Self Care       Discharge Instructions     Discontinue IV   Complete by: As directed         Follow-up Information     Irine Seal, MD Follow up.   Specialty: Urology Why: as scheduled Contact information: [place] [address] [phone]                  Signed: Irine Seal 08/19/2020, 2:22 PM

## 2020-08-22 ENCOUNTER — Encounter: Payer: Self-pay | Admitting: Student

## 2020-08-23 ENCOUNTER — Other Ambulatory Visit: Payer: Self-pay | Admitting: Cardiology

## 2020-08-25 ENCOUNTER — Encounter: Payer: Self-pay | Admitting: Gastroenterology

## 2020-08-28 DIAGNOSIS — Z79899 Other long term (current) drug therapy: Secondary | ICD-10-CM | POA: Diagnosis not present

## 2020-08-28 DIAGNOSIS — M5481 Occipital neuralgia: Secondary | ICD-10-CM | POA: Diagnosis not present

## 2020-08-28 DIAGNOSIS — M47812 Spondylosis without myelopathy or radiculopathy, cervical region: Secondary | ICD-10-CM | POA: Diagnosis not present

## 2020-08-28 DIAGNOSIS — M961 Postlaminectomy syndrome, not elsewhere classified: Secondary | ICD-10-CM | POA: Diagnosis not present

## 2020-08-28 DIAGNOSIS — G894 Chronic pain syndrome: Secondary | ICD-10-CM | POA: Diagnosis not present

## 2020-08-31 DIAGNOSIS — R310 Gross hematuria: Secondary | ICD-10-CM | POA: Diagnosis not present

## 2020-08-31 DIAGNOSIS — C652 Malignant neoplasm of left renal pelvis: Secondary | ICD-10-CM | POA: Diagnosis not present

## 2020-08-31 DIAGNOSIS — C678 Malignant neoplasm of overlapping sites of bladder: Secondary | ICD-10-CM | POA: Diagnosis not present

## 2020-09-07 DIAGNOSIS — C652 Malignant neoplasm of left renal pelvis: Secondary | ICD-10-CM | POA: Diagnosis not present

## 2020-09-07 DIAGNOSIS — C678 Malignant neoplasm of overlapping sites of bladder: Secondary | ICD-10-CM | POA: Diagnosis not present

## 2020-09-09 ENCOUNTER — Other Ambulatory Visit: Payer: Self-pay | Admitting: Urology

## 2020-09-09 DIAGNOSIS — C652 Malignant neoplasm of left renal pelvis: Secondary | ICD-10-CM | POA: Diagnosis not present

## 2020-09-09 DIAGNOSIS — C678 Malignant neoplasm of overlapping sites of bladder: Secondary | ICD-10-CM | POA: Diagnosis not present

## 2020-09-17 NOTE — Progress Notes (Signed)
DUE TO COVID-19 ONLY ONE VISITOR IS ALLOWED TO COME WITH YOU AND STAY IN THE WAITING ROOM ONLY DURING PRE OP AND PROCEDURE DAY OF SURGERY. THE 1 VISITOR  MAY VISIT WITH YOU AFTER SURGERY IN YOUR PRIVATE ROOM DURING VISITING HOURS ONLY!  YOU NEED TO HAVE A COVID 19 TEST ON_______ '@_______'$ , THIS TEST MUST BE DONE BEFORE SURGERY,  COVID TESTING SITE IS AT [place]. PLEASE REMAIN IN YOUR CAR THIS IS A DRIVER UP TEST. AFTER YOUR COVID TEST PLEASE WEAR A MASK OUT IN PUBLIC AND SOCIAL DISTANCE AND [place] YOUR HANDS FREQUENTLY. PLEASE ASK ALL YOUR CLOSE CONTACTS TO WEAR A MASK OUT IN PUBLIC AND SOCIAL DISTANCE AND [place] HANDS FREQUENTLY ALSO.               Frank Ware  09/17/2020   Your procedure is scheduled on:  09/25/2020   Report to [facility] Main  Entrance   Report to admitting at  AM     Call this number if you have problems the morning of surgery [phone]    Remember: Do not eat food , candy gum or mints :After Midnight. You may have clear liquids from midnight until __ 0515am    CLEAR LIQUID DIET   Foods Allowed                                                                       Coffee and tea, regular and decaf                              Plain Jell-O any favor except red or purple                                            Fruit ices (not with fruit pulp)                                      Iced Popsicles                                     Carbonated beverages, regular and diet                                    Cranberry, grape and apple juices Sports drinks like Gatorade Lightly seasoned clear broth or consume(fat free) Sugar   _____________________________________________________________________    BRUSH YOUR TEETH MORNING OF SURGERY AND RINSE YOUR MOUTH OUT, NO CHEWING GUM CANDY OR MINTS.     Take these medicines the morning of surgery with A SIP OF WATER:  prilosec, amlodipine, ranexa   DO NOT TAKE ANY DIABETIC MEDICATIONS DAY  OF YOUR SURGERY  You may not have any metal on your body including hair pins and              piercings  Do not wear jewelry, make-up, lotions, powders or perfumes, deodorant             Do not wear nail polish on your fingernails.  Do not shave  48 hours prior to surgery.              Men may shave face and neck.   Do not bring valuables to the hospital. [place].  Contacts, dentures or bridgework may not be worn into surgery.  Leave suitcase in the car. After surgery it may be brought to your room.     Patients discharged the day of surgery will not be allowed to drive home. IF YOU ARE HAVING SURGERY AND GOING HOME THE SAME DAY, YOU MUST HAVE AN ADULT TO DRIVE YOU HOME AND BE WITH YOU FOR 24 HOURS. YOU MAY GO HOME BY TAXI OR UBER OR ORTHERWISE, BUT AN ADULT MUST ACCOMPANY YOU HOME AND STAY WITH YOU FOR 24 HOURS.  Name and phone number of your driver:  Special Instructions: N/A              Please read over the following fact sheets you were given: _____________________________________________________________________  [facility] - Preparing for Surgery Before surgery, you can play an important role.  Because skin is not sterile, your skin needs to be as free of germs as possible.  You can reduce the number of germs on your skin by washing with CHG (chlorahexidine gluconate) soap before surgery.  CHG is an antiseptic cleaner which kills germs and bonds with the skin to continue killing germs even after washing. Please DO NOT use if you have an allergy to CHG or antibacterial soaps.  If your skin becomes reddened/irritated stop using the CHG and inform your nurse when you arrive at Short Stay. Do not shave (including legs and underarms) for at least 48 hours prior to the first CHG shower.  You may shave your face/neck. Please follow these instructions carefully:  1.  Shower with CHG Soap the night before surgery  and the  morning of Surgery.  2.  If you choose to wash your hair, wash your hair first as usual with your  normal  shampoo.  3.  After you shampoo, rinse your hair and body thoroughly to remove the  shampoo.                           4.  Use CHG as you would any other liquid soap.  You can apply chg directly  to the skin and wash                       Gently with a scrungie or clean washcloth.  5.  Apply the CHG Soap to your body ONLY FROM THE NECK DOWN.   Do not use on face/ open                           Wound or open sores. Avoid contact with eyes, ears mouth and genitals (private parts).  Wash face,  Genitals (private parts) with your normal soap.             6.  Wash thoroughly, paying special attention to the area where your surgery  will be performed.  7.  Thoroughly rinse your body with warm water from the neck down.  8.  DO NOT shower/wash with your normal soap after using and rinsing off  the CHG Soap.                9.  Pat yourself dry with a clean towel.            10.  Wear clean pajamas.            11.  Place clean sheets on your bed the night of your first shower and do not  sleep with pets. Day of Surgery : Do not apply any lotions/deodorants the morning of surgery.  Please wear clean clothes to the hospital/surgery center.  FAILURE TO FOLLOW THESE INSTRUCTIONS MAY RESULT IN THE CANCELLATION OF YOUR SURGERY PATIENT SIGNATURE_________________________________  NURSE SIGNATURE__________________________________  ________________________________________________________________________

## 2020-09-22 ENCOUNTER — Other Ambulatory Visit: Payer: Self-pay

## 2020-09-22 ENCOUNTER — Encounter (HOSPITAL_COMMUNITY)
Admission: RE | Admit: 2020-09-22 | Discharge: 2020-09-22 | Disposition: A | Discharge status: Home / Self Care | Payer: Medicare Other | Source: Ambulatory Visit | Attending: Urology | Admitting: Urology

## 2020-09-22 ENCOUNTER — Encounter (HOSPITAL_COMMUNITY): Payer: Self-pay

## 2020-09-22 DIAGNOSIS — Z01812 Encounter for preprocedural laboratory examination: Secondary | ICD-10-CM | POA: Diagnosis not present

## 2020-09-22 LAB — CBC
HCT: 41.1 % (ref 39.0–52.0)
Hemoglobin: 13.3 g/dL (ref 13.0–17.0)
MCH: 32.6 pg (ref 26.0–34.0)
MCHC: 32.4 g/dL (ref 30.0–36.0)
MCV: 100.7 fL — ABNORMAL HIGH (ref 80.0–100.0)
Platelets: 207 10*3/uL (ref 150–400)
RBC: 4.08 MIL/uL — ABNORMAL LOW (ref 4.22–5.81)
RDW: 12.6 % (ref 11.5–15.5)
WBC: 6.5 10*3/uL (ref 4.0–10.5)
nRBC: 0 % (ref 0.0–0.2)

## 2020-09-22 LAB — BASIC METABOLIC PANEL
Anion gap: 7 (ref 5–15)
BUN: 22 mg/dL (ref 8–23)
CO2: 25 mmol/L (ref 22–32)
Calcium: 9.6 mg/dL (ref 8.9–10.3)
Chloride: 105 mmol/L (ref 98–111)
Creatinine, Ser: 0.72 mg/dL (ref 0.61–1.24)
GFR, Estimated: >60 mL/min (ref >60)
Glucose, Bld: 128 mg/dL — ABNORMAL HIGH (ref 70–99)
Potassium: 4.9 mmol/L (ref 3.5–5.1)
Sodium: 137 mmol/L (ref 135–145)

## 2020-09-22 LAB — GLUCOSE, CAPILLARY: Glucose-Capillary: 127 mg/dL — ABNORMAL HIGH (ref 70–99)

## 2020-09-22 NOTE — Progress Notes (Addendum)
Anesthesia Review:  PCP: DR [name] - [place]  Cardiologist : DR Posey Rea - [facility] 08/07/20 - clearance in note  Chest x-ray : EKG : 05/27/20  Echo : Stress test: 12/2019  Cardiac Cath :  Activity level: can do a flight of stairs without difficulty  Sleep Study/ CPAP : none  Fasting Blood Sugar :      / Checks Blood Sugar -- times a day:   Blood Thinner/ Instructions /Last Dose: ASA / Instructions/ Last Dose :   DM- type 2  checks every other day per pat  Hgba1c-08/17/20-6.2 No covid test required - ambulatory surgery

## 2020-09-24 MED ORDER — GENTAMICIN SULFATE 40 MG/ML IJ SOLN
5.0000 mg/kg | INTRAVENOUS | Status: AC
  Administered 2020-09-25: 390 mg via INTRAVENOUS
  Filled 2020-09-24: qty 9.75

## 2020-09-24 NOTE — Anesthesia Preprocedure Evaluation (Addendum)
Anesthesia Evaluation  Patient identified by MRN, date of birth, ID band Patient awake    Reviewed: Allergy & Precautions, NPO status , Patient's Chart, lab work & pertinent test results  Airway Mallampati: II  TM Distance: >3 FB Neck ROM: Full    Dental no notable dental hx.    Pulmonary sleep apnea , former smoker,    Pulmonary exam normal breath sounds clear to auscultation       Cardiovascular hypertension, Pt. on medications Normal cardiovascular exam Rhythm:Regular Rate:Normal     Neuro/Psych negative neurological ROS  negative psych ROS   GI/Hepatic Neg liver ROS, GERD  ,  Endo/Other  diabetes, Type 2, Insulin Dependent  Renal/GU negative Renal ROS  negative genitourinary   Musculoskeletal negative musculoskeletal ROS (+)   Abdominal   Peds negative pediatric ROS (+)  Hematology negative hematology ROS (+)   Anesthesia Other Findings   Reproductive/Obstetrics negative OB ROS                            Anesthesia Physical Anesthesia Plan  ASA: 3  Anesthesia Plan: General   Post-op Pain Management:    Induction: Intravenous  PONV Risk Score and Plan: 2 and Ondansetron, Dexamethasone and Treatment may vary due to age or medical condition  Airway Management Planned: Oral ETT  Additional Equipment:   Intra-op Plan:   Post-operative Plan: Extubation in OR  Informed Consent: I have reviewed the patients History and Physical, chart, labs and discussed the procedure including the risks, benefits and alternatives for the proposed anesthesia with the patient or authorized representative who has indicated his/her understanding and acceptance.     Dental advisory given  Plan Discussed with: CRNA and Surgeon  Anesthesia Plan Comments:         Anesthesia Quick Evaluation

## 2020-09-25 ENCOUNTER — Ambulatory Visit (HOSPITAL_COMMUNITY): Payer: Medicare Other | Admitting: Certified Registered Nurse Anesthetist

## 2020-09-25 ENCOUNTER — Encounter (HOSPITAL_COMMUNITY): Payer: Self-pay | Admitting: Urology

## 2020-09-25 ENCOUNTER — Ambulatory Visit (HOSPITAL_COMMUNITY): Payer: Medicare Other | Admitting: Physician Assistant

## 2020-09-25 ENCOUNTER — Ambulatory Visit (HOSPITAL_COMMUNITY)
Admission: RE | Admit: 2020-09-25 | Discharge: 2020-09-25 | Disposition: A | Discharge status: Home / Self Care | Payer: Medicare Other | Source: Ambulatory Visit | Attending: Urology | Admitting: Urology

## 2020-09-25 ENCOUNTER — Encounter (HOSPITAL_COMMUNITY)
Admission: RE | Disposition: A | Discharge status: Home / Self Care | Payer: Self-pay | Source: Ambulatory Visit | Attending: Urology

## 2020-09-25 ENCOUNTER — Ambulatory Visit (HOSPITAL_COMMUNITY): Payer: Medicare Other

## 2020-09-25 DIAGNOSIS — Z794 Long term (current) use of insulin: Secondary | ICD-10-CM | POA: Insufficient documentation

## 2020-09-25 DIAGNOSIS — D303 Benign neoplasm of bladder: Secondary | ICD-10-CM | POA: Diagnosis not present

## 2020-09-25 DIAGNOSIS — N1339 Other hydronephrosis: Secondary | ICD-10-CM | POA: Insufficient documentation

## 2020-09-25 DIAGNOSIS — Z6834 Body mass index (BMI) 34.0-34.9, adult: Secondary | ICD-10-CM | POA: Diagnosis not present

## 2020-09-25 DIAGNOSIS — E119 Type 2 diabetes mellitus without complications: Secondary | ICD-10-CM | POA: Insufficient documentation

## 2020-09-25 DIAGNOSIS — Z7984 Long term (current) use of oral hypoglycemic drugs: Secondary | ICD-10-CM | POA: Diagnosis not present

## 2020-09-25 DIAGNOSIS — C679 Malignant neoplasm of bladder, unspecified: Secondary | ICD-10-CM | POA: Insufficient documentation

## 2020-09-25 DIAGNOSIS — N3020 Other chronic cystitis without hematuria: Secondary | ICD-10-CM | POA: Diagnosis not present

## 2020-09-25 DIAGNOSIS — Z87442 Personal history of urinary calculi: Secondary | ICD-10-CM | POA: Diagnosis not present

## 2020-09-25 DIAGNOSIS — Z833 Family history of diabetes mellitus: Secondary | ICD-10-CM | POA: Insufficient documentation

## 2020-09-25 DIAGNOSIS — N3289 Other specified disorders of bladder: Secondary | ICD-10-CM | POA: Diagnosis not present

## 2020-09-25 DIAGNOSIS — Z8616 Personal history of COVID-19: Secondary | ICD-10-CM | POA: Diagnosis not present

## 2020-09-25 DIAGNOSIS — Z800 Family history of malignant neoplasm of digestive organs: Secondary | ICD-10-CM | POA: Diagnosis not present

## 2020-09-25 DIAGNOSIS — I10 Essential (primary) hypertension: Secondary | ICD-10-CM | POA: Insufficient documentation

## 2020-09-25 DIAGNOSIS — E1165 Type 2 diabetes mellitus with hyperglycemia: Secondary | ICD-10-CM | POA: Diagnosis not present

## 2020-09-25 DIAGNOSIS — Z79899 Other long term (current) drug therapy: Secondary | ICD-10-CM | POA: Diagnosis not present

## 2020-09-25 DIAGNOSIS — Z8249 Family history of ischemic heart disease and other diseases of the circulatory system: Secondary | ICD-10-CM | POA: Insufficient documentation

## 2020-09-25 DIAGNOSIS — C652 Malignant neoplasm of left renal pelvis: Secondary | ICD-10-CM | POA: Diagnosis not present

## 2020-09-25 DIAGNOSIS — Z87891 Personal history of nicotine dependence: Secondary | ICD-10-CM | POA: Insufficient documentation

## 2020-09-25 DIAGNOSIS — E669 Obesity, unspecified: Secondary | ICD-10-CM | POA: Insufficient documentation

## 2020-09-25 DIAGNOSIS — Z807 Family history of other malignant neoplasms of lymphoid, hematopoietic and related tissues: Secondary | ICD-10-CM | POA: Insufficient documentation

## 2020-09-25 DIAGNOSIS — Z8261 Family history of arthritis: Secondary | ICD-10-CM | POA: Insufficient documentation

## 2020-09-25 DIAGNOSIS — Z8551 Personal history of malignant neoplasm of bladder: Secondary | ICD-10-CM | POA: Diagnosis not present

## 2020-09-25 HISTORY — PX: TRANSURETHRAL RESECTION OF BLADDER TUMOR: SHX2575

## 2020-09-25 HISTORY — PX: CYSTOSCOPY/RETROGRADE/URETEROSCOPY: SHX5316

## 2020-09-25 LAB — GLUCOSE, CAPILLARY
Glucose-Capillary: 139 mg/dL — ABNORMAL HIGH (ref 70–99)
Glucose-Capillary: 115 mg/dL — ABNORMAL HIGH (ref 70–99)

## 2020-09-25 SURGERY — CYSTOSCOPY/RETROGRADE/URETEROSCOPY
Anesthesia: General

## 2020-09-25 MED ORDER — ONDANSETRON HCL 4 MG/2ML IJ SOLN
INTRAMUSCULAR | Status: AC
  Filled 2020-09-25: qty 2

## 2020-09-25 MED ORDER — SODIUM CHLORIDE 0.9 % IR SOLN
Status: DC | PRN
  Administered 2020-09-25: 9000 mL

## 2020-09-25 MED ORDER — LIDOCAINE HCL URETHRAL/MUCOSAL 2 % EX GEL
CUTANEOUS | Status: DC | PRN
  Administered 2020-09-25: 1

## 2020-09-25 MED ORDER — HYDROMORPHONE HCL 1 MG/ML IJ SOLN
INTRAMUSCULAR | Status: AC
  Administered 2020-09-25: 0.5 mg via INTRAVENOUS
  Filled 2020-09-25: qty 1

## 2020-09-25 MED ORDER — DEXAMETHASONE SODIUM PHOSPHATE 4 MG/ML IJ SOLN
INTRAMUSCULAR | Status: DC | PRN
  Administered 2020-09-25: 5 mg via INTRAVENOUS

## 2020-09-25 MED ORDER — ORAL CARE MOUTH RINSE: 15.0000 mL | Freq: Once | OROMUCOSAL | Status: DC

## 2020-09-25 MED ORDER — KETOROLAC TROMETHAMINE 10 MG PO TABS: 10.0000 mg | ORAL_TABLET | Freq: Three times a day (TID) | ORAL | 0 refills | Status: DC | PRN

## 2020-09-25 MED ORDER — LACTATED RINGERS IV SOLN: INTRAVENOUS | Status: DC

## 2020-09-25 MED ORDER — CHLORHEXIDINE GLUCONATE 0.12 % MT SOLN
15.0000 mL | Freq: Once | OROMUCOSAL | Status: AC
  Administered 2020-09-25: 15 mL via OROMUCOSAL

## 2020-09-25 MED ORDER — HYDROMORPHONE HCL 1 MG/ML IJ SOLN
INTRAMUSCULAR | Status: AC
  Administered 2020-09-25: 0.25 mg via INTRAVENOUS
  Filled 2020-09-25: qty 1

## 2020-09-25 MED ORDER — BELLADONNA ALKALOIDS-OPIUM 16.2-30 MG RE SUPP
RECTAL | Status: AC
  Filled 2020-09-25: qty 1

## 2020-09-25 MED ORDER — HYDROMORPHONE HCL 1 MG/ML IJ SOLN
0.2500 mg | INTRAMUSCULAR | Status: DC | PRN
  Administered 2020-09-25: 0.5 mg via INTRAVENOUS
  Administered 2020-09-25 (×2): 0.25 mg via INTRAVENOUS
  Administered 2020-09-25: 0.5 mg via INTRAVENOUS

## 2020-09-25 MED ORDER — ONDANSETRON HCL 4 MG/2ML IJ SOLN
INTRAMUSCULAR | Status: DC | PRN
  Administered 2020-09-25: 4 mg via INTRAVENOUS

## 2020-09-25 MED ORDER — KETAMINE HCL 10 MG/ML IJ SOLN
INTRAMUSCULAR | Status: AC
  Filled 2020-09-25: qty 1

## 2020-09-25 MED ORDER — ACETAMINOPHEN 10 MG/ML IV SOLN
1000.0000 mg | Freq: Once | INTRAVENOUS | Status: AC | PRN
  Administered 2020-09-25: 1000 mg via INTRAVENOUS

## 2020-09-25 MED ORDER — PROPOFOL 500 MG/50ML IV EMUL
INTRAVENOUS | Status: AC
  Filled 2020-09-25: qty 50

## 2020-09-25 MED ORDER — KETOROLAC TROMETHAMINE 30 MG/ML IJ SOLN: 30.0000 mg | Freq: Once | INTRAMUSCULAR | Status: DC | PRN

## 2020-09-25 MED ORDER — FENTANYL CITRATE (PF) 100 MCG/2ML IJ SOLN
INTRAMUSCULAR | Status: AC
  Filled 2020-09-25: qty 2

## 2020-09-25 MED ORDER — HYDROMORPHONE HCL 1 MG/ML IJ SOLN: 0.2500 mg | INTRAMUSCULAR | Status: DC | PRN

## 2020-09-25 MED ORDER — OXYCODONE HCL 5 MG/5ML PO SOLN
5.0000 mg | Freq: Once | ORAL | Status: AC | PRN
Start: 2020-09-25 — End: 2020-09-25

## 2020-09-25 MED ORDER — HYDROMORPHONE HCL 2 MG/ML IJ SOLN
INTRAMUSCULAR | Status: AC
  Filled 2020-09-25: qty 1

## 2020-09-25 MED ORDER — OXYCODONE-ACETAMINOPHEN 5-325 MG PO TABS
1.0000 | ORAL_TABLET | Freq: Four times a day (QID) | ORAL | 0 refills | Status: DC | PRN
Start: 2020-09-25 — End: 2020-11-15

## 2020-09-25 MED ORDER — ORAL CARE MOUTH RINSE: 15.0000 mL | Freq: Once | OROMUCOSAL | Status: AC

## 2020-09-25 MED ORDER — PROPOFOL 10 MG/ML IV BOLUS
INTRAVENOUS | Status: DC | PRN
  Administered 2020-09-25: 140 mg via INTRAVENOUS
  Administered 2020-09-25: 60 mg via INTRAVENOUS

## 2020-09-25 MED ORDER — DEXAMETHASONE SODIUM PHOSPHATE 10 MG/ML IJ SOLN
INTRAMUSCULAR | Status: AC
  Filled 2020-09-25: qty 1

## 2020-09-25 MED ORDER — LIDOCAINE HCL (PF) 2 % IJ SOLN
INTRAMUSCULAR | Status: AC
  Filled 2020-09-25: qty 5

## 2020-09-25 MED ORDER — ROCURONIUM BROMIDE 10 MG/ML (PF) SYRINGE
PREFILLED_SYRINGE | INTRAVENOUS | Status: AC
  Filled 2020-09-25: qty 10

## 2020-09-25 MED ORDER — LIDOCAINE HCL URETHRAL/MUCOSAL 2 % EX GEL
CUTANEOUS | Status: AC
  Filled 2020-09-25: qty 30

## 2020-09-25 MED ORDER — OXYCODONE HCL 5 MG PO TABS
5.0000 mg | ORAL_TABLET | Freq: Once | ORAL | Status: AC | PRN
Start: 2020-09-25 — End: 2020-09-25
  Administered 2020-09-25: 5 mg via ORAL

## 2020-09-25 MED ORDER — KETOROLAC TROMETHAMINE 30 MG/ML IJ SOLN
INTRAMUSCULAR | Status: DC | PRN
  Administered 2020-09-25: 30 mg via INTRAVENOUS

## 2020-09-25 MED ORDER — IOHEXOL 300 MG/ML  SOLN
INTRAMUSCULAR | Status: DC | PRN
  Administered 2020-09-25: 15 mL

## 2020-09-25 MED ORDER — FENTANYL CITRATE (PF) 100 MCG/2ML IJ SOLN
INTRAMUSCULAR | Status: DC | PRN
  Administered 2020-09-25: 100 ug via INTRAVENOUS

## 2020-09-25 MED ORDER — OXYCODONE HCL 5 MG PO TABS
ORAL_TABLET | ORAL | Status: AC
  Filled 2020-09-25: qty 1

## 2020-09-25 MED ORDER — KETAMINE HCL 10 MG/ML IJ SOLN
INTRAMUSCULAR | Status: DC | PRN
  Administered 2020-09-25: 50 mg via INTRAVENOUS

## 2020-09-25 MED ORDER — HYDROMORPHONE HCL 1 MG/ML IJ SOLN
INTRAMUSCULAR | Status: DC | PRN
  Administered 2020-09-25: 1 mg via INTRAVENOUS

## 2020-09-25 MED ORDER — SUGAMMADEX SODIUM 200 MG/2ML IV SOLN
INTRAVENOUS | Status: DC | PRN
  Administered 2020-09-25: 200 mg via INTRAVENOUS

## 2020-09-25 MED ORDER — SENNOSIDES-DOCUSATE SODIUM 8.6-50 MG PO TABS: 1.0000 | ORAL_TABLET | Freq: Two times a day (BID) | ORAL | 0 refills | Status: DC

## 2020-09-25 MED ORDER — CHLORHEXIDINE GLUCONATE 0.12 % MT SOLN: 15.0000 mL | Freq: Once | OROMUCOSAL | Status: DC

## 2020-09-25 MED ORDER — BELLADONNA ALKALOIDS-OPIUM 16.2-60 MG RE SUPP
RECTAL | Status: DC | PRN
  Administered 2020-09-25: 1 via RECTAL

## 2020-09-25 MED ORDER — ACETAMINOPHEN 10 MG/ML IV SOLN
INTRAVENOUS | Status: AC
  Filled 2020-09-25: qty 100

## 2020-09-25 MED ORDER — ONDANSETRON HCL 4 MG/2ML IJ SOLN: 4.0000 mg | Freq: Once | INTRAMUSCULAR | Status: DC | PRN

## 2020-09-25 MED ORDER — ROCURONIUM BROMIDE 10 MG/ML (PF) SYRINGE
PREFILLED_SYRINGE | INTRAVENOUS | Status: DC | PRN
  Administered 2020-09-25: 50 mg via INTRAVENOUS

## 2020-09-25 MED ORDER — HYDROMORPHONE HCL 1 MG/ML IJ SOLN
0.2500 mg | INTRAMUSCULAR | Status: DC | PRN
Start: 2020-09-25 — End: 2020-09-25
  Administered 2020-09-25: 0.25 mg via INTRAVENOUS

## 2020-09-25 MED ORDER — LIDOCAINE 2% (20 MG/ML) 5 ML SYRINGE
INTRAMUSCULAR | Status: DC | PRN
  Administered 2020-09-25: 40 mg via INTRAVENOUS

## 2020-09-25 SURGICAL SUPPLY — 28 items
BAG URINE DRAIN 2000ML AR STRL (UROLOGICAL SUPPLIES) IMPLANT
BAG URO CATCHER STRL LF (MISCELLANEOUS) ×3 IMPLANT
BASKET LASER NITINOL 1.9FR (BASKET) ×3 IMPLANT
CATH INTERMIT  6FR 70CM (CATHETERS) ×3 IMPLANT
CLOTH BEACON ORANGE TIMEOUT ST (SAFETY) ×3 IMPLANT
DRAPE FOOT SWITCH (DRAPES) ×3 IMPLANT
ELECT REM PT RETURN 15FT ADLT (MISCELLANEOUS) ×3 IMPLANT
EVACUATOR MICROVAS BLADDER (UROLOGICAL SUPPLIES) IMPLANT
EXTRACTOR STONE 1.7FRX115CM (UROLOGICAL SUPPLIES) IMPLANT
GLOVE SURG ENC TEXT LTX SZ7.5 (GLOVE) ×3 IMPLANT
GOWN STRL REUS W/TWL LRG LVL3 (GOWN DISPOSABLE) ×3 IMPLANT
GUIDEWIRE ANG ZIPWIRE 038X150 (WIRE) ×6 IMPLANT
GUIDEWIRE STR DUAL SENSOR (WIRE) ×6 IMPLANT
KIT TURNOVER KIT A (KITS) ×3 IMPLANT
LASER FIB FLEXIVA PULSE ID 365 (Laser) IMPLANT
LOOP CUT BIPOLAR 24F LRG (ELECTROSURGICAL) ×3 IMPLANT
MANIFOLD NEPTUNE II (INSTRUMENTS) ×3 IMPLANT
PACK CYSTO (CUSTOM PROCEDURE TRAY) ×3 IMPLANT
SHEATH URETERAL 12FRX28CM (UROLOGICAL SUPPLIES) IMPLANT
SHEATH URETERAL 12FRX35CM (MISCELLANEOUS) ×3 IMPLANT
STENT URET 6FRX26 CONTOUR (STENTS) ×3 IMPLANT
SYR TOOMEY IRRIG 70ML (MISCELLANEOUS) ×3
SYRINGE TOOMEY IRRIG 70ML (MISCELLANEOUS) ×2 IMPLANT
TRACTIP FLEXIVA PULS ID 200XHI (Laser) IMPLANT
TRACTIP FLEXIVA PULSE ID 200 (Laser)
TUBE FEEDING 8FR 16IN STR KANG (MISCELLANEOUS) ×3 IMPLANT
TUBING CONNECTING 10 (TUBING) ×3 IMPLANT
TUBING UROLOGY SET (TUBING) ×3 IMPLANT

## 2020-09-25 NOTE — H&P (Signed)
Frank Ware is an 70 y.o. male.    Chief Complaint: Pre-OP Restaging TURBT, BILATERAL Diagnostic Ureteroscopy / Possible Renal Pelvis Biopsy  HPI:   1 - Multifocal Urothelial Carcinoma / Left Renal Pelvis Cancer / HIgh Grade Bladder Cancer -  08/2020 - Large voluem left renal pelvis + bladder tumor (T1G3) by TURBT / Left U-Scope on eval hematuria and bladder mass. 1 artery / 1 vein left renovascular anatomy. CT localized, Cr <1.5.   2 - Recurrent Urolithiasis - years of recurrent left sided stones req procedureal intervention x several. Never right sided. CT 2022 with some patchy left papillary tip calcs no right sided stones.   PMH sig for obesity, DM2, HTN, Ware risk stress test (follows [facility] cards in [facility]). His PCP is Frank Che MD with [facility] in [place]. He is retired from [employer], retried in [place] to be clsoe to family. His wife Frank Ware is very involved.   Today "Frank Ware" is seen to proceed with restaging TURBT, bilateral diagnostic ureteroscopy in effort to form final plan for management of his high risk multifocal urothelial carcinoma. No interval fevers. Cr <1. Most recent UA without infectious parameters.    Past Medical History:  Diagnosis Date   Abnormal stress test 12/03/2019   Acute pain 12/27/2018   Arthropathy of cervical facet joint 12/14/2016   Bilateral occipital neuralgia 10/13/2016   Body mass index 34.0-34.9, adult 09/02/2013   BPPV (benign paroxysmal positional vertigo)    Cervical pseudoarthrosis, sequela 02/07/2017   Cervical spinal stenosis 10/13/2016   Chronic pain syndrome 99991111   Diastolic dysfunction 0000000   Duodenal ulcer    Non-bleeding on EGD, March 2021   Dyslipidemia 11/18/2019   Encounter for therapeutic drug monitoring 10/13/2016   Essential hypertension 11/18/2019   Hiatal hernia    High cholesterol    History of COVID-19 06/2019   History of kidney stones    Hypertension    Postlaminectomy syndrome of  cervical region 10/13/2016   Precordial chest pain 11/18/2019   Presence of right artificial knee joint 10/13/2016   Trochanteric bursitis of right hip 10/13/2016   Type 2 diabetes mellitus with complication, without long-term current use of insulin ([place]) 11/18/2019   Type 2 diabetes mellitus with hyperglycemia, without long-term current use of insulin ([place])    Vitamin D deficiency     Past Surgical History:  Procedure Laterality Date   BLADDER TUMOR EXCISION     CERVICAL FUSION     C3-C4 1992 and C5-C6 2001.   CERVICAL FUSION     x 2vc   COLONOSCOPY     CYSTOSCOPY/URETEROSCOPY/HOLMIUM LASER/STENT PLACEMENT Left 11/22/2018   Procedure: CYSTOSCOPY LEFT URETEROSCOPY/HOLMIUM LASER/STENT EXCHANGE;  Surgeon: Irine Seal, MD;  [facility];  Service: Urology;  Laterality: Left;   CYSTOSCOPY/URETEROSCOPY/HOLMIUM LASER/STENT PLACEMENT Left 12/25/2018   Procedure: CYSTOSCOPY LEFT RETROGRADE PYELOGRAM LEFT URETEROSCOPY WITH HOLMIUM LASER LEFT STENT EXCHANGE;  Surgeon: Irine Seal, MD;  Location[facility];  Service: Urology;  Laterality: Left;   KNEE ARTHROPLASTY Right    KNEE ARTHROSCOPY Bilateral 1985   left knee arthroscopy      LITHOTRIPSY     right kne e replacement      TONSILLECTOMY     TRANSURETHRAL RESECTION OF BLADDER TUMOR WITH MITOMYCIN-C Bilateral 08/17/2020   Procedure: TRANSURETHRAL RESECTION OF BLADDER TUMOR , CYSTOSCOPY BILATERAL RETROGRADES;  Surgeon: Irine Seal, MD;  Location: [facility];  Service: Urology;  Laterality: Bilateral;   UPPER GI ENDOSCOPY  Family History  Problem Relation Age of Onset   Lymphoma Brother    Pancreatic cancer Brother    Diabetes Maternal Grandfather    Hypertension Maternal Grandfather    Arthritis Mother    Arthritis Sister    Heart attack Paternal Grandfather    Colon cancer Neg Hx    Colon polyps Neg Hx    Esophageal cancer Neg Hx    Rectal cancer Neg Hx    Stomach cancer Neg Hx    Social  History:  reports that he quit smoking about 25 years ago. His smoking use included cigarettes. He has never used smokeless tobacco. He reports that he does not drink alcohol and does not use drugs.  Allergies: No Known Allergies  Medications Prior to Admission  Medication Sig Dispense Refill   acetaminophen (TYLENOL) 325 MG tablet Take 650 mg by mouth every 6 (six) hours as needed for mild pain, fever or headache.     amLODipine (NORVASC) 2.5 MG tablet Take 2.5 mg by mouth daily.     atorvastatin (LIPITOR) 20 MG tablet Take 20 mg by mouth daily.      buprenorphine (SUBUTEX) 2 MG SUBL SL tablet Place 2 mg under the tongue every 12 (twelve) hours as needed (pain).     glipiZIDE (GLUCOTROL XL) 10 MG 24 hr tablet Take 10 mg by mouth in the morning and at bedtime.      metFORMIN (GLUCOPHAGE) 1000 MG tablet Take 1,000 mg by mouth 2 (two) times daily with a meal.      mupirocin ointment (BACTROBAN) 2 % Apply 1 application topically 2 (two) times daily as needed for irritation.     nitroGLYCERIN (NITROSTAT) 0.4 MG SL tablet Place 1 tablet (0.4 mg total) under the tongue every 5 (five) minutes as needed. 25 tablet 11   olmesartan (BENICAR) 40 MG tablet Take 40 mg by mouth daily.      omeprazole (PRILOSEC) 40 MG capsule Take 40 mg by mouth daily.     oxybutynin (DITROPAN) 5 MG tablet Take 5 mg by mouth 3 (three) times daily as needed for bladder spasms.     ranolazine (RANEXA) 500 MG 12 hr tablet Take 1 tablet (500 mg total) by mouth 2 (two) times daily. 180 tablet 3   traMADol (ULTRAM) 50 MG tablet Take 50 mg by mouth every 6 (six) hours as needed for moderate pain or severe pain.     zolpidem (AMBIEN) 10 MG tablet Take 10 mg by mouth at bedtime.     HYDROcodone-acetaminophen (NORCO/VICODIN) 5-325 MG tablet Take 1 tablet by mouth every 4 (four) hours as needed for moderate pain. (Patient not taking: No sig reported) 8 tablet 0    Results for orders placed or performed during the hospital encounter of  09/25/20 (from the past 48 hour(s))  Glucose, capillary     Status: Abnormal   Collection Time: 09/25/20  5:45 AM  Result Value Ref Range   Glucose-Capillary 139 (H) 70 - 99 mg/dL    Comment: Glucose reference range applies only to samples taken after fasting for at least 8 hours.   No results found.  Review of Systems  Constitutional:  Negative for chills.  HENT:  Positive for dental problem.   All other systems reviewed and are negative.  Blood pressure 115/83, pulse 83, temperature 97.9 F (36.6 C), temperature source Oral, resp. rate 18, height '5\' 6"'$  (1.676 m), weight 98 kg, SpO2 97 %. Physical Exam Vitals reviewed.  HENT:  Head: Normocephalic.     Nose: Nose normal.  Cardiovascular:     Rate and Rhythm: Normal rate.  Pulmonary:     Effort: Pulmonary effort is normal.  Abdominal:     General: Abdomen is flat.  Genitourinary:    Comments: No CVAT at present.  Musculoskeletal:        General: Normal range of motion.     Cervical back: Normal range of motion.  Neurological:     General: No focal deficit present.     Mental Status: He is alert.  Psychiatric:        Mood and Affect: Mood normal.     Assessment/Plan  Proceed as planned with TURBT, Bilateral retrogrades / diagostic ureteroscopy / possilbe biopsy. Risks, benefits, alternatives, expcted peri-op course discussed previousloy and reiterated today.   Alexis Frock, MD 09/25/2020, 7:00 AM

## 2020-09-25 NOTE — Anesthesia Procedure Notes (Signed)
Procedure Name: Intubation Date/Time: 09/25/2020 7:40 AM Performed by: Claudia Desanctis, CRNA Pre-anesthesia Checklist: Patient identified, Emergency Drugs available, Suction available and Patient being monitored Patient Re-evaluated:Patient Re-evaluated prior to induction Oxygen Delivery Method: Circle system utilized Preoxygenation: Pre-oxygenation with 100% oxygen Induction Type: IV induction Ventilation: Mask ventilation without difficulty and Oral airway inserted - appropriate to patient size Laryngoscope Size: 2 and Miller Grade View: Grade II Tube type: Oral Tube size: 7.5 mm Number of attempts: 1 Airway Equipment and Method: Stylet Placement Confirmation: ETT inserted through vocal cords under direct vision, positive ETCO2 and breath sounds checked- equal and bilateral Secured at: 22 cm Tube secured with: Tape Dental Injury: Teeth and Oropharynx as per pre-operative assessment

## 2020-09-25 NOTE — Brief Op Note (Signed)
09/25/2020  8:33 AM  PATIENT:  Frank Ware  70 y.o. male  PRE-OPERATIVE DIAGNOSIS:  BLADDER AND LEFT RENAL PELVIS CANCER  POST-OPERATIVE DIAGNOSIS:  BLADDER AND LEFT RENAL PELVIS CANCER  PROCEDURE:  Procedure(s): CYSTOSCOPY/BILATERAL RETROGRADE/ RIGHT DIAGNOSTIC URETEROSCOPY/ LEFT URETERSOSCOPY WITH BIOPSY AND STENT (Bilateral) RESTAGING TRANSURETHRAL RESECTION OF BLADDER TUMOR (TURBT) (N/A)  SURGEON:  Surgeon(s) and Role:    Alexis Frock, MD - Primary  PHYSICIAN ASSISTANT:   ASSISTANTS: none   ANESTHESIA:   general  EBL:  50 mL   BLOOD ADMINISTERED:none  DRAINS: none   LOCAL MEDICATIONS USED:  NONE  SPECIMEN:  Source of Specimen:  left renal pelvis tumor; old bladder resection site, base of old bladder resection site  DISPOSITION OF SPECIMEN:  PATHOLOGY  COUNTS:  YES  TOURNIQUET:  * No tourniquets in log *  DICTATION: .Other Dictation: Dictation Number [id number]  PLAN OF CARE: Discharge to home after PACU  PATIENT DISPOSITION:  PACU - hemodynamically stable.   Delay start of Pharmacological VTE agent (>24hrs) due to surgical blood loss or risk of bleeding: yes

## 2020-09-25 NOTE — Discharge Instructions (Signed)
1 - You may have urinary urgency (bladder spasms) and bloody urine on / off with stent in place. This is normal. ° °2 - Call MD or go to ER for fever >102, severe pain / nausea / vomiting not relieved by medications, or acute change in medical status ° °

## 2020-09-25 NOTE — Transfer of Care (Signed)
Immediate Anesthesia Transfer of Care Note  Patient: Frank Ware  Procedure(s) Performed: CYSTOSCOPY/BILATERAL RETROGRADE/ RIGHT DIAGNOSTIC URETEROSCOPY/ LEFT URETERSOSCOPY WITH BIOPSY AND STENT (Bilateral) RESTAGING TRANSURETHRAL RESECTION OF BLADDER TUMOR (TURBT)  Patient Location: PACU  Anesthesia Type:General  Level of Consciousness: awake and patient cooperative  Airway & Oxygen Therapy: Patient Spontanous Breathing and Patient connected to face mask  Post-op Assessment: Report given to RN and Post -op Vital signs reviewed and stable  Post vital signs: Reviewed and stable  Last Vitals:  Vitals Value Taken Time  BP 142/85 09/25/20 0845  Temp 36.6 C 09/25/20 0842  Pulse 93 09/25/20 0845  Resp 11 09/25/20 0845  SpO2 100 % 09/25/20 0845  Vitals shown include unvalidated device data.  Last Pain:  Vitals:   09/25/20 0610  TempSrc:   PainSc: 0-No pain         Complications: No notable events documented.

## 2020-09-25 NOTE — Op Note (Signed)
NAMEMARKI, Frank Ware MEDICAL RECORD NO: [id number] ACCOUNT NO: [id number] DATE OF BIRTH: 05/16/1950 FACILITY: [facility] LOCATION: [facility] PHYSICIAN: Alexis Frock, MD  Operative Report   PREOPERATIVE DIAGNOSES:  Left renal pelvis cancer, high-grade bladder cancer.  PROCEDURE PERFORMED: 1.  Cystoscopy with bilateral retrograde pyelograms. 2.  Left ureteroscopy with biopsy. 3.  Right diagnostic ureteroscopy. 4.  Insertion of left ureteral stent, 6 x 26 contour. 5.  Transection of bladder tumor, volume medium.  ESTIMATED BLOOD LOSS:  Nil.  COMPLICATIONS:  None.  SPECIMENS: 1.  Left renal pelvis tumor for pathology. 2.  Old resection site bladder. 3.  Basal resection site bladder.  FINDINGS: 1.  Significant fibrinous tissue within the urinary bladder, but no obvious residual intraluminal tumor. 2.  Unremarkable right pyelogram. 3.  Multifocal filling defects with left retrograde pyelogram. 4.  Large volume multifocal papillary left renal pelvis tumor mostly in the upper pole. 5. Successful placement of left ureteral stent, proximal end in renal pelvis, distal end in urinary bladder  INDICATIONS:  The patient is a pleasant 70 year old man with known history of multifocal urothelial carcinoma involving his left kidney and his bladder.  He was referred for consideration of surgical  therapy with nephroureterectomy versus nephroureterectomy  plus cystectomy.  The patient does desire a possible bladder preservation if possible.  We discussed the role of restaging to help rule out residual rapidly recurrent bladder cancer and he wished to proceed with restaging.  He presents for this today.   Informed consent was obtained and placed in the medical record.  DETAILS OF PROCEDURE:  The patient being himself verified, procedure being cysto, bilateral retrograde, ureteroscopy and transection of bladder tumor was confirmed.  Procedure timeout was performed.  Intravenous antibiotics administered.   General  endotracheal anesthesia was introduced.  The patient was placed into a low lithotomy position, sterile field was created, prepped and draped the patient's penis, perineum and proximal thigh using iodine.  Cystourethroscopy was initially performed using  21-French rigid cystoscope with offset lens.  Inspection of the anterior and posterior urethra was unremarkable.  Inspection of bladder revealed significant fibrinous debris at some old resection site without any obvious recurrent papillary tumor.   Fibrinous debris was irrigated and set aside for discard.  Attention was directed to right retrograde pyelogram.  The right ureteral orifice was cannulated with a 6-French end-hole catheter and right retrograde pyelogram was obtained.  Right retrograde pyelogram demonstrated a single right ureter, single system right kidney.  No filling defects or narrowing noted.  The ZIPwire was advanced to the level of the upper pole, set aside as a safety wire.  Next, left retrograde pyelogram was  obtained.  Left retrograde pyelogram demonstrated a single left ureter, single system left kidney.  There were multifocal filling defects consistent with known renal pelvis tumor with some intrarenal hydronephrosis. ZIPwire was once again advanced, set aside as a  safety wire on the left side.  Next, semirigid ureteroscopy was performed of the distal two-thirds of the left ureter alongside a separate sensor working wire.  No significant mucosal abnormalities were found.  Next, semirigid ureteroscopy was performed  of the distal two-thirds of the right ureter alongside a separate sensor working wire.  No mucosal abnormalities were found.  The right ureter was more narrow caliber than the left.  Next, a 12/14 medium length ureteral access sheath was placed over the  left sensor working wire at the level of the proximal left ureter and systematic ureteroscopy was performed of the  proximal left ureter and left kidney.  There  was a very large volume of left renal pelvis tumor, papillary in nature.  Photodocumentation  was performed.  This was mostly involving the upper pole.  This was not amenable to endoscopic resection whatsoever.  Biopsy was obtained using Escape basket and a snaring technique snaring, snaring several aspects of representative papillary tumor,  which was then removed and set aside for permanent pathology.  Access sheath removed under continuous vision.  No significant mucosal abnormalities were found.  The ureteroscope was then placed over the right sensor working wire without the sheath at the  level of the proximal ureter with intent to completely inspect the right sided kidney and ureter; however, due to the narrow ureteral caliber, even the single channel ureteroscope would not easily pass.  As retrograde was normal, it was felt that  inspection of the kidney would not be 100% warranted and that the risk/benefit profile favored no active dilation.  As such, the ureteroscope was removed under continuous vision and no significant mucosal abnormalities were found in the right.  Safety  wire was removed.  Next, a 6 x 26 contour type stent was placed over the left with safety wire using cystoscopic and fluoroscopic guidance.  Good proximal and distal plane were noted.  Next, the cystoscope was exchanged for the 26-French resectoscope  sheath with visual obturator and using resectoscope loop, very careful resection was performed of the old resection site on the left bladder wall and intertrigone area down to the superficial fibromuscular stroma and urinary bladder.  These fragments set  aside, labeled as old resection site.  Next, representative areas of the base of this were obtained using cold cup biopsy forceps, set aside, labeled as base of old resection site.  The area of resection site was then completely fulgurated with  resectoscope loop, which revealed excellent hemostasis.  No evidence of any  perforation.  There was an additional old resection site more towards the right dome; however, this was visually without recurrence and given his dome location, not felt good to  be sampled otherwise. Bladder was then partially emptied per cystoscope.  A B and O type suppository was placed as well as Uro-Jet lidocaine.  The procedure was terminated.  The patient tolerated the procedure well.  No immediate perioperative  complications.  The patient taken to postanesthesia care unit in stable condition.  Plan for discharge home after void.   NIK D: 09/25/2020 8:40:34 am T: 09/25/2020 11:23:00 am  JOB: [id number]

## 2020-09-26 ENCOUNTER — Encounter (HOSPITAL_COMMUNITY): Payer: Self-pay | Admitting: Urology

## 2020-09-28 LAB — SURGICAL PATHOLOGY

## 2020-10-05 NOTE — Anesthesia Postprocedure Evaluation (Signed)
Anesthesia Post Note  Patient: Frank Ware  Procedure(s) Performed: CYSTOSCOPY/BILATERAL RETROGRADE/ RIGHT DIAGNOSTIC URETEROSCOPY/ LEFT URETERSOSCOPY WITH BIOPSY AND STENT (Bilateral) RESTAGING TRANSURETHRAL RESECTION OF BLADDER TUMOR (TURBT)     Patient location during evaluation: PACU Anesthesia Type: General Level of consciousness: awake and alert Pain management: pain level controlled Vital Signs Assessment: post-procedure vital signs reviewed and stable Respiratory status: spontaneous breathing, nonlabored ventilation, respiratory function stable and patient connected to nasal cannula oxygen Cardiovascular status: blood pressure returned to baseline and stable Postop Assessment: no apparent nausea or vomiting Anesthetic complications: no   No notable events documented.  Last Vitals:  Vitals:   09/25/20 1000 09/25/20 1015  BP: 128/65 (!) 145/87  Pulse:  94  Resp:  16  Temp:  36.6 C  SpO2:  98%    Last Pain:  Vitals:   09/25/20 1015  TempSrc:   PainSc: 3                  [name]

## 2020-10-08 DIAGNOSIS — C678 Malignant neoplasm of overlapping sites of bladder: Secondary | ICD-10-CM | POA: Diagnosis not present

## 2020-10-08 DIAGNOSIS — N200 Calculus of kidney: Secondary | ICD-10-CM | POA: Diagnosis not present

## 2020-10-13 ENCOUNTER — Other Ambulatory Visit: Payer: Self-pay | Admitting: Urology

## 2020-10-21 ENCOUNTER — Other Ambulatory Visit (HOSPITAL_COMMUNITY): Payer: Self-pay

## 2020-10-26 NOTE — Patient Instructions (Signed)
DUE TO COVID-19 ONLY ONE VISITOR IS ALLOWED TO COME WITH YOU AND STAY IN THE WAITING ROOM ONLY DURING PRE OP AND PROCEDURE.   **NO VISITORS ARE ALLOWED IN THE SHORT STAY AREA OR RECOVERY ROOM!!**  IF YOU WILL BE ADMITTED INTO THE HOSPITAL YOU ARE ALLOWED ONLY TWO SUPPORT PEOPLE DURING VISITATION HOURS ONLY (10AM -8PM)   The support person(s) may change daily. The support person(s) must pass our screening, gel in and out, and wear a mask at all times, including in the patient's room. Patients must also wear a mask when staff or their support person are in the room.  No visitors under the age of 35. Any visitor under the age of 60 must be accompanied by an adult.    COVID SWAB TESTING MUST BE COMPLETED ON: 11/11/20                          8A - 3P **MUST PRESENT COMPLETED FORM AT TESTING SITE**    Between [facility] (backside of the building) You are not required to quarantine, however you are required to wear a well-fitted mask when you are out and around people not in your household.  Hand Hygiene often Do NOT share personal items Notify your provider if you are in close contact with someone who has COVID or you develop fever 100.4 or greater, new onset of sneezing, cough, sore throat, shortness of breath or body aches.  [address], [address], must go inside of the hospital, NOT A DRIVE THRU!  (Must self quarantine after testing. Follow instructions on handout.)       Your procedure is scheduled on: 11/13/20   Report to [facility] Main Entrance    Report to admitting at: 9:45 AM   Call this number if you have problems the morning of surgery [phone]   May have liquids until    day of surgery  CLEAR LIQUID DIET : Starting the the before surgery. May have clear liquids until: 9:00 AM  day of surgery.  Foods Allowed                                                                     Foods  Excluded  Water, Black Coffee and tea, regular and decaf                             liquids that you cannot  Plain Jell-O in any flavor  (No red)                                           see through such as: Fruit ices (not with fruit pulp)                                     milk, soups, orange juice              Iced Popsicles (No red)  All solid food                                   Apple juices Sports drinks like Gatorade (No red) Lightly seasoned clear broth or consume(fat free) Sugar,   Sample Menu Breakfast                                Lunch                                     Supper Cranberry juice                    Beef broth                            Chicken broth Jell-O                                     Grape juice                           Apple juice Coffee or tea                        Jell-O                                      Popsicle                                                Coffee or tea                        Coffee or tea    Oral Hygiene is also important to reduce your risk of infection.                                    Remember - BRUSH YOUR TEETH THE MORNING OF SURGERY WITH YOUR REGULAR TOOTHPASTE   Do NOT smoke after Midnight   Take these medicines the morning of surgery with A SIP OF WATER: ranexa,amlodipine,omeprazole,oxybutynin.  How to Manage Your Diabetes Before and After Surgery  Why is it important to control my blood sugar before and after surgery? Improving blood sugar levels before and after surgery helps healing and can limit problems. A way of improving blood sugar control is eating a healthy diet by:  Eating less sugar and carbohydrates  Increasing activity/exercise  Talking with your doctor about reaching your blood sugar goals High blood sugars (greater than 180 mg/dL) can raise your risk of infections and slow your recovery, so you will need to focus on controlling your diabetes during the  weeks before surgery. Make sure that the doctor who takes care of your diabetes knows about your planned surgery including the date and location.  How do I  manage my blood sugar before surgery? Check your blood sugar at least 4 times a day, starting 2 days before surgery, to make sure that the level is not too high or low. Check your blood sugar the morning of your surgery when you wake up and every 2 hours until you get to the Short Stay unit. If your blood sugar is less than 70 mg/dL, you will need to treat for low blood sugar: Do not take insulin. Treat a low blood sugar (less than 70 mg/dL) with  cup of clear juice (cranberry or apple), 4 glucose tablets, OR glucose gel. Recheck blood sugar in 15 minutes after treatment (to make sure it is greater than 70 mg/dL). If your blood sugar is not greater than 70 mg/dL on recheck, call [phone] for further instructions. Report your blood sugar to the short stay nurse when you get to Short Stay.  If you are admitted to the hospital after surgery: Your blood sugar will be checked by the staff and you will probably be given insulin after surgery (instead of oral diabetes medicines) to make sure you have good blood sugar levels. The goal for blood sugar control after surgery is 80-180 mg/dL.   WHAT DO I DO ABOUT MY DIABETES MEDICATION?  Do not take oral diabetes medicines (pills) the morning of surgery.  THE DAY BEFORE SURGERY, take metformin as usual. Take ONLY the morning dose of glipizide. DO NOT take the evening dose of glipizide.     THE MORNING OF SURGERY, DO NOT TAKE ANY ORAL DIABETIC MEDICATIONS DAY OF YOUR SURGERY                              You may not have any metal on your body including hair pins, jewelry, and body piercing             Do not wear lotions, powders, perfumes/cologne, or deodorant              Men may shave face and neck.   Do not bring valuables to the hospital. [place].   Contacts, dentures or bridgework may not be worn into surgery.   Bring small overnight bag day of surgery.    Patients discharged on the day of surgery will not be allowed to drive home.   Special Instructions: Bring a copy of your healthcare power of attorney and living will documents         the day of surgery if you haven't scanned them before.              Please read over the following fact sheets you were given: IF YOU HAVE QUESTIONS ABOUT YOUR PRE-OP INSTRUCTIONS PLEASE CALL [phone]   [facility] - Preparing for Surgery Before surgery, you can play an important role.  Because skin is not sterile, your skin needs to be as free of germs as possible.  You can reduce the number of germs on your skin by washing with CHG (chlorahexidine gluconate) soap before surgery.  CHG is an antiseptic cleaner which kills germs and bonds with the skin to continue killing germs even after washing. Please DO NOT use if you have an allergy to CHG or antibacterial soaps.  If your skin becomes reddened/irritated stop using the CHG and inform your nurse when you arrive at Short  Stay. Do not shave (including legs and underarms) for at least 48 hours prior to the first CHG shower.  You may shave your face/neck. Please follow these instructions carefully:  1.  Shower with CHG Soap the night before surgery and the  morning of Surgery.  2.  If you choose to wash your hair, wash your hair first as usual with your  normal  shampoo.  3.  After you shampoo, rinse your hair and body thoroughly to remove the  shampoo.                           4.  Use CHG as you would any other liquid soap.  You can apply chg directly  to the skin and wash                       Gently with a scrungie or clean washcloth.  5.  Apply the CHG Soap to your body ONLY FROM THE NECK DOWN.   Do not use on face/ open                           Wound or open sores. Avoid contact with eyes, ears mouth and genitals (private parts).                        Wash face,  Genitals (private parts) with your normal soap.             6.  Wash thoroughly, paying special attention to the area where your surgery  will be performed.  7.  Thoroughly rinse your body with warm water from the neck down.  8.  DO NOT shower/wash with your normal soap after using and rinsing off  the CHG Soap.                9.  Pat yourself dry with a clean towel.            10.  Wear clean pajamas.            11.  Place clean sheets on your bed the night of your first shower and do not  sleep with pets. Day of Surgery : Do not apply any lotions/deodorants the morning of surgery.  Please wear clean clothes to the hospital/surgery center.  FAILURE TO FOLLOW THESE INSTRUCTIONS MAY RESULT IN THE CANCELLATION OF YOUR SURGERY PATIENT SIGNATURE_________________________________  NURSE SIGNATURE__________________________________  ________________________________________________________________________

## 2020-10-27 ENCOUNTER — Encounter (HOSPITAL_COMMUNITY)
Admission: RE | Admit: 2020-10-27 | Discharge: 2020-10-27 | Disposition: A | Discharge status: Home / Self Care | Payer: Medicare Other | Source: Ambulatory Visit | Attending: Urology | Admitting: Urology

## 2020-10-27 ENCOUNTER — Encounter (HOSPITAL_COMMUNITY): Payer: Self-pay

## 2020-10-27 ENCOUNTER — Other Ambulatory Visit: Payer: Self-pay

## 2020-10-27 DIAGNOSIS — G4733 Obstructive sleep apnea (adult) (pediatric): Secondary | ICD-10-CM | POA: Insufficient documentation

## 2020-10-27 DIAGNOSIS — C652 Malignant neoplasm of left renal pelvis: Secondary | ICD-10-CM | POA: Insufficient documentation

## 2020-10-27 DIAGNOSIS — Z7984 Long term (current) use of oral hypoglycemic drugs: Secondary | ICD-10-CM | POA: Diagnosis not present

## 2020-10-27 DIAGNOSIS — Z01812 Encounter for preprocedural laboratory examination: Secondary | ICD-10-CM | POA: Insufficient documentation

## 2020-10-27 DIAGNOSIS — E119 Type 2 diabetes mellitus without complications: Secondary | ICD-10-CM | POA: Diagnosis not present

## 2020-10-27 DIAGNOSIS — D500 Iron deficiency anemia secondary to blood loss (chronic): Secondary | ICD-10-CM | POA: Diagnosis not present

## 2020-10-27 DIAGNOSIS — I10 Essential (primary) hypertension: Secondary | ICD-10-CM | POA: Insufficient documentation

## 2020-10-27 DIAGNOSIS — Z8616 Personal history of COVID-19: Secondary | ICD-10-CM | POA: Diagnosis not present

## 2020-10-27 DIAGNOSIS — Z79899 Other long term (current) drug therapy: Secondary | ICD-10-CM | POA: Insufficient documentation

## 2020-10-27 DIAGNOSIS — E1165 Type 2 diabetes mellitus with hyperglycemia: Secondary | ICD-10-CM | POA: Diagnosis not present

## 2020-10-27 DIAGNOSIS — D519 Vitamin B12 deficiency anemia, unspecified: Secondary | ICD-10-CM | POA: Diagnosis not present

## 2020-10-27 DIAGNOSIS — E559 Vitamin D deficiency, unspecified: Secondary | ICD-10-CM | POA: Diagnosis not present

## 2020-10-27 DIAGNOSIS — E785 Hyperlipidemia, unspecified: Secondary | ICD-10-CM | POA: Diagnosis not present

## 2020-10-27 HISTORY — DX: Malignant (primary) neoplasm, unspecified: C80.1

## 2020-10-27 HISTORY — DX: Chronic kidney disease, unspecified: N18.9

## 2020-10-27 LAB — BASIC METABOLIC PANEL
Anion gap: 5 (ref 5–15)
BUN: 21 mg/dL (ref 8–23)
CO2: 27 mmol/L (ref 22–32)
Calcium: 9.6 mg/dL (ref 8.9–10.3)
Chloride: 105 mmol/L (ref 98–111)
Creatinine, Ser: 0.86 mg/dL (ref 0.61–1.24)
GFR, Estimated: >60 mL/min (ref >60)
Glucose, Bld: 81 mg/dL (ref 70–99)
Potassium: 4.6 mmol/L (ref 3.5–5.1)
Sodium: 137 mmol/L (ref 135–145)

## 2020-10-27 LAB — HEMOGLOBIN A1C
Hgb A1c MFr Bld: 5.9 % — ABNORMAL HIGH (ref 4.8–5.6)
Mean Plasma Glucose: 122.63 mg/dL

## 2020-10-27 LAB — CBC
HCT: 40.4 % (ref 39.0–52.0)
Hemoglobin: 13.2 g/dL (ref 13.0–17.0)
MCH: 32 pg (ref 26.0–34.0)
MCHC: 32.7 g/dL (ref 30.0–36.0)
MCV: 98.1 fL (ref 80.0–100.0)
Platelets: 206 10*3/uL (ref 150–400)
RBC: 4.12 MIL/uL — ABNORMAL LOW (ref 4.22–5.81)
RDW: 13.2 % (ref 11.5–15.5)
WBC: 6.2 10*3/uL (ref 4.0–10.5)
nRBC: 0 % (ref 0.0–0.2)

## 2020-10-27 LAB — GLUCOSE, CAPILLARY: Glucose-Capillary: 71 mg/dL (ref 70–99)

## 2020-10-27 NOTE — Progress Notes (Signed)
COVID Vaccine Completed: NO Date COVID Vaccine completed: COVID vaccine manufacturer: [employer] Test: 11/11/20  PCP - Dr. [name] Cardiologist - Dr. Jenne Campus. LOV: 08/07/20  Chest x-ray -  EKG - 05/27/20 Stress Test -  ECHO -  Cardiac Cath -  Pacemaker/ICD device last checked:  Sleep Study - Yes CPAP - NO  Fasting Blood Sugar - 100's Checks Blood Sugar ___2__ times a week  Blood Thinner Instructions: Aspirin Instructions: Last Dose:  Anesthesia review: Hx: DIA,HTN,Chest pain  Patient denies shortness of breath, fever, cough and chest pain at PAT appointment   Patient verbalized understanding of instructions that were given to them at the PAT appointment. Patient was also instructed that they will need to review over the PAT instructions again at home before surgery.

## 2020-10-28 DIAGNOSIS — M961 Postlaminectomy syndrome, not elsewhere classified: Secondary | ICD-10-CM | POA: Diagnosis not present

## 2020-10-28 DIAGNOSIS — G894 Chronic pain syndrome: Secondary | ICD-10-CM | POA: Diagnosis not present

## 2020-10-29 DIAGNOSIS — I5032 Chronic diastolic (congestive) heart failure: Secondary | ICD-10-CM | POA: Diagnosis not present

## 2020-10-29 DIAGNOSIS — I110 Hypertensive heart disease with heart failure: Secondary | ICD-10-CM | POA: Diagnosis not present

## 2020-10-29 DIAGNOSIS — C652 Malignant neoplasm of left renal pelvis: Secondary | ICD-10-CM | POA: Diagnosis not present

## 2020-10-29 DIAGNOSIS — K2960 Other gastritis without bleeding: Secondary | ICD-10-CM | POA: Diagnosis not present

## 2020-10-29 DIAGNOSIS — C678 Malignant neoplasm of overlapping sites of bladder: Secondary | ICD-10-CM | POA: Diagnosis not present

## 2020-10-29 DIAGNOSIS — D500 Iron deficiency anemia secondary to blood loss (chronic): Secondary | ICD-10-CM | POA: Diagnosis not present

## 2020-10-29 DIAGNOSIS — E782 Mixed hyperlipidemia: Secondary | ICD-10-CM | POA: Diagnosis not present

## 2020-10-29 DIAGNOSIS — E1165 Type 2 diabetes mellitus with hyperglycemia: Secondary | ICD-10-CM | POA: Diagnosis not present

## 2020-10-29 DIAGNOSIS — I201 Angina pectoris with documented spasm: Secondary | ICD-10-CM | POA: Diagnosis not present

## 2020-10-29 DIAGNOSIS — I25119 Atherosclerotic heart disease of native coronary artery with unspecified angina pectoris: Secondary | ICD-10-CM | POA: Diagnosis not present

## 2020-10-29 NOTE — Progress Notes (Signed)
Anesthesia Chart Review:   Case: [id number] Date/Time: 11/13/20 1145   Procedures:      XI ROBOT ASSITED LAPAROSCOPIC NEPHROURETERECTOMY (Left) - 3 HRS     CYSTOSCOPY WITH RETROGRADE PYELOGRAM/URETERAL STENT EXCHANGE (Left)   Anesthesia type: Choice   Pre-op diagnosis: LEFT RENAL PELVIS CANCER   [facility] 05 [facility]   Surgeons: Alexis Frock, MD       DISCUSSION: Pt is 70 years old with hx HTN, DM, OSA  Underwent TURBT 08/14/20 without anesthesia complications  VS: BP 016/01   Pulse 79   Temp 36.5 C (Oral)   Ht 5\' 6"  (1.676 m)   Wt 96.2 kg   SpO2 96%   BMI 34.22 kg/m   PROVIDERS: - PCP is Street, [name], MD - Cardiologist is Jenne Campus, MD. Cleared for 08/14/20 TURBT at last office visit 08/07/20  LABS: Labs reviewed: Acceptable for surgery. (all labs ordered are listed, but only abnormal results are displayed)  Labs Reviewed  CBC - Abnormal; Notable for the following components:      Result Value   RBC 4.12 (*)    All other components within normal limits  HEMOGLOBIN A1C - Abnormal; Notable for the following components:   Hgb A1c MFr Bld 5.9 (*)    All other components within normal limits  BASIC METABOLIC PANEL  GLUCOSE, CAPILLARY    EKG 05/27/20: NSR   CV: Stress Test 11/27/2019 The left ventricular ejection fraction is normal (55-65%). Nuclear stress EF: 64%. There was no ST segment deviation noted during stress. No T wave inversion was noted during stress. Defect 1: There is a small defect of mild severity present in the basal inferior location. Findings consistent with ischemia. This is a low risk study. (Note: medical therapy elected for basal inferior defect)   Past Medical History:  Diagnosis Date   Abnormal stress test 12/03/2019   Acute pain 12/27/2018   Arthropathy of cervical facet joint 12/14/2016   Bilateral occipital neuralgia 10/13/2016   Body mass index 34.0-34.9, adult 09/02/2013   BPPV (benign paroxysmal  positional vertigo)    Cancer (HCC)    Cervical pseudoarthrosis, sequela 02/07/2017   Cervical spinal stenosis 10/13/2016   Chronic kidney disease    Chronic pain syndrome 09/32/3557   Diastolic dysfunction 32/20/2542   Duodenal ulcer    Non-bleeding on EGD, March 2021   Dyslipidemia 11/18/2019   Encounter for therapeutic drug monitoring 10/13/2016   Essential hypertension 11/18/2019   Hiatal hernia    High cholesterol    History of COVID-19 06/2019   History of kidney stones    Hypertension    Postlaminectomy syndrome of cervical region 10/13/2016   Precordial chest pain 11/18/2019   Presence of right artificial knee joint 10/13/2016   Trochanteric bursitis of right hip 10/13/2016   Type 2 diabetes mellitus with complication, without long-term current use of insulin ([place]) 11/18/2019   Type 2 diabetes mellitus with hyperglycemia, without long-term current use of insulin ([place])    Vitamin D deficiency     Past Surgical History:  Procedure Laterality Date   BLADDER TUMOR EXCISION     CERVICAL FUSION     C3-C4 1992 and C5-C6 2001.   CERVICAL FUSION     x 2vc   COLONOSCOPY     CYSTOSCOPY/RETROGRADE/URETEROSCOPY Bilateral 09/25/2020   Procedure: CYSTOSCOPY/BILATERAL RETROGRADE/ RIGHT DIAGNOSTIC URETEROSCOPY/ LEFT URETERSOSCOPY WITH BIOPSY AND STENT;  Surgeon: Alexis Frock, MD;  [facility];  Service: Urology;  Laterality: Bilateral;  CYSTOSCOPY/URETEROSCOPY/HOLMIUM LASER/STENT PLACEMENT Left 11/22/2018   Procedure: CYSTOSCOPY LEFT URETEROSCOPY/HOLMIUM LASER/STENT EXCHANGE;  Surgeon: Irine Seal, MD;  Location[facility];  Service: Urology;  Laterality: Left;   CYSTOSCOPY/URETEROSCOPY/HOLMIUM LASER/STENT PLACEMENT Left 12/25/2018   Procedure: CYSTOSCOPY LEFT RETROGRADE PYELOGRAM LEFT URETEROSCOPY WITH HOLMIUM LASER LEFT STENT EXCHANGE;  Surgeon: Irine Seal, MD;  Location[facility];  Service: Urology;  Laterality: Left;   KNEE ARTHROPLASTY  Right    KNEE ARTHROSCOPY Bilateral 1985   left knee arthroscopy      LITHOTRIPSY     right kne e replacement      TONSILLECTOMY     TRANSURETHRAL RESECTION OF BLADDER TUMOR N/A 09/25/2020   Procedure: RESTAGING TRANSURETHRAL RESECTION OF BLADDER TUMOR (TURBT);  Surgeon: Alexis Frock, MD;  [facility];  Service: Urology;  Laterality: N/A;   TRANSURETHRAL RESECTION OF BLADDER TUMOR WITH MITOMYCIN-C Bilateral 08/17/2020   Procedure: TRANSURETHRAL RESECTION OF BLADDER TUMOR , CYSTOSCOPY BILATERAL RETROGRADES;  Surgeon: Irine Seal, MD;  [facility];  Service: Urology;  Laterality: Bilateral;   UPPER GI ENDOSCOPY      MEDICATIONS:  acetaminophen (TYLENOL) 325 MG tablet   amLODipine (NORVASC) 2.5 MG tablet   atorvastatin (LIPITOR) 20 MG tablet   buprenorphine (SUBUTEX) 2 MG SUBL SL tablet   glipiZIDE (GLUCOTROL XL) 10 MG 24 hr tablet   ketorolac (TORADOL) 10 MG tablet   metFORMIN (GLUCOPHAGE) 1000 MG tablet   mupirocin ointment (BACTROBAN) 2 %   nitroGLYCERIN (NITROSTAT) 0.4 MG SL tablet   olmesartan (BENICAR) 40 MG tablet   omeprazole (PRILOSEC) 40 MG capsule   ondansetron (ZOFRAN) 8 MG tablet   OVER THE COUNTER MEDICATION   oxybutynin (DITROPAN) 5 MG tablet   oxyCODONE-acetaminophen (PERCOCET) 5-325 MG tablet   ranolazine (RANEXA) 500 MG 12 hr tablet   senna-docusate (SENOKOT-S) 8.6-50 MG tablet   traMADol (ULTRAM) 50 MG tablet   Vitamin D, Ergocalciferol, (DRISDOL) 1.25 MG (50000 UNIT) CAPS capsule   zolpidem (AMBIEN) 10 MG tablet   No current facility-administered medications for this encounter.    gemcitabine (GEMZAR) chemo syringe for bladder instillation 2,000 mg    If no changes, I anticipate pt can proceed with surgery as scheduled.   Willeen Cass, PhD, FNP-BC [facility]/Anesthesiology Phone: [phone] 10/29/2020 1:39 PM

## 2020-10-29 NOTE — Anesthesia Preprocedure Evaluation (Addendum)
Anesthesia Evaluation  Patient identified by MRN, date of birth, ID band Patient awake    Reviewed: Allergy & Precautions, NPO status , Patient's Chart, lab work & pertinent test results  Airway Mallampati: I  TM Distance: >3 FB Neck ROM: Full    Dental  (+) Teeth Intact, Dental Advisory Given   Pulmonary sleep apnea , former smoker,    breath sounds clear to auscultation       Cardiovascular hypertension, Pt. on medications + CAD   Rhythm:Regular Rate:Normal     Neuro/Psych PSYCHIATRIC DISORDERS Anxiety Depression    GI/Hepatic Neg liver ROS, hiatal hernia, PUD, GERD  Medicated,  Endo/Other  diabetes, Type 2, Oral Hypoglycemic Agents  Renal/GU CRFRenal disease     Musculoskeletal  (+) Arthritis ,   Abdominal Normal abdominal exam  (+)   Peds  Hematology negative hematology ROS (+)   Anesthesia Other Findings   Reproductive/Obstetrics                           Anesthesia Physical Anesthesia Plan  ASA: 2  Anesthesia Plan: General   Post-op Pain Management:    Induction: Intravenous  PONV Risk Score and Plan: 3 and Ondansetron, Dexamethasone and Midazolam  Airway Management Planned: Oral ETT  Additional Equipment: None  Intra-op Plan:   Post-operative Plan: Extubation in OR  Informed Consent: I have reviewed the patients History and Physical, chart, labs and discussed the procedure including the risks, benefits and alternatives for the proposed anesthesia with the patient or authorized representative who has indicated his/her understanding and acceptance.     Dental advisory given  Plan Discussed with: CRNA  Anesthesia Plan Comments: (See APP note by Durel Salts, FNP  - 2 IV's )      Anesthesia Quick Evaluation

## 2020-11-10 DIAGNOSIS — Z905 Acquired absence of kidney: Secondary | ICD-10-CM

## 2020-11-10 DIAGNOSIS — Z8553 Personal history of malignant neoplasm of renal pelvis: Secondary | ICD-10-CM

## 2020-11-10 HISTORY — DX: Acquired absence of kidney: Z90.5

## 2020-11-10 HISTORY — DX: Personal history of malignant neoplasm of renal pelvis: Z85.53

## 2020-11-11 ENCOUNTER — Other Ambulatory Visit: Payer: Self-pay | Admitting: Urology

## 2020-11-11 LAB — SARS CORONAVIRUS 2 (TAT 6-24 HRS): SARS Coronavirus 2: NEGATIVE

## 2020-11-13 ENCOUNTER — Encounter (HOSPITAL_COMMUNITY)
Admission: RE | Disposition: A | Discharge status: Home / Self Care | Payer: Self-pay | Source: Ambulatory Visit | Attending: Urology

## 2020-11-13 ENCOUNTER — Encounter (HOSPITAL_COMMUNITY): Payer: Self-pay | Admitting: Urology

## 2020-11-13 ENCOUNTER — Inpatient Hospital Stay (HOSPITAL_COMMUNITY): Payer: Medicare Other | Admitting: Anesthesiology

## 2020-11-13 ENCOUNTER — Other Ambulatory Visit: Payer: Self-pay

## 2020-11-13 ENCOUNTER — Inpatient Hospital Stay (HOSPITAL_COMMUNITY): Payer: Medicare Other | Admitting: Emergency Medicine

## 2020-11-13 ENCOUNTER — Inpatient Hospital Stay (HOSPITAL_COMMUNITY)
Admission: RE | Admit: 2020-11-13 | Discharge: 2020-11-15 | DRG: 658 | Disposition: A | Discharge status: Home / Self Care | Payer: Medicare Other | Source: Ambulatory Visit | Attending: Urology | Admitting: Urology

## 2020-11-13 DIAGNOSIS — Z8261 Family history of arthritis: Secondary | ICD-10-CM

## 2020-11-13 DIAGNOSIS — Z8616 Personal history of COVID-19: Secondary | ICD-10-CM | POA: Diagnosis not present

## 2020-11-13 DIAGNOSIS — N3289 Other specified disorders of bladder: Secondary | ICD-10-CM | POA: Diagnosis present

## 2020-11-13 DIAGNOSIS — M1990 Unspecified osteoarthritis, unspecified site: Secondary | ICD-10-CM | POA: Diagnosis present

## 2020-11-13 DIAGNOSIS — G894 Chronic pain syndrome: Secondary | ICD-10-CM | POA: Diagnosis not present

## 2020-11-13 DIAGNOSIS — C642 Malignant neoplasm of left kidney, except renal pelvis: Secondary | ICD-10-CM | POA: Diagnosis present

## 2020-11-13 DIAGNOSIS — C652 Malignant neoplasm of left renal pelvis: Principal | ICD-10-CM | POA: Diagnosis present

## 2020-11-13 DIAGNOSIS — G4733 Obstructive sleep apnea (adult) (pediatric): Secondary | ICD-10-CM | POA: Diagnosis present

## 2020-11-13 DIAGNOSIS — Z87891 Personal history of nicotine dependence: Secondary | ICD-10-CM | POA: Diagnosis not present

## 2020-11-13 DIAGNOSIS — Z20822 Contact with and (suspected) exposure to covid-19: Secondary | ICD-10-CM | POA: Diagnosis not present

## 2020-11-13 DIAGNOSIS — Z981 Arthrodesis status: Secondary | ICD-10-CM | POA: Diagnosis not present

## 2020-11-13 DIAGNOSIS — Z87442 Personal history of urinary calculi: Secondary | ICD-10-CM

## 2020-11-13 DIAGNOSIS — E669 Obesity, unspecified: Secondary | ICD-10-CM | POA: Diagnosis present

## 2020-11-13 DIAGNOSIS — I2510 Atherosclerotic heart disease of native coronary artery without angina pectoris: Secondary | ICD-10-CM | POA: Diagnosis not present

## 2020-11-13 DIAGNOSIS — Z6833 Body mass index (BMI) 33.0-33.9, adult: Secondary | ICD-10-CM

## 2020-11-13 DIAGNOSIS — Z8249 Family history of ischemic heart disease and other diseases of the circulatory system: Secondary | ICD-10-CM

## 2020-11-13 DIAGNOSIS — Z01818 Encounter for other preprocedural examination: Secondary | ICD-10-CM

## 2020-11-13 DIAGNOSIS — I10 Essential (primary) hypertension: Secondary | ICD-10-CM | POA: Diagnosis present

## 2020-11-13 DIAGNOSIS — Z8551 Personal history of malignant neoplasm of bladder: Secondary | ICD-10-CM | POA: Diagnosis not present

## 2020-11-13 DIAGNOSIS — E7800 Pure hypercholesterolemia, unspecified: Secondary | ICD-10-CM | POA: Diagnosis not present

## 2020-11-13 DIAGNOSIS — E1122 Type 2 diabetes mellitus with diabetic chronic kidney disease: Secondary | ICD-10-CM | POA: Diagnosis not present

## 2020-11-13 DIAGNOSIS — C679 Malignant neoplasm of bladder, unspecified: Secondary | ICD-10-CM | POA: Diagnosis not present

## 2020-11-13 DIAGNOSIS — Z8711 Personal history of peptic ulcer disease: Secondary | ICD-10-CM

## 2020-11-13 HISTORY — DX: Malignant neoplasm of left kidney, except renal pelvis: C64.2

## 2020-11-13 HISTORY — PX: ROBOT ASSITED LAPAROSCOPIC NEPHROURETERECTOMY: SHX6077

## 2020-11-13 HISTORY — PX: CYSTOSCOPY: SHX5120

## 2020-11-13 LAB — BASIC METABOLIC PANEL
Anion gap: 10 (ref 5–15)
BUN: 22 mg/dL (ref 8–23)
CO2: 22 mmol/L (ref 22–32)
Calcium: 8.9 mg/dL (ref 8.9–10.3)
Chloride: 103 mmol/L (ref 98–111)
Creatinine, Ser: 1.13 mg/dL (ref 0.61–1.24)
GFR, Estimated: >60 mL/min (ref >60)
Glucose, Bld: 198 mg/dL — ABNORMAL HIGH (ref 70–99)
Potassium: 4.9 mmol/L (ref 3.5–5.1)
Sodium: 135 mmol/L (ref 135–145)

## 2020-11-13 LAB — HEMOGLOBIN AND HEMATOCRIT, BLOOD
HCT: 37.1 % — ABNORMAL LOW (ref 39.0–52.0)
Hemoglobin: 12.1 g/dL — ABNORMAL LOW (ref 13.0–17.0)

## 2020-11-13 LAB — GLUCOSE, CAPILLARY
Glucose-Capillary: 131 mg/dL — ABNORMAL HIGH (ref 70–99)
Glucose-Capillary: 188 mg/dL — ABNORMAL HIGH (ref 70–99)
Glucose-Capillary: 173 mg/dL — ABNORMAL HIGH (ref 70–99)

## 2020-11-13 LAB — TYPE AND SCREEN
ABO/RH(D): O POS
Antibody Screen: NEGATIVE

## 2020-11-13 LAB — ABO/RH: ABO/RH(D): O POS

## 2020-11-13 SURGERY — NEPHROURETERECTOMY, ROBOT-ASSISTED, LAPAROSCOPIC
Anesthesia: General

## 2020-11-13 MED ORDER — ACETAMINOPHEN 10 MG/ML IV SOLN: 1000.0000 mg | Freq: Once | INTRAVENOUS | Status: DC | PRN

## 2020-11-13 MED ORDER — AMISULPRIDE (ANTIEMETIC) 5 MG/2ML IV SOLN
INTRAVENOUS | Status: AC
  Filled 2020-11-13: qty 4

## 2020-11-13 MED ORDER — ACETAMINOPHEN 10 MG/ML IV SOLN
INTRAVENOUS | Status: DC | PRN
  Administered 2020-11-13: 1000 mg via INTRAVENOUS

## 2020-11-13 MED ORDER — KETAMINE HCL 10 MG/ML IJ SOLN
INTRAMUSCULAR | Status: DC | PRN
  Administered 2020-11-13: 30 mg via INTRAVENOUS

## 2020-11-13 MED ORDER — ALBUMIN HUMAN 5 % IV SOLN: INTRAVENOUS | Status: DC | PRN

## 2020-11-13 MED ORDER — SODIUM CHLORIDE (PF) 0.9 % IJ SOLN
INTRAMUSCULAR | Status: DC | PRN
  Administered 2020-11-13: 10 mL

## 2020-11-13 MED ORDER — HYDROMORPHONE HCL 1 MG/ML IJ SOLN
0.2500 mg | INTRAMUSCULAR | Status: DC | PRN
  Administered 2020-11-13 (×3): 0.5 mg via INTRAVENOUS

## 2020-11-13 MED ORDER — GLIPIZIDE ER 10 MG PO TB24
10.0000 mg | ORAL_TABLET | Freq: Every day | ORAL | Status: DC
  Administered 2020-11-14 – 2020-11-15 (×2): 10 mg via ORAL
  Filled 2020-11-13 (×2): qty 1

## 2020-11-13 MED ORDER — OXYBUTYNIN CHLORIDE 5 MG PO TABS
5.0000 mg | ORAL_TABLET | Freq: Three times a day (TID) | ORAL | Status: DC | PRN
  Administered 2020-11-14: 5 mg via ORAL
  Filled 2020-11-13: qty 1

## 2020-11-13 MED ORDER — MIDAZOLAM HCL 2 MG/2ML IJ SOLN
INTRAMUSCULAR | Status: AC
  Filled 2020-11-13: qty 2

## 2020-11-13 MED ORDER — AMISULPRIDE (ANTIEMETIC) 5 MG/2ML IV SOLN
10.0000 mg | Freq: Once | INTRAVENOUS | Status: AC | PRN
  Administered 2020-11-13: 10 mg via INTRAVENOUS

## 2020-11-13 MED ORDER — PANTOPRAZOLE SODIUM 40 MG PO TBEC
40.0000 mg | DELAYED_RELEASE_TABLET | Freq: Every day | ORAL | Status: DC
  Administered 2020-11-14 – 2020-11-15 (×2): 40 mg via ORAL
  Filled 2020-11-13 (×2): qty 1

## 2020-11-13 MED ORDER — BUPIVACAINE LIPOSOME 1.3 % IJ SUSP
INTRAMUSCULAR | Status: AC
  Filled 2020-11-13: qty 20

## 2020-11-13 MED ORDER — SODIUM CHLORIDE 0.9 % IV SOLN: INTRAVENOUS | Status: DC

## 2020-11-13 MED ORDER — CHLORHEXIDINE GLUCONATE CLOTH 2 % EX PADS
6.0000 | MEDICATED_PAD | Freq: Every day | CUTANEOUS | Status: DC
  Administered 2020-11-14 – 2020-11-15 (×2): 6 via TOPICAL

## 2020-11-13 MED ORDER — LIDOCAINE 20MG/ML (2%) 15 ML SYRINGE OPTIME
INTRAMUSCULAR | Status: DC | PRN
  Administered 2020-11-13: 1.5 mg/kg/h via INTRAVENOUS

## 2020-11-13 MED ORDER — LIDOCAINE HCL 2 % IJ SOLN
INTRAMUSCULAR | Status: AC
  Filled 2020-11-13: qty 20

## 2020-11-13 MED ORDER — PROPOFOL 10 MG/ML IV BOLUS
INTRAVENOUS | Status: AC
  Filled 2020-11-13: qty 20

## 2020-11-13 MED ORDER — AMLODIPINE BESYLATE 2.5 MG PO TABS
2.5000 mg | ORAL_TABLET | Freq: Every day | ORAL | Status: DC
  Administered 2020-11-14 – 2020-11-15 (×2): 2.5 mg via ORAL
  Filled 2020-11-13 (×2): qty 1

## 2020-11-13 MED ORDER — MIDAZOLAM HCL 5 MG/5ML IJ SOLN
INTRAMUSCULAR | Status: DC | PRN
  Administered 2020-11-13: 2 mg via INTRAVENOUS

## 2020-11-13 MED ORDER — LIDOCAINE 2% (20 MG/ML) 5 ML SYRINGE
INTRAMUSCULAR | Status: DC | PRN
  Administered 2020-11-13: 80 mg via INTRAVENOUS

## 2020-11-13 MED ORDER — ATORVASTATIN CALCIUM 20 MG PO TABS
20.0000 mg | ORAL_TABLET | Freq: Every day | ORAL | Status: DC
  Administered 2020-11-14 – 2020-11-15 (×2): 20 mg via ORAL
  Filled 2020-11-13 (×2): qty 1

## 2020-11-13 MED ORDER — ONDANSETRON HCL 4 MG/2ML IJ SOLN
INTRAMUSCULAR | Status: DC | PRN
  Administered 2020-11-13: 4 mg via INTRAVENOUS

## 2020-11-13 MED ORDER — PROMETHAZINE HCL 25 MG/ML IJ SOLN: 6.2500 mg | INTRAMUSCULAR | Status: DC | PRN

## 2020-11-13 MED ORDER — DEXMEDETOMIDINE (PRECEDEX) IN NS 20 MCG/5ML (4 MCG/ML) IV SYRINGE
PREFILLED_SYRINGE | INTRAVENOUS | Status: DC | PRN
  Administered 2020-11-13 (×5): 4 ug via INTRAVENOUS

## 2020-11-13 MED ORDER — LACTATED RINGERS IV SOLN: INTRAVENOUS | Status: DC

## 2020-11-13 MED ORDER — ACETAMINOPHEN 10 MG/ML IV SOLN
INTRAVENOUS | Status: AC
  Filled 2020-11-13: qty 100

## 2020-11-13 MED ORDER — FENTANYL CITRATE (PF) 250 MCG/5ML IJ SOLN
INTRAMUSCULAR | Status: AC
  Filled 2020-11-13: qty 5

## 2020-11-13 MED ORDER — ACETAMINOPHEN 500 MG PO TABS
1000.0000 mg | ORAL_TABLET | Freq: Four times a day (QID) | ORAL | Status: AC
  Administered 2020-11-13 – 2020-11-14 (×4): 1000 mg via ORAL
  Filled 2020-11-13 (×4): qty 2

## 2020-11-13 MED ORDER — EPHEDRINE SULFATE-NACL 50-0.9 MG/10ML-% IV SOSY
PREFILLED_SYRINGE | INTRAVENOUS | Status: DC | PRN
  Administered 2020-11-13: 10 mg via INTRAVENOUS

## 2020-11-13 MED ORDER — LACTATED RINGERS IR SOLN
Status: DC | PRN
  Administered 2020-11-13: 1000 mL

## 2020-11-13 MED ORDER — MEPERIDINE HCL 50 MG/ML IJ SOLN: 6.2500 mg | INTRAMUSCULAR | Status: DC | PRN

## 2020-11-13 MED ORDER — PROMETHAZINE HCL 25 MG/ML IJ SOLN
INTRAMUSCULAR | Status: AC
  Administered 2020-11-13: 12.5 mg via INTRAVENOUS
  Filled 2020-11-13: qty 1

## 2020-11-13 MED ORDER — SENNOSIDES-DOCUSATE SODIUM 8.6-50 MG PO TABS
2.0000 | ORAL_TABLET | Freq: Every day | ORAL | Status: DC
  Administered 2020-11-13 – 2020-11-14 (×2): 2 via ORAL
  Filled 2020-11-13 (×2): qty 2

## 2020-11-13 MED ORDER — HYDROMORPHONE HCL 1 MG/ML IJ SOLN
0.5000 mg | INTRAMUSCULAR | Status: DC | PRN
  Administered 2020-11-13 – 2020-11-15 (×15): 1 mg via INTRAVENOUS
  Filled 2020-11-13 (×16): qty 1

## 2020-11-13 MED ORDER — ONDANSETRON HCL 4 MG/2ML IJ SOLN
INTRAMUSCULAR | Status: AC
  Filled 2020-11-13: qty 2

## 2020-11-13 MED ORDER — DEXAMETHASONE SODIUM PHOSPHATE 10 MG/ML IJ SOLN
INTRAMUSCULAR | Status: DC | PRN
  Administered 2020-11-13: 10 mg via INTRAVENOUS

## 2020-11-13 MED ORDER — PHENYLEPHRINE 40 MCG/ML (10ML) SYRINGE FOR IV PUSH (FOR BLOOD PRESSURE SUPPORT)
PREFILLED_SYRINGE | INTRAVENOUS | Status: DC | PRN
  Administered 2020-11-13 (×4): 80 ug via INTRAVENOUS

## 2020-11-13 MED ORDER — DEXAMETHASONE SODIUM PHOSPHATE 10 MG/ML IJ SOLN
INTRAMUSCULAR | Status: AC
  Filled 2020-11-13: qty 1

## 2020-11-13 MED ORDER — RANOLAZINE ER 500 MG PO TB12
500.0000 mg | ORAL_TABLET | Freq: Two times a day (BID) | ORAL | Status: DC
  Administered 2020-11-13 – 2020-11-15 (×4): 500 mg via ORAL
  Filled 2020-11-13 (×4): qty 1

## 2020-11-13 MED ORDER — ORAL CARE MOUTH RINSE: 15.0000 mL | Freq: Once | OROMUCOSAL | Status: AC

## 2020-11-13 MED ORDER — POLYETHYLENE GLYCOL 3350 17 GM/SCOOP PO POWD
1.0000 | Freq: Once | ORAL | Status: DC
  Administered 2020-11-13: 255 g via ORAL
  Filled 2020-11-13: qty 255

## 2020-11-13 MED ORDER — PROPOFOL 10 MG/ML IV BOLUS
INTRAVENOUS | Status: DC | PRN
  Administered 2020-11-13: 180 mg via INTRAVENOUS

## 2020-11-13 MED ORDER — ROCURONIUM BROMIDE 10 MG/ML (PF) SYRINGE
PREFILLED_SYRINGE | INTRAVENOUS | Status: DC | PRN
  Administered 2020-11-13 (×2): 10 mg via INTRAVENOUS
  Administered 2020-11-13: 80 mg via INTRAVENOUS

## 2020-11-13 MED ORDER — DOCUSATE SODIUM 100 MG PO CAPS
100.0000 mg | ORAL_CAPSULE | Freq: Two times a day (BID) | ORAL | Status: DC
  Administered 2020-11-13 – 2020-11-15 (×4): 100 mg via ORAL
  Filled 2020-11-13 (×4): qty 1

## 2020-11-13 MED ORDER — SODIUM CHLORIDE (PF) 0.9 % IJ SOLN
INTRAMUSCULAR | Status: AC
  Filled 2020-11-13: qty 20

## 2020-11-13 MED ORDER — ONDANSETRON HCL 4 MG/2ML IJ SOLN: 4.0000 mg | INTRAMUSCULAR | Status: DC | PRN

## 2020-11-13 MED ORDER — CEFAZOLIN SODIUM-DEXTROSE 2-4 GM/100ML-% IV SOLN
2.0000 g | INTRAVENOUS | Status: AC
  Administered 2020-11-13: 2 g via INTRAVENOUS
  Filled 2020-11-13: qty 100

## 2020-11-13 MED ORDER — CEFAZOLIN SODIUM-DEXTROSE 1-4 GM/50ML-% IV SOLN
1.0000 g | Freq: Three times a day (TID) | INTRAVENOUS | Status: AC
  Administered 2020-11-13 – 2020-11-14 (×2): 1 g via INTRAVENOUS
  Filled 2020-11-13 (×2): qty 50

## 2020-11-13 MED ORDER — ACETAMINOPHEN 160 MG/5ML PO SOLN: 325.0000 mg | Freq: Once | ORAL | Status: DC | PRN

## 2020-11-13 MED ORDER — CHLORHEXIDINE GLUCONATE 0.12 % MT SOLN
15.0000 mL | Freq: Once | OROMUCOSAL | Status: AC
  Administered 2020-11-13: 15 mL via OROMUCOSAL

## 2020-11-13 MED ORDER — BUPIVACAINE LIPOSOME 1.3 % IJ SUSP
INTRAMUSCULAR | Status: DC | PRN
  Administered 2020-11-13: 20 mL

## 2020-11-13 MED ORDER — ACETAMINOPHEN 325 MG PO TABS: 325.0000 mg | ORAL_TABLET | Freq: Once | ORAL | Status: DC | PRN

## 2020-11-13 MED ORDER — OXYCODONE HCL 5 MG PO TABS
5.0000 mg | ORAL_TABLET | ORAL | Status: DC | PRN
  Administered 2020-11-13 – 2020-11-15 (×8): 5 mg via ORAL
  Filled 2020-11-13 (×8): qty 1

## 2020-11-13 MED ORDER — STERILE WATER FOR IRRIGATION IR SOLN
Status: DC | PRN
  Administered 2020-11-13: 3000 mL

## 2020-11-13 MED ORDER — ROCURONIUM BROMIDE 10 MG/ML (PF) SYRINGE
PREFILLED_SYRINGE | INTRAVENOUS | Status: AC
  Filled 2020-11-13: qty 10

## 2020-11-13 MED ORDER — MEPERIDINE HCL 50 MG/ML IJ SOLN
INTRAMUSCULAR | Status: AC
  Administered 2020-11-13: 12.5 mg via INTRAVENOUS
  Filled 2020-11-13: qty 1

## 2020-11-13 MED ORDER — HYDROMORPHONE HCL 1 MG/ML IJ SOLN
INTRAMUSCULAR | Status: AC
  Administered 2020-11-13: 0.5 mg via INTRAVENOUS
  Filled 2020-11-13: qty 2

## 2020-11-13 MED ORDER — FENTANYL CITRATE (PF) 250 MCG/5ML IJ SOLN
INTRAMUSCULAR | Status: DC | PRN
  Administered 2020-11-13 (×3): 50 ug via INTRAVENOUS
  Administered 2020-11-13: 100 ug via INTRAVENOUS
  Administered 2020-11-13 (×2): 50 ug via INTRAVENOUS

## 2020-11-13 SURGICAL SUPPLY — 73 items
BAG COUNTER SPONGE SURGICOUNT (BAG) IMPLANT
BAG LAPAROSCOPIC 12 15 PORT 16 (BASKET) ×2 IMPLANT
BAG RETRIEVAL 12/15 (BASKET) ×3
BAG URO CATCHER STRL LF (MISCELLANEOUS) IMPLANT
BASKET ZERO TIP NITINOL 2.4FR (BASKET) IMPLANT
CATH FOLEY 3WAY  5CC 18FR (CATHETERS) ×1
CATH FOLEY 3WAY 5CC 18FR (CATHETERS) ×2 IMPLANT
CATH URETL OPEN END 6FR 70 (CATHETERS) IMPLANT
CHLORAPREP W/TINT 26 (MISCELLANEOUS) ×3 IMPLANT
CLIP LIGATING HEMO LOK XL GOLD (MISCELLANEOUS) ×3 IMPLANT
CLIP LIGATING HEMO O LOK GREEN (MISCELLANEOUS) ×6 IMPLANT
CLOTH BEACON ORANGE TIMEOUT ST (SAFETY) IMPLANT
COVER SURGICAL LIGHT HANDLE (MISCELLANEOUS) ×3 IMPLANT
COVER TIP SHEARS 8 DVNC (MISCELLANEOUS) ×2 IMPLANT
COVER TIP SHEARS 8MM DA VINCI (MISCELLANEOUS) ×1
CUTTER ECHEON FLEX ENDO 45 340 (ENDOMECHANICALS) ×3 IMPLANT
DECANTER SPIKE VIAL GLASS SM (MISCELLANEOUS) ×3 IMPLANT
DERMABOND ADVANCED (GAUZE/BANDAGES/DRESSINGS) ×2
DERMABOND ADVANCED .7 DNX12 (GAUZE/BANDAGES/DRESSINGS) ×4 IMPLANT
DRAIN CHANNEL 15F RND FF 3/16 (WOUND CARE) IMPLANT
DRAPE ARM DVNC X/XI (DISPOSABLE) ×8 IMPLANT
DRAPE COLUMN DVNC XI (DISPOSABLE) ×2 IMPLANT
DRAPE DA VINCI XI ARM (DISPOSABLE) ×4
DRAPE DA VINCI XI COLUMN (DISPOSABLE) ×1
DRAPE INCISE IOBAN 66X45 STRL (DRAPES) ×3 IMPLANT
DRAPE SHEET LG 3/4 BI-LAMINATE (DRAPES) ×3 IMPLANT
ELECT PENCIL ROCKER SW 15FT (MISCELLANEOUS) ×3 IMPLANT
ELECT REM PT RETURN 15FT ADLT (MISCELLANEOUS) ×3 IMPLANT
EVACUATOR SILICONE 100CC (DRAIN) IMPLANT
GAUZE 4X4 16PLY ~~LOC~~+RFID DBL (SPONGE) ×3 IMPLANT
GLOVE SURG ENC MOIS LTX SZ6.5 (GLOVE) ×3 IMPLANT
GLOVE SURG ENC TEXT LTX SZ7.5 (GLOVE) ×6 IMPLANT
GOWN STRL REUS W/TWL LRG LVL3 (GOWN DISPOSABLE) ×12 IMPLANT
GUIDEWIRE ANG ZIPWIRE 038X150 (WIRE) IMPLANT
GUIDEWIRE STR DUAL SENSOR (WIRE) IMPLANT
IRRIG SUCT STRYKERFLOW 2 WTIP (MISCELLANEOUS) ×3
IRRIGATION SUCT STRKRFLW 2 WTP (MISCELLANEOUS) ×2 IMPLANT
KIT BASIN OR (CUSTOM PROCEDURE TRAY) ×3 IMPLANT
KIT TURNOVER KIT A (KITS) IMPLANT
LOOP VESSEL MAXI BLUE (MISCELLANEOUS) ×3 IMPLANT
MANIFOLD NEPTUNE II (INSTRUMENTS) ×3 IMPLANT
MARKER SKIN DUAL TIP RULER LAB (MISCELLANEOUS) ×3 IMPLANT
NEEDLE INSUFFLATION 14GA 120MM (NEEDLE) ×3 IMPLANT
NS IRRIG 1000ML POUR BTL (IV SOLUTION) ×3 IMPLANT
PACK CYSTO (CUSTOM PROCEDURE TRAY) ×3 IMPLANT
PENCIL SMOKE EVACUATOR (MISCELLANEOUS) IMPLANT
PORT ACCESS TROCAR AIRSEAL 12 (TROCAR) ×2 IMPLANT
PORT ACCESS TROCAR AIRSEAL 5M (TROCAR) ×1
PROTECTOR NERVE ULNAR (MISCELLANEOUS) ×6 IMPLANT
SEAL CANN UNIV 5-8 DVNC XI (MISCELLANEOUS) ×8 IMPLANT
SEAL XI 5MM-8MM UNIVERSAL (MISCELLANEOUS) ×4
SET IRRIG Y TYPE TUR BLADDER L (SET/KITS/TRAYS/PACK) ×3 IMPLANT
SET TRI-LUMEN FLTR TB AIRSEAL (TUBING) ×3 IMPLANT
SOLUTION ELECTROLUBE (MISCELLANEOUS) ×3 IMPLANT
STAPLE RELOAD 45 WHT (STAPLE) ×8 IMPLANT
STAPLE RELOAD 45MM WHITE (STAPLE) ×4
SUT ETHILON 3 0 PS 1 (SUTURE) ×3 IMPLANT
SUT MNCRL AB 4-0 PS2 18 (SUTURE) ×6 IMPLANT
SUT PDS AB 1 CT1 27 (SUTURE) ×12 IMPLANT
SUT VIC AB 2-0 SH 27 (SUTURE) ×1
SUT VIC AB 2-0 SH 27X BRD (SUTURE) ×2 IMPLANT
SUT VLOC BARB 180 ABS3/0GR12 (SUTURE) ×3
SUTURE VLOC BRB 180 ABS3/0GR12 (SUTURE) ×2 IMPLANT
TOWEL OR 17X26 10 PK STRL BLUE (TOWEL DISPOSABLE) ×3 IMPLANT
TOWEL OR NON WOVEN STRL DISP B (DISPOSABLE) IMPLANT
TRAY FOLEY MTR SLVR 16FR STAT (SET/KITS/TRAYS/PACK) ×3 IMPLANT
TRAY LAPAROSCOPIC (CUSTOM PROCEDURE TRAY) ×3 IMPLANT
TROCAR BLADELESS OPT 5 100 (ENDOMECHANICALS) IMPLANT
TROCAR UNIVERSAL OPT 12M 100M (ENDOMECHANICALS) ×3 IMPLANT
TROCAR XCEL 12X100 BLDLESS (ENDOMECHANICALS) ×3 IMPLANT
TUBING CONNECTING 10 (TUBING) IMPLANT
TUBING UROLOGY SET (TUBING) IMPLANT
WATER STERILE IRR 1000ML POUR (IV SOLUTION) ×3 IMPLANT

## 2020-11-13 NOTE — Anesthesia Postprocedure Evaluation (Signed)
Anesthesia Post Note  Patient: Frank Ware  Procedure(s) Performed: XI ROBOT ASSITED LAPAROSCOPIC NEPHROURETERECTOMY (Left) CYSTOSCOPY     Patient location during evaluation: PACU Anesthesia Type: General Level of consciousness: awake and alert Pain management: pain level controlled Vital Signs Assessment: post-procedure vital signs reviewed and stable Respiratory status: spontaneous breathing, nonlabored ventilation, respiratory function stable and patient connected to nasal cannula oxygen Cardiovascular status: blood pressure returned to baseline and stable Postop Assessment: no apparent nausea or vomiting Anesthetic complications: no   No notable events documented.  Last Vitals:  Vitals:   11/13/20 1710 11/13/20 1721  BP: 139/81 139/81  Pulse: (!) 108 (!) 108  Resp: 20 20  Temp: 36.7 C 36.7 C  SpO2: 97%     Last Pain:  Vitals:   11/13/20 1755  TempSrc:   PainSc: [place]

## 2020-11-13 NOTE — Discharge Instructions (Signed)

## 2020-11-13 NOTE — Transfer of Care (Signed)
Immediate Anesthesia Transfer of Care Note  Patient: Frank Ware  Procedure(s) Performed: XI ROBOT ASSITED LAPAROSCOPIC NEPHROURETERECTOMY (Left) CYSTOSCOPY  Patient Location: PACU  Anesthesia Type:General  Level of Consciousness: awake, alert  and oriented  Airway & Oxygen Therapy: Patient Spontanous Breathing and Patient connected to face mask oxygen  Post-op Assessment: Report given to RN and Post -op Vital signs reviewed and stable  Post vital signs: Reviewed and stable  Last Vitals:  Vitals Value Taken Time  BP 141/89 11/13/20 1536  Temp    Pulse 106 11/13/20 1540  Resp 27 11/13/20 1540  SpO2 95 % 11/13/20 1540  Vitals shown include unvalidated device data.  Last Pain:  Vitals:   11/13/20 1014  TempSrc:   PainSc: 7          Complications: No notable events documented.

## 2020-11-13 NOTE — Anesthesia Procedure Notes (Signed)
Procedure Name: Intubation Date/Time: 11/13/2020 12:35 PM Performed by: Ruey Storer D, CRNA Pre-anesthesia Checklist: Patient identified, Emergency Drugs available, Suction available and Patient being monitored Patient Re-evaluated:Patient Re-evaluated prior to induction Oxygen Delivery Method: Circle system utilized Preoxygenation: Pre-oxygenation with 100% oxygen Induction Type: IV induction Ventilation: Mask ventilation without difficulty Laryngoscope Size: Mac and 4 Grade View: Grade II Tube type: Oral Tube size: 7.5 mm Number of attempts: 1 Airway Equipment and Method: Stylet Placement Confirmation: ETT inserted through vocal cords under direct vision, positive ETCO2 and breath sounds checked- equal and bilateral Secured at: 22 cm Tube secured with: Tape Dental Injury: Teeth and Oropharynx as per pre-operative assessment

## 2020-11-13 NOTE — H&P (Signed)
Frank Ware is an 70 y.o. male.    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 1 artery / 1 vein left renovascular anatomy. CT localized, Cr <1.5.  09/2020 - Restaging TURBT (no residual bladder cancer), large voluem left renal pelvix cancer G3, Rt kidney + bilateral ureters grossly negative.    PMH sig for obesity, DM2, HTN, Ware risk stress test (follows [facility] cards in [facility]). His PCP is Frank Che MD with [facility] in [place]. He is retired from [employer], retried in [place] to be clsoe to family. His wife Frank Ware is very involved.   Today "[name]" is seen to proceed with LEFT nephroureterectomy for large volume high grade left renal pelvis cancer. No interval fevers.   Past Medical History:  Diagnosis Date   Abnormal stress test 12/03/2019   Acute pain 12/27/2018   Arthropathy of cervical facet joint 12/14/2016   Bilateral occipital neuralgia 10/13/2016   Body mass index 34.0-34.9, adult 09/02/2013   BPPV (benign paroxysmal positional vertigo)    Cancer (HCC)    Cervical pseudoarthrosis, sequela 02/07/2017   Cervical spinal stenosis 10/13/2016   Chronic kidney disease    Chronic pain syndrome 01/75/1025   Diastolic dysfunction 85/27/7824   Duodenal ulcer    Non-bleeding on EGD, March 2021   Dyslipidemia 11/18/2019   Encounter for therapeutic drug monitoring 10/13/2016   Essential hypertension 11/18/2019   Hiatal hernia    High cholesterol    History of COVID-19 06/2019   History of kidney stones    Hypertension    Postlaminectomy syndrome of cervical region 10/13/2016   Precordial chest pain 11/18/2019   Presence of right artificial knee joint 10/13/2016   Trochanteric bursitis of right hip 10/13/2016   Type 2 diabetes  mellitus with complication, without long-term current use of insulin ([place]) 11/18/2019   Type 2 diabetes mellitus with hyperglycemia, without long-term current use of insulin ([place])    Vitamin D deficiency     Past Surgical History:  Procedure Laterality Date   BLADDER TUMOR EXCISION     CERVICAL FUSION     C3-C4 1992 and C5-C6 2001.   CERVICAL FUSION     x 2vc   COLONOSCOPY     CYSTOSCOPY/RETROGRADE/URETEROSCOPY Bilateral 09/25/2020   Procedure: CYSTOSCOPY/BILATERAL RETROGRADE/ RIGHT DIAGNOSTIC URETEROSCOPY/ LEFT URETERSOSCOPY WITH BIOPSY AND STENT;  Surgeon: Alexis Frock, MD;  [facility];  Service: Urology;  Laterality: Bilateral;   CYSTOSCOPY/URETEROSCOPY/HOLMIUM LASER/STENT PLACEMENT Left 11/22/2018   Procedure: CYSTOSCOPY LEFT URETEROSCOPY/HOLMIUM LASER/STENT EXCHANGE;  Surgeon: Irine Seal, MD;  Location: [facility];  Service: Urology;  Laterality: Left;   CYSTOSCOPY/URETEROSCOPY/HOLMIUM LASER/STENT PLACEMENT Left 12/25/2018   Procedure: CYSTOSCOPY LEFT RETROGRADE PYELOGRAM LEFT URETEROSCOPY WITH HOLMIUM LASER LEFT STENT EXCHANGE;  Surgeon: Irine Seal, MD;  Location: [facility];  Service: Urology;  Laterality: Left;   KNEE ARTHROPLASTY Right    KNEE ARTHROSCOPY Bilateral 1985   left knee arthroscopy      LITHOTRIPSY     right kne e replacement      TONSILLECTOMY     TRANSURETHRAL RESECTION OF BLADDER TUMOR N/A 09/25/2020   Procedure: RESTAGING TRANSURETHRAL RESECTION OF BLADDER TUMOR (TURBT);  Surgeon: Alexis Frock, MD;  [facility];  Service: Urology;  Laterality: N/A;  TRANSURETHRAL RESECTION OF BLADDER TUMOR WITH MITOMYCIN-C Bilateral 08/17/2020   Procedure: TRANSURETHRAL RESECTION OF BLADDER TUMOR , CYSTOSCOPY BILATERAL RETROGRADES;  Surgeon: Irine Seal, MD;  [facility];  Service: Urology;  Laterality: Bilateral;   UPPER GI ENDOSCOPY      Family History  Problem Relation Age of Onset   Lymphoma Brother    Pancreatic  cancer Brother    Diabetes Maternal Grandfather    Hypertension Maternal Grandfather    Arthritis Mother    Arthritis Sister    Heart attack Paternal Grandfather    Colon cancer Neg Hx    Colon polyps Neg Hx    Esophageal cancer Neg Hx    Rectal cancer Neg Hx    Stomach cancer Neg Hx    Social History:  reports that he quit smoking about 26 years ago. His smoking use included cigarettes. He has never used smokeless tobacco. He reports that he does not drink alcohol and does not use drugs.  Allergies: No Known Allergies  No medications prior to admission.    No results found for this or any previous visit (from the past 48 hour(s)). No results found.  Review of Systems  Constitutional:  Negative for chills and fever.  All other systems reviewed and are negative.  There were no vitals taken for this visit. Physical Exam Vitals reviewed.  HENT:     Mouth/Throat:     Mouth: Mucous membranes are moist.  Eyes:     Pupils: Pupils are equal, round, and reactive to light.  Cardiovascular:     Rate and Rhythm: Normal rate.  Pulmonary:     Effort: Pulmonary effort is normal.  Abdominal:     General: Abdomen is flat.  Genitourinary:    Comments: No CVAT at present.  Musculoskeletal:        General: Normal range of motion.     Cervical back: Normal range of motion.  Skin:    General: Skin is warm.  Neurological:     General: No focal deficit present.     Mental Status: He is alert.  Psychiatric:        Mood and Affect: Mood normal.     Assessment/Plan  Proceed as planned with LEFT nephroureterectomy, robotic. Risks, benefits, alternatives, expected peri-op course indlucing home with foley discussed previouslyi and reiterated today.   Alexis Frock, MD 11/13/2020, 5:47 AM

## 2020-11-13 NOTE — Brief Op Note (Signed)
11/13/2020  3:29 PM  PATIENT:  Frank Ware  70 y.o. male  PRE-OPERATIVE DIAGNOSIS:  LEFT RENAL PELVIS CANCER  POST-OPERATIVE DIAGNOSIS:  LEFT RENAL PELVIS CANCER  PROCEDURE:  Procedure(s) with comments: XI ROBOT ASSITED LAPAROSCOPIC NEPHROURETERECTOMY (Left) - 3 HRS CYSTOSCOPY (N/A)  SURGEON:  Surgeon(s) and Role:    Alexis Frock, MD - Primary  PHYSICIAN ASSISTANT:   ASSISTANTS: Rosine Door MD   ANESTHESIA:   local and general  EBL:  150 mL   BLOOD ADMINISTERED:none  DRAINS:  3 way foley to gravity (irrigation port plugged); JP to bulb    LOCAL MEDICATIONS USED:  MARCAINE     SPECIMEN:  Source of Specimen:  Left kidney + ureter  DISPOSITION OF SPECIMEN:  PATHOLOGY  COUNTS:  YES  TOURNIQUET:  * No tourniquets in log *  DICTATION: .Other Dictation: Dictation Number [id number]  PLAN OF CARE: Admit to inpatient   PATIENT DISPOSITION:  PACU - hemodynamically stable.   Delay start of Pharmacological VTE agent (>24hrs) due to surgical blood loss or risk of bleeding: yes

## 2020-11-14 ENCOUNTER — Encounter (HOSPITAL_COMMUNITY): Payer: Self-pay | Admitting: Urology

## 2020-11-14 LAB — HEMOGLOBIN AND HEMATOCRIT, BLOOD
HCT: 39.6 % (ref 39.0–52.0)
Hemoglobin: 13 g/dL (ref 13.0–17.0)

## 2020-11-14 LAB — GLUCOSE, CAPILLARY
Glucose-Capillary: 212 mg/dL — ABNORMAL HIGH (ref 70–99)
Glucose-Capillary: 221 mg/dL — ABNORMAL HIGH (ref 70–99)
Glucose-Capillary: 183 mg/dL — ABNORMAL HIGH (ref 70–99)
Glucose-Capillary: 144 mg/dL — ABNORMAL HIGH (ref 70–99)
Glucose-Capillary: 177 mg/dL — ABNORMAL HIGH (ref 70–99)

## 2020-11-14 LAB — BASIC METABOLIC PANEL
Anion gap: 11 (ref 5–15)
BUN: 19 mg/dL (ref 8–23)
CO2: 22 mmol/L (ref 22–32)
Calcium: 9.2 mg/dL (ref 8.9–10.3)
Chloride: 101 mmol/L (ref 98–111)
Creatinine, Ser: 1.18 mg/dL (ref 0.61–1.24)
GFR, Estimated: >60 mL/min (ref >60)
Glucose, Bld: 213 mg/dL — ABNORMAL HIGH (ref 70–99)
Potassium: 4.8 mmol/L (ref 3.5–5.1)
Sodium: 134 mmol/L — ABNORMAL LOW (ref 135–145)

## 2020-11-14 LAB — CREATININE, FLUID (PLEURAL, PERITONEAL, JP DRAINAGE): Creat, Fluid: 1.3 mg/dL

## 2020-11-14 MED ORDER — BELLADONNA ALKALOIDS-OPIUM 16.2-30 MG RE SUPP
1.0000 | Freq: Three times a day (TID) | RECTAL | Status: DC | PRN
  Administered 2020-11-14: 1 via RECTAL
  Filled 2020-11-14: qty 1

## 2020-11-14 MED ORDER — MIRABEGRON ER 25 MG PO TB24
25.0000 mg | ORAL_TABLET | Freq: Every day | ORAL | Status: DC
  Administered 2020-11-14 – 2020-11-15 (×2): 25 mg via ORAL
  Filled 2020-11-14 (×2): qty 1

## 2020-11-14 NOTE — Progress Notes (Signed)
   11/13/20 1952  Assess: MEWS Score  Temp 98.2 F (36.8 C)  BP 135/89  Pulse Rate (!) 110  Resp 10  SpO2 97 %  Assess: MEWS Score  MEWS Temp 0  MEWS Systolic 0  MEWS Pulse 1  MEWS RR 1  MEWS LOC 0  MEWS Score 2  MEWS Score Color Yellow  Assess: if the MEWS score is Yellow or Red  Were vital signs taken at a resting state? Yes  Focused Assessment No change from prior assessment  Does the patient meet 2 or more of the SIRS criteria? No  MEWS guidelines implemented *See Row Information* No, previously yellow, continue vital signs every 4 hours (mews wasn't previously started)  Treat  MEWS Interventions Administered scheduled meds/treatments  Take Vital Signs  Increase Vital Sign Frequency  Yellow: Q 2hr X 2 then Q 4hr X 2, if remains yellow, continue Q 4hrs  Escalate  MEWS: Escalate Yellow: discuss with charge nurse/RN and consider discussing with provider and RRT  Notify: Charge Nurse/RN  Name of Charge Nurse/RN Notified Pam, RN  Date Charge Nurse/RN Notified 11/13/20  Time Charge Nurse/RN Notified 1952  Assess: SIRS CRITERIA  SIRS Temperature  0  SIRS Pulse 1  SIRS Respirations  0  SIRS WBC 0  SIRS Score Sum  1

## 2020-11-14 NOTE — Discharge Summary (Signed)
Date of admission: 11/13/2020  Date of discharge: 11/15/2020  Admission diagnosis: Urothelial carcinoma   Discharge diagnosis: Urothelial carcinoma   Secondary diagnoses:  Patient Active Problem List   Diagnosis Date Noted   Urothelial carcinoma of kidney, left ([place]) 11/13/2020   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 08/18/2020   Bladder cancer ([place]) 08/17/2020   Coronary artery disease minimal abnormal stress test 08/07/2020   Type 2 diabetes mellitus with hyperglycemia, without long-term current use of insulin ([facility])    Abnormal stress test 12/03/2019   Vitamin D deficiency    OSA (obstructive sleep apnea)    Hypertension    History of kidney stones    High cholesterol    Hiatal hernia    Gastroesophageal reflux disease without esophagitis    Duodenal ulcer    BPPV (benign paroxysmal positional vertigo)    Precordial chest pain 11/18/2019   Essential hypertension 11/18/2019   Dyslipidemia 11/18/2019   Type 2 diabetes mellitus with complication, without long-term current use of insulin ([place]) 11/18/2019   Kidney stone 11/07/2019   Acute pain 12/27/2018   Cervical pseudoarthrosis, sequela 02/07/2017   Arthropathy of cervical facet joint 12/14/2016   Cervical spinal stenosis 10/13/2016   Chronic pain syndrome 10/13/2016   Medication management 10/13/2016   Postlaminectomy syndrome of cervical region 10/13/2016   Presence of right artificial knee joint 10/13/2016   Primary localized osteoarthrosis, lower leg 10/13/2016   Trochanteric bursitis of right hip 10/13/2016   Unilateral primary osteoarthritis, right knee 10/13/2016   Bilateral occipital neuralgia 10/13/2016   GAD (generalized anxiety disorder) 04/06/2015   Mild episode of recurrent major depressive disorder ([place]) 04/06/2015   Body mass index 34.0-34.9, adult 09/02/2013    Procedures performed: Procedure(s): XI ROBOT ASSITED LAPAROSCOPIC NEPHROURETERECTOMY CYSTOSCOPY  History and Physical: For full  details, please see admission history and physical. Briefly, Frank Ware is a 70 y.o. year old patient with history of large volume left renal pelvis cancer and high grade bladder cancer who presents for left robotic nephroureterectomy.   Hospital Course: Patient tolerated the procedure well.  He was then transferred to the floor after an uneventful PACU stay.  His hospital course was uncomplicated. His foley catheter was continued through discharge. Urine output was robust and remained clear throughout hospital stay. He did require one time flushing of his foley due to debris. His JP drain was negative for creatinine and was removed prior to discharge. On POD#2 he had met discharge criteria: was eating a regular diet, was up and ambulating independently,  pain was well controlled, was voiding via catheter, and was ready to for discharge.   Laboratory values:  Recent Labs    11/13/20 1637 11/14/20 0450 11/15/20 0504  HGB 12.1* 13.0 12.2*  HCT 37.1* 39.6 36.3*   Recent Labs    11/13/20 1637 11/14/20 0450 11/15/20 0504  NA 135 134* 134*  K 4.9 4.8 4.5  CL 103 101 99  CO2 _0 GLUCOSE 198* 213* 296*  BUN 22 19 24*  CREATININE 1.13 1.18 1.22  CALCIUM 8.9 9.2 9.3   No results for input(s): LABPT, INR in the last 72 hours. No results for input(s): LABURIN in the last 72 hours. Results for orders placed or performed in visit on 11/11/20  SARS Coronavirus 2 (TAT 6-24 hrs)     Status: None   Collection Time: 11/11/20 12:00 AM  Result Value Ref Range Status   SARS Coronavirus 2 RESULT: NEGATIVE  Final  Comment: RESULT: NEGATIVESARS-CoV-2 INTERPRETATION:A NEGATIVE  test result means that SARS-CoV-2 RNA was not present in the specimen above the limit of detection of this test. This does not preclude a possible SARS-CoV-2 infection and should not be used as the  sole basis for patient management decisions. Negative results must be combined with clinical observations, patient history,  and epidemiological information. Optimum specimen types and timing for peak viral levels during infections caused by SARS-CoV-2  have not been determined. Collection of multiple specimens or types of specimens may be necessary to detect virus. Improper specimen collection and handling, sequence variability under primers/probes, or organism present below the limit of detection may  lead to false negative results. Positive and negative predictive values of testing are highly dependent on prevalence. False negative test results are more likely when prevalence of disease is high.The expected result is NEGATIVE.Fact S heet for  Healthcare Providers: LocalChronicle.no Sheet for Patients: SalonLookup.es Reference Range - Negative     Disposition: Home  Discharge instruction: The patient was instructed to be ambulatory but told to refrain from heavy lifting, strenuous activity, or driving.   Discharge medications:  Allergies as of 11/15/2020   No Known Allergies      Medication List     TAKE these medications    acetaminophen 325 MG tablet Commonly known as: TYLENOL Take 650 mg by mouth every 6 (six) hours as needed for mild pain, fever or headache.   amLODipine 2.5 MG tablet Commonly known as: NORVASC Take 2.5 mg by mouth daily.   atorvastatin 20 MG tablet Commonly known as: LIPITOR Take 20 mg by mouth daily.   buprenorphine 2 MG Subl SL tablet Commonly known as: SUBUTEX Place 2 mg under the tongue every 12 (twelve) hours as needed (pain).   glipiZIDE 10 MG 24 hr tablet Commonly known as: GLUCOTROL XL Take 10 mg by mouth in the morning and at bedtime.   ketorolac 10 MG tablet Commonly known as: TORADOL Take 1 tablet (10 mg total) by mouth every 8 (eight) hours as needed for moderate pain (or stent discomfort post-operatively).   metFORMIN 1000 MG tablet Commonly known as: GLUCOPHAGE Take 1,000 mg by mouth 2 (two)  times daily with a meal.   mupirocin ointment 2 % Commonly known as: BACTROBAN Apply 1 application topically daily as needed for irritation.   nitroGLYCERIN 0.4 MG SL tablet Commonly known as: NITROSTAT Place 1 tablet (0.4 mg total) under the tongue every 5 (five) minutes as needed.   olmesartan 40 MG tablet Commonly known as: BENICAR Take 40 mg by mouth daily.   omeprazole 40 MG capsule Commonly known as: PRILOSEC Take 40 mg by mouth daily.   ondansetron 8 MG tablet Commonly known as: ZOFRAN Take 8 mg by mouth every 8 (eight) hours as needed for nausea or vomiting.   OVER THE COUNTER MEDICATION Apply 1 application topically daily as needed (neck pain). Joint Flex   oxybutynin 5 MG tablet Commonly known as: DITROPAN Take 5 mg by mouth 3 (three) times daily as needed for bladder spasms.   oxyCODONE-acetaminophen 5-325 MG tablet Commonly known as: Percocet Take 1 tablet by mouth every 6 (six) hours as needed for severe pain (post-operatively).   ranolazine 500 MG 12 hr tablet Commonly known as: RANEXA Take 1 tablet (500 mg total) by mouth 2 (two) times daily.   senna-docusate 8.6-50 MG tablet Commonly known as: Senokot-S Take 1 tablet by mouth 2 (two) times daily. While taking strongest pain meds to prevent constipation.  traMADol 50 MG tablet Commonly known as: ULTRAM Take 100 mg by mouth every 6 (six) hours as needed for moderate pain.   Vitamin D (Ergocalciferol) 1.25 MG (50000 UNIT) Caps capsule Commonly known as: DRISDOL Take 50,000 Units by mouth every Tuesday.   zolpidem 10 MG tablet Commonly known as: AMBIEN Take 10 mg by mouth at bedtime.        Followup:   Follow-up Information     Alexis Frock, MD Follow up on 11/24/2020.   Specialty: Urology Why: at 1:45 for XRay, pathology review, MD visit, and catheter removal Contact information: [place] [address] [phone]

## 2020-11-14 NOTE — Progress Notes (Signed)
1 Day Post-Op Subjective: -Pain reasonably well controlled  -Complaining of bladder spasms and leakage around the catheter -Tolerated clears overnight  -Great urine output, minimal drain output  -Patient anxious about going home today  Objective: Vital signs in last 24 hours: Temp:  [97.5 F (36.4 C)-98.6 F (37 C)] 97.8 F (36.6 C) (11/05 0606) Pulse Rate:  [82-110] 98 (11/05 0606) Resp:  [10-20] 17 (11/05 0606) BP: (104-146)/(77-91) 104/79 (11/05 0606) SpO2:  [91 %-99 %] 95 % (11/05 0606) Weight:  [94.3 kg] 94.3 kg (11/04 1014)  Intake/Output from previous day: 11/04 0701 - 11/05 0700 In: 4000.2 [I.V.:3650.2; IV Piggyback:350] Out: 1219 [Urine:3400; Drains:75; Blood:150] Intake/Output this shift: No intake/output data recorded.  Physical Exam:  General: Alert and oriented CV: RRR Lungs: Clear Abdomen: Soft, ND, nontender to palpation  Incisions: c/d/I, LLQ JP drain with serosanguinous output Gu: 3 way foley in place draining clear yellow urine  Ext: NT, No erythema  Lab Results: Recent Labs    11/13/20 1637 11/14/20 0450  HGB 12.1* 13.0  HCT 37.1* 39.6   BMET Recent Labs    11/13/20 1637 11/14/20 0450  NA 135 134*  K 4.9 4.8  CL 103 101  CO2 22 22  GLUCOSE 198* 213*  BUN 22 19  CREATININE 1.13 1.18  CALCIUM 8.9 9.2     Studies/Results: No results found.  Assessment/Plan: 70 y/o male POD1 from left NephU, recovering well.  -Advance to regular diet today -Stop fluids  -Will start myrbetriq 25mg  and B&O suppositories for bladder spasms  -Check JP creatinine; remove if negative  -Encourage ambulation and IS -Likely discharge tomorrow am   LOS: 1 day   Lavaeh Bau 11/14/2020, 9:20 AM

## 2020-11-15 LAB — HEMOGLOBIN AND HEMATOCRIT, BLOOD
HCT: 36.3 % — ABNORMAL LOW (ref 39.0–52.0)
Hemoglobin: 12.2 g/dL — ABNORMAL LOW (ref 13.0–17.0)

## 2020-11-15 LAB — BASIC METABOLIC PANEL
Anion gap: 8 (ref 5–15)
BUN: 24 mg/dL — ABNORMAL HIGH (ref 8–23)
CO2: 27 mmol/L (ref 22–32)
Calcium: 9.3 mg/dL (ref 8.9–10.3)
Chloride: 99 mmol/L (ref 98–111)
Creatinine, Ser: 1.22 mg/dL (ref 0.61–1.24)
GFR, Estimated: >60 mL/min (ref >60)
Glucose, Bld: 296 mg/dL — ABNORMAL HIGH (ref 70–99)
Potassium: 4.5 mmol/L (ref 3.5–5.1)
Sodium: 134 mmol/L — ABNORMAL LOW (ref 135–145)

## 2020-11-15 LAB — GLUCOSE, CAPILLARY: Glucose-Capillary: 167 mg/dL — ABNORMAL HIGH (ref 70–99)

## 2020-11-15 MED ORDER — ARTIFICIAL TEARS OPHTHALMIC OINT
TOPICAL_OINTMENT | OPHTHALMIC | Status: DC | PRN
  Filled 2020-11-15: qty 3.5

## 2020-11-15 MED ORDER — ACETAMINOPHEN 325 MG PO TABS
650.0000 mg | ORAL_TABLET | Freq: Four times a day (QID) | ORAL | Status: DC | PRN
  Administered 2020-11-15: 650 mg via ORAL
  Filled 2020-11-15: qty 2

## 2020-11-15 MED ORDER — OXYCODONE-ACETAMINOPHEN 5-325 MG PO TABS: 1.0000 | ORAL_TABLET | Freq: Four times a day (QID) | ORAL | 0 refills | Status: DC | PRN

## 2020-11-15 NOTE — Progress Notes (Signed)
IV's removed. JP drain removed. Discharge packet discussed with patient. Prescriptions, follow-up appointment, and discharge instructions gone over. Patient awaiting wife for ride.

## 2020-11-16 ENCOUNTER — Other Ambulatory Visit (HOSPITAL_COMMUNITY): Payer: Self-pay

## 2020-11-16 NOTE — Op Note (Signed)
Frank Ware, Frank Ware MEDICAL RECORD NO: [id number] ACCOUNT NO: [id number] DATE OF BIRTH: Aug 15, 1950 FACILITY: [facility] LOCATION: [id number] PHYSICIAN: Alexis Frock, MD  Operative Report   PREOPERATIVE DIAGNOSES:  High-grade left renal pelvis cancer, superficial bladder cancer.  PROCEDURE: 1.  Laparoscopic left nephroureterectomy. 2.  Cystoscopy.  ESTIMATED BLOOD LOSS:  150 mL.  COMPLICATIONS:  None.  SPECIMEN: 1.  Left kidney with ureter en bloc. 2.  Left ureteral stent inspected and complete for discard.  ASSISTANT:  Davis Gourd MD  FINDINGS:  1.  Single artery, single vein, left renal vascular anatomy as anticipated. 2.  Some desmoplasia around left ureter consistent with stenting. 3.  Inadvertent transection of distal stent during nephroureterectomy.  Bladder component retrieved cystoscopically at the end of the case. The entire stent was therefore completely removed.  DRAINS:  1.  Jackson-Pratt drain for bulb suction. 2.  A 3-way Foley catheter to straight drain, irrigation port plugged.  INDICATIONS FOR PROCEDURE:  The patient is a pleasant 70 year old man who was found to have multifocal high-grade urothelial carcinoma involving the left renal pelvis, fairly large volume, not amenable to endoscopic management as well as significant  bladder tumor.  He underwent transection of bladder tumor previously, which revealed stage I disease.  We discussed options of bladder preservation versus non-bladder preservation for management, all options including left nephroureterectomy and he  wished to proceed with a trial of bladder preservation.  As such, he underwent restaging transurethral resection, at which point he was found to have no residual bladder tumor and again just his left renal pelvis tumor. He was therefore counseled towards  left nephroureterectomy alone, keeping his goals of care and he wished to proceed.  Informed consent was obtained and placed in medical  record.  DESCRIPTION OF PROCEDURE:  The patient being verified, procedure being left nephroureterectomy was confirmed.  The procedure timeout was performed.  Intravenous antibiotics was administered. General endotracheal anesthesia induced.  The patient was  placed in the left side up, full flank position, pulling 15 degrees of table flexion, superior arm elevator, axillary rolls and sequential compression devices, bottom leg bent, top leg straight.  He was further fashioned to the operating table using  3-inch tape over foam padding across supraxiphoid chest and his pelvis.  The beanbag was deployed.  Foley catheter was placed per urethra for straight drain.  Sterile field was created, prepping and draping the patient's entire left flank and abdomen  using chlorhexidine gluconate.  Next, a high flow, low pressure pneumoperitoneum was obtained using Veress technique in the left lower quadrant and passed the aspiration and drop test.  An 8 mm robotic camera port was then placed and positioned  approximately 1-1/2 handbreadths superior lateral to the umbilicus.  Laparoscopic examination of the peritoneal cavity revealed no significant adhesions, no visceral injury.  Additional ports had been placed as follows:  Left paramedian subcostal robotic  port just lateral to the plane of the camera port.  Left paramedian inferior robotic port approximately 4 fingerbreadths superior to the pubic ramus, left far lateral 8 mm robotic port approximately 4 fingerbreadths superior medial to the anterior  superior iliac spine and two 12 mm assistant port sites in the midline, one approximately 2 fingerbreadths superior to the camera port and one 3 fingerbreadths inferior. Robot was docked and passed the electronic checks.  Attention was directed at the  development of the retroperitoneum.  Incision was made lateral to the descending colon from the area of the splenic flexure towards the  area of the internal ring.  The colon  was carefully swept medially.  The anterior surface of Gerota's fascia was then  noted and additional dissection was performed of the descending colon mesentery off the anterior surface of the kidney. Lower pole of the kidney was identified and placed on gentle lateral traction.  Dissection proceeded medial to this.  The ureter and  gonadal vessels were encountered, placed on gentle lateral traction.  The psoas musculature was identified and dissection proceeded within this triangle towards the area of the renal hilum.  Renal hilum consisted of a single artery, single vein left  renal vascular anatomy as anticipated.  Artery was controlled using an extra-large Hem-o-lok clip proximal, vascular stapler distal. Vein was controlled using vascular stapler.  Dissection proceeded in a plane, thus keeping the adrenal with the kidney  specimen just lateral to the aorta and superior attachments were taken down with cautery scissors as were the lateral attachments.  This completely freed up the kidney portion of the specimen.  The ureter was then marked with a vessel loop and then  circumferentially mobilized distally in the area of the deep pelvis.  Incision was made lateral to the left median umbilical ligament which then became the floor of dissection and the ureter was again circumferentially mobilized past the iliac vessels  towards the area of the ureterovesical junction, which was denoted by the superior vesical artery.  At this point, a 3-0 V-Loc stitch was placed medial to the ureter through and through, I had to use a stay stitch and then circumferential extravesical  dissection was performed of the distal most ureter down to the area quite close to the ureteral orifice. During the dissection, the in situ stent was melted across with cautery inadvertently, such the distal end remained in the bladder and the proximal end  with the nephroureterectomy specimen.  The small cystotomy was then closed with the  previous stay suture, oversewing it in 2 layers.  Hemostasis was excellent.  Sponge and needle counts were correct.  We achieved the goals of the extirpated portion of  the procedure today.  Closed suction drain was brought out through the previous left lateral most robotic port site into the peritoneal cavity.  Large kidney plus ureter en bloc specimen was placed in the EndoCatch bag.  The robot was then undocked.   Specimen was retrieved by extending the previous assistant port site in the midline and removing the kidney plus ureter specimen, setting aside for permanent pathology.  The extraction site was closed at the level of the fascia using figure-of-eight PDS  x 8 followed by reapproximation of Scarpa's with running Vicryl.  All incision sites were infiltrated with dilute lipolyzed Marcaine and closed at the level of skin using subcuticular Monocryl with Dermabond.  The patient was then made supine, once  again, his in situ Foley catheter was removed and his penis was prepped using iodine.  Cystourethroscopy was performed using a 16-French flexible cystoscope.  Inspection of the anterior and posterior urethra was unremarkable.  Inspection of the urinary  bladder revealed the intravesical distal portion of the stent as anticipated.  This was grasped with cold graspers, removed and set aside on the back table and the more proximal portion of the stent was also removed and the stent was found to be  completely removed, thus removing it in its entirety.  A new 3-way Foley catheter was then placed per urethra for straight drain, 10 mL water in the balloon.  Irrigation port plugged.  The procedure was terminated.  The patient tolerated the procedure well.  No  immediate periprocedural complications.  The patient was taken to postanesthesia care in stable condition.  PLAN:  For inpatient admission.   SHW D: 11/13/2020 3:39:06 pm T: 11/14/2020 3:36:00 am  JOB: [id number]/ [id number]

## 2020-11-19 LAB — SURGICAL PATHOLOGY

## 2020-11-24 DIAGNOSIS — Z905 Acquired absence of kidney: Secondary | ICD-10-CM | POA: Diagnosis not present

## 2020-12-09 ENCOUNTER — Encounter: Payer: Self-pay | Admitting: Cardiology

## 2020-12-09 ENCOUNTER — Ambulatory Visit: Payer: Medicare Other | Admitting: Cardiology

## 2020-12-09 ENCOUNTER — Other Ambulatory Visit: Payer: Self-pay

## 2020-12-09 VITALS — BP 90/60 | HR 84 | Ht 66.0 in | Wt 209.0 lb

## 2020-12-09 DIAGNOSIS — D4112 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: Secondary | ICD-10-CM

## 2020-12-09 DIAGNOSIS — E118 Type 2 diabetes mellitus with unspecified complications: Secondary | ICD-10-CM

## 2020-12-09 DIAGNOSIS — C679 Malignant neoplasm of bladder, unspecified: Secondary | ICD-10-CM

## 2020-12-09 DIAGNOSIS — G4733 Obstructive sleep apnea (adult) (pediatric): Secondary | ICD-10-CM | POA: Diagnosis not present

## 2020-12-09 DIAGNOSIS — I25118 Atherosclerotic heart disease of native coronary artery with other forms of angina pectoris: Secondary | ICD-10-CM | POA: Diagnosis not present

## 2020-12-09 DIAGNOSIS — I10 Essential (primary) hypertension: Secondary | ICD-10-CM | POA: Diagnosis not present

## 2020-12-09 NOTE — Progress Notes (Signed)
Cardiology Office Note:    Date:  12/09/2020   ID:  Frank Ware, DOB 09/27/1950, MRN [id number]  PCP:  Street, [name], MD  Cardiologist:  Frank Campus, MD    Referring MD: Frank Ware, *   Chief Complaint  Patient presents with   Follow-up  Doing fine after surgery  History of Present Illness:    Frank Ware is a 70 y.o. male  with past medical history significant for diabetes, essential hypertension, he is an ex-smoker, chronic swelling of lower extremities.  Few months ago he had a stress test done which showed minimal abnormality involving the basal portion of the inferior wall we had a long discussion about what to do with the situation he definitely favor medical therapy.  I put him on the ranolazine 500 mg twice daily with excellent response he comes today to my office for follow-up. In the meantime he did have surgery for his urinary bladder apparently he was found to have 4 tumors that he was discovered to have also tumors within the pelvis of his left kidney he end up having robotic assisted nephrectomy.  That happened about a month ago.  Overall no cardiac complications.  Please recovering from it still complain of being weak tired exhausted.  Described to have few episode of discomfort which is localized to between the left upper portion of his abdomen difficult to tell if this is cardiac related or not it happened a different situation.  He takes nitroglycerin for it with good release.  Past Medical History:  Diagnosis Date   Abnormal stress test 12/03/2019   Acute pain 12/27/2018   Arthropathy of cervical facet joint 12/14/2016   Bilateral occipital neuralgia 10/13/2016   Body mass index 34.0-34.9, adult 09/02/2013   BPPV (benign paroxysmal positional vertigo)    Cancer (HCC)    Cervical pseudoarthrosis, sequela 02/07/2017   Cervical spinal stenosis 10/13/2016   Chronic kidney disease    Chronic pain syndrome 48/18/5631   Diastolic dysfunction  49/70/2637   Duodenal ulcer    Non-bleeding on EGD, March 2021   Dyslipidemia 11/18/2019   Encounter for therapeutic drug monitoring 10/13/2016   Essential hypertension 11/18/2019   Hiatal hernia    High cholesterol    History of COVID-19 06/2019   History of kidney stones    Hypertension    Postlaminectomy syndrome of cervical region 10/13/2016   Precordial chest pain 11/18/2019   Presence of right artificial knee joint 10/13/2016   Trochanteric bursitis of right hip 10/13/2016   Type 2 diabetes mellitus with complication, without long-term current use of insulin ([place]) 11/18/2019   Type 2 diabetes mellitus with hyperglycemia, without long-term current use of insulin ([place])    Vitamin D deficiency     Past Surgical History:  Procedure Laterality Date   BLADDER TUMOR EXCISION     CERVICAL FUSION     C3-C4 1992 and C5-C6 2001.   CERVICAL FUSION     x 2vc   COLONOSCOPY     CYSTOSCOPY N/A 11/13/2020   Procedure: CYSTOSCOPY;  Surgeon: Frank Frock, MD;  Location: [facility];  Service: Urology;  Laterality: N/A;   CYSTOSCOPY/RETROGRADE/URETEROSCOPY Bilateral 09/25/2020   Procedure: CYSTOSCOPY/BILATERAL RETROGRADE/ RIGHT DIAGNOSTIC URETEROSCOPY/ LEFT URETERSOSCOPY WITH BIOPSY AND STENT;  Surgeon: Frank Frock, MD;  Location: [facility];  Service: Urology;  Laterality: Bilateral;   CYSTOSCOPY/URETEROSCOPY/HOLMIUM LASER/STENT PLACEMENT Left 11/22/2018   Procedure: CYSTOSCOPY LEFT URETEROSCOPY/HOLMIUM LASER/STENT EXCHANGE;  Surgeon: Frank Seal, MD;  Location[facility];  Service: Urology;  Laterality: Left;   CYSTOSCOPY/URETEROSCOPY/HOLMIUM LASER/STENT PLACEMENT Left 12/25/2018   Procedure: CYSTOSCOPY LEFT RETROGRADE PYELOGRAM LEFT URETEROSCOPY WITH HOLMIUM LASER LEFT STENT EXCHANGE;  Surgeon: Frank Seal, MD;  Location: [facility];  Service: Urology;  Laterality: Left;   KNEE ARTHROPLASTY Right    KNEE ARTHROSCOPY Bilateral 1985   left knee arthroscopy       LITHOTRIPSY     right kne e replacement      ROBOT ASSITED LAPAROSCOPIC NEPHROURETERECTOMY Left 11/13/2020   Procedure: XI ROBOT ASSITED LAPAROSCOPIC NEPHROURETERECTOMY;  Surgeon: Frank Frock, MD;  Location: [facility];  Service: Urology;  Laterality: Left;  3 HRS   TONSILLECTOMY     TRANSURETHRAL RESECTION OF BLADDER TUMOR N/A 09/25/2020   Procedure: RESTAGING TRANSURETHRAL RESECTION OF BLADDER TUMOR (TURBT);  Surgeon: Frank Frock, MD;  Location: [facility];  Service: Urology;  Laterality: N/A;   TRANSURETHRAL RESECTION OF BLADDER TUMOR WITH MITOMYCIN-C Bilateral 08/17/2020   Procedure: TRANSURETHRAL RESECTION OF BLADDER TUMOR , CYSTOSCOPY BILATERAL RETROGRADES;  Surgeon: Frank Seal, MD;  Location: [facility];  Service: Urology;  Laterality: Bilateral;   UPPER GI ENDOSCOPY      Current Medications: Current Meds  Medication Sig   acetaminophen (TYLENOL) 325 MG tablet Take 650 mg by mouth every 6 (six) hours as needed for mild pain, fever or headache.   amLODipine (NORVASC) 2.5 MG tablet Take 2.5 mg by mouth daily.   atorvastatin (LIPITOR) 20 MG tablet Take 20 mg by mouth daily.    buprenorphine (SUBUTEX) 2 MG SUBL SL tablet Place 2 mg under the tongue every 12 (twelve) hours as needed (pain).   glipiZIDE (GLUCOTROL XL) 10 MG 24 hr tablet Take 10 mg by mouth in the morning and at bedtime.    HYDROcodone-acetaminophen (NORCO) 10-325 MG tablet Take 1 tablet by mouth every 6 (six) hours as needed for pain.   metFORMIN (GLUCOPHAGE) 1000 MG tablet Take 1,000 mg by mouth 2 (two) times daily with a meal.    mupirocin ointment (BACTROBAN) 2 % Apply 1 application topically daily as needed for irritation.   nitroGLYCERIN (NITROSTAT) 0.4 MG SL tablet Place 1 tablet (0.4 mg total) under the tongue every 5 (five) minutes as needed.   olmesartan (BENICAR) 40 MG tablet Take 40 mg by mouth daily.    omeprazole (PRILOSEC) 40 MG capsule Take 40 mg by mouth daily.   ondansetron (ZOFRAN) 8 MG tablet Take 8 mg by  mouth every 8 (eight) hours as needed for nausea or vomiting.   OVER THE COUNTER MEDICATION Apply 1 application topically daily as needed (neck pain). Joint Flex   oxybutynin (DITROPAN) 5 MG tablet Take 5 mg by mouth 3 (three) times daily as needed for bladder spasms.   oxyCODONE-acetaminophen (PERCOCET) 5-325 MG tablet Take 1 tablet by mouth every 6 (six) hours as needed for severe pain (post-operatively).   ranolazine (RANEXA) 500 MG 12 hr tablet Take 1 tablet (500 mg total) by mouth 2 (two) times daily.   senna-docusate (SENOKOT-S) 8.6-50 MG tablet Take 1 tablet by mouth 2 (two) times daily. While taking strongest pain meds to prevent constipation.   traMADol (ULTRAM) 50 MG tablet Take 100 mg by mouth every 6 (six) hours as needed for moderate pain.   Vitamin D, Ergocalciferol, (DRISDOL) 1.25 MG (50000 UNIT) CAPS capsule Take 50,000 Units by mouth every Tuesday.   zolpidem (AMBIEN) 10 MG tablet Take 10 mg by mouth at bedtime.     Allergies:   Patient has no  known allergies.   Social History   Socioeconomic History   Marital status: Married    Spouse name: Not on file   Number of children: Not on file   Years of education: Not on file   Highest education level: Not on file  Occupational History   Occupation: retired Interior and spatial designer  Tobacco Use   Smoking status: Former    Types: Cigarettes    Quit date: 11/07/1994    Years since quitting: 26.1   Smokeless tobacco: Never  Vaping Use   Vaping Use: Never used  Substance and Sexual Activity   Alcohol use: Never   Drug use: Never   Sexual activity: Yes  Other Topics Concern   Not on file  Social History Narrative   Not on file   Social Determinants of Health   Financial Resource Strain: Not on file  Food Insecurity: Not on file  Transportation Needs: Not on file  Physical Activity: Not on file  Stress: Not on file  Social Connections: Not on file     Family History: [name] family history includes Arthritis  in his mother and sister; Diabetes in his maternal grandfather; Heart attack in his paternal grandfather; Hypertension in his maternal grandfather; Lymphoma in his brother; Pancreatic cancer in his brother. There is no history of Colon cancer, Colon polyps, Esophageal cancer, Rectal cancer, or Stomach cancer. ROS:   Please see the history of present illness.    All 14 point review of systems negative except as described per history of present illness  EKGs/Labs/Other Studies Reviewed:      Recent Labs: 05/27/2020: NT-Pro BNP 135 10/27/2020: Platelets 206 11/15/2020: BUN 24; Creatinine, Ser 1.22; Hemoglobin 12.2; Potassium 4.5; Sodium 134  Recent Lipid Panel No results found for: CHOL, TRIG, HDL, CHOLHDL, VLDL, LDLCALC, LDLDIRECT  Physical Exam:    VS:  BP 90/60 (BP Location: Left Arm, Patient Position: Sitting, Cuff Size: Normal)   Pulse 84   Ht 5\' 6"  (1.676 m)   Wt 209 lb (94.8 kg)   SpO2 95%   BMI 33.73 kg/m     Wt Readings from Last 3 Encounters:  12/09/20 209 lb (94.8 kg)  11/13/20 208 lb (94.3 kg)  10/27/20 212 lb (96.2 kg)     GEN:  Well nourished, well developed in no acute distress HEENT: Normal NECK: No JVD; No carotid bruits LYMPHATICS: No lymphadenopathy CARDIAC: RRR, no murmurs, no rubs, no gallops RESPIRATORY:  Clear to auscultation without rales, wheezing or rhonchi  ABDOMEN: Soft, non-tender, non-distended MUSCULOSKELETAL:  No edema; No deformity  SKIN: Warm and dry LOWER EXTREMITIES: no swelling NEUROLOGIC:  Alert and oriented x 3 PSYCHIATRIC:  Normal affect   ASSESSMENT:    1. Coronary artery disease of native artery of native heart with stable angina pectoris ([place])   2. Essential hypertension   3. OSA (obstructive sleep apnea)   4. Type 2 diabetes mellitus with complication, without long-term current use of insulin ([place])   5. Malignant neoplasm of urinary bladder, unspecified site ([place])   6. 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     PLAN:    In order of problems listed above:  Coronary disease minimally abnormal stress test.  Managed medically.  He is on amlodipine he is also on ranolazine.  Still have some episode of pain on the left side difficulty drops if this is related to recent surgery of this is related to his heart.  I wanted to increase dose of ranolazine however previously  we tried to do that he did not tolerate it well.  I will put him on long-acting nitroglycerin however his blood pressure is very borderline therefore we will not do it he takes nitroglycerin few times a month.  I told him that the frequency of nitroglycerin usage increase all use duration and severity of pain increase he needs to let me know in the meantime I will continue present management. Essential hypertension actually we dealing with opposite problem blood pressure being low. Obstructive sleep apnea: That problem has been managed by internal medicine team Type 2 diabetes seems to be well controlled I did review K PN which show me his hemoglobin A1c of 5.9 this is from 10/27/2020. Malignant tumor of urinary bladder s/p resection s/p resection of left kidney.  Pressure currently recovering.   Medication Adjustments/Labs and Tests Ordered: Current medicines are reviewed at length with the patient today.  Concerns regarding medicines are outlined above.  No orders of the defined types were placed in this encounter.  Medication changes: No orders of the defined types were placed in this encounter.   Signed, Park Liter, MD, [facility] Ware 12/09/2020 8:45 AM    [place]

## 2020-12-09 NOTE — Patient Instructions (Signed)

## 2020-12-16 DIAGNOSIS — E785 Hyperlipidemia, unspecified: Secondary | ICD-10-CM | POA: Diagnosis not present

## 2020-12-16 DIAGNOSIS — E1165 Type 2 diabetes mellitus with hyperglycemia: Secondary | ICD-10-CM | POA: Diagnosis not present

## 2020-12-16 DIAGNOSIS — Z79899 Other long term (current) drug therapy: Secondary | ICD-10-CM | POA: Diagnosis not present

## 2020-12-16 DIAGNOSIS — E782 Mixed hyperlipidemia: Secondary | ICD-10-CM | POA: Diagnosis not present

## 2020-12-22 DIAGNOSIS — M79675 Pain in left toe(s): Secondary | ICD-10-CM

## 2020-12-22 DIAGNOSIS — L600 Ingrowing nail: Secondary | ICD-10-CM | POA: Insufficient documentation

## 2020-12-22 DIAGNOSIS — B351 Tinea unguium: Secondary | ICD-10-CM

## 2020-12-22 HISTORY — DX: Ingrowing nail: L60.0

## 2020-12-22 HISTORY — DX: Pain in left toe(s): M79.675

## 2020-12-22 HISTORY — DX: Tinea unguium: B35.1

## 2020-12-23 DIAGNOSIS — I110 Hypertensive heart disease with heart failure: Secondary | ICD-10-CM | POA: Diagnosis not present

## 2020-12-23 DIAGNOSIS — N1832 Chronic kidney disease, stage 3b: Secondary | ICD-10-CM | POA: Diagnosis not present

## 2020-12-23 DIAGNOSIS — E785 Hyperlipidemia, unspecified: Secondary | ICD-10-CM | POA: Diagnosis not present

## 2020-12-23 DIAGNOSIS — I5032 Chronic diastolic (congestive) heart failure: Secondary | ICD-10-CM | POA: Diagnosis not present

## 2020-12-23 DIAGNOSIS — Z0000 Encounter for general adult medical examination without abnormal findings: Secondary | ICD-10-CM | POA: Diagnosis not present

## 2020-12-23 DIAGNOSIS — E1169 Type 2 diabetes mellitus with other specified complication: Secondary | ICD-10-CM | POA: Diagnosis not present

## 2020-12-23 DIAGNOSIS — C678 Malignant neoplasm of overlapping sites of bladder: Secondary | ICD-10-CM | POA: Diagnosis not present

## 2020-12-25 DIAGNOSIS — S129XXS Fracture of neck, unspecified, sequela: Secondary | ICD-10-CM | POA: Diagnosis not present

## 2020-12-25 DIAGNOSIS — E1169 Type 2 diabetes mellitus with other specified complication: Secondary | ICD-10-CM | POA: Diagnosis not present

## 2020-12-25 DIAGNOSIS — M961 Postlaminectomy syndrome, not elsewhere classified: Secondary | ICD-10-CM | POA: Diagnosis not present

## 2020-12-25 DIAGNOSIS — G894 Chronic pain syndrome: Secondary | ICD-10-CM | POA: Diagnosis not present

## 2021-01-02 DIAGNOSIS — J209 Acute bronchitis, unspecified: Secondary | ICD-10-CM | POA: Diagnosis not present

## 2021-01-02 DIAGNOSIS — J0100 Acute maxillary sinusitis, unspecified: Secondary | ICD-10-CM | POA: Diagnosis not present

## 2021-02-17 DIAGNOSIS — Z905 Acquired absence of kidney: Secondary | ICD-10-CM | POA: Diagnosis not present

## 2021-02-25 DIAGNOSIS — C678 Malignant neoplasm of overlapping sites of bladder: Secondary | ICD-10-CM | POA: Diagnosis not present

## 2021-03-01 ENCOUNTER — Other Ambulatory Visit: Payer: Self-pay | Admitting: Urology

## 2021-03-02 NOTE — Patient Instructions (Addendum)
DUE TO COVID-19 ONLY ONE VISITOR IS ALLOWED TO COME WITH YOU AND STAY IN THE WAITING ROOM ONLY DURING PRE OP AND PROCEDURE.   **NO VISITORS ARE ALLOWED IN THE SHORT STAY AREA OR RECOVERY ROOM!!**  IF YOU WILL BE ADMITTED INTO THE HOSPITAL YOU ARE ALLOWED ONLY TWO SUPPORT PEOPLE DURING VISITATION HOURS ONLY (7 AM -8PM)   The support person(s) must pass our screening, gel in and out, and wear a mask at all times, including in the patients room. Patients must also wear a mask when staff or their support person are in the room. Visitors GUEST BADGE MUST BE WORN VISIBLY  One adult visitor may remain with you overnight and MUST be in the room by 8 P.M.  No visitors under the age of 75. Any visitor under the age of 28 must be accompanied by an adult.    Your procedure is scheduled on: 03/12/21   Report to [facility] Main Entrance    Report to admitting at: 1:00  PM   Call this number if you have problems the morning of surgery [phone]   Do not eat food :After Midnight.   May have liquids until : 12:00 PM   day of surgery  CLEAR LIQUID DIET  Foods Allowed                                                                     Foods Excluded  Water, Black Coffee and tea, regular and decaf                             liquids that you cannot  Plain Jell-O in any flavor  (No red)                                           see through such as: Fruit ices (not with fruit pulp)                                     milk, soups, orange juice              Iced Popsicles (No red)                                    All solid food                                   Apple juices Sports drinks like Gatorade (No red) Lightly seasoned clear broth or consume(fat free) Sugar Sample Menu Breakfast                                Lunch  Supper Cranberry juice                    Beef broth                            Chicken broth Jell-O                                      Grape juice                           Apple juice Coffee or tea                        Jell-O                                      Popsicle                                                Coffee or tea                        Coffee or tea    FOLLOW BOWEL PREP AND ANY ADDITIONAL PRE OP INSTRUCTIONS YOU RECEIVED FROM YOUR SURGEON'S OFFICE!!!    Oral Hygiene is also important to reduce your risk of infection.                                    Remember - BRUSH YOUR TEETH THE MORNING OF SURGERY WITH YOUR REGULAR TOOTHPASTE   Do NOT smoke after Midnight   Take these medicines the morning of surgery with A SIP OF WATER: ranolazine,amlodipine,omeprazole.Use tylenol as needed.  How to Manage Your Diabetes Before and After Surgery  Why is it important to control my blood sugar before and after surgery? Improving blood sugar levels before and after surgery helps healing and can limit problems. A way of improving blood sugar control is eating a healthy diet by:  Eating less sugar and carbohydrates  Increasing activity/exercise  Talking with your doctor about reaching your blood sugar goals High blood sugars (greater than 180 mg/dL) can raise your risk of infections and slow your recovery, so you will need to focus on controlling your diabetes during the weeks before surgery. Make sure that the doctor who takes care of your diabetes knows about your planned surgery including the date and location.  How do I manage my blood sugar before surgery? Check your blood sugar at least 4 times a day, starting 2 days before surgery, to make sure that the level is not too high or low. Check your blood sugar the morning of your surgery when you wake up and every 2 hours until you get to the Short Stay unit. If your blood sugar is less than 70 mg/dL, you will need to treat for low blood sugar: Do not take insulin. Treat a low blood sugar (less than 70 mg/dL) with  cup of clear juice (cranberry or apple), 4  glucose tablets, OR glucose gel.  Recheck blood sugar in 15 minutes after treatment (to make sure it is greater than 70 mg/dL). If your blood sugar is not greater than 70 mg/dL on recheck, call [phone] for further instructions. Report your blood sugar to the short stay nurse when you get to Short Stay.  If you are admitted to the hospital after surgery: Your blood sugar will be checked by the staff and you will probably be given insulin after surgery (instead of oral diabetes medicines) to make sure you have good blood sugar levels. The goal for blood sugar control after surgery is 80-180 mg/dL.   WHAT DO I DO ABOUT MY DIABETES MEDICATION?  Do not take oral diabetes medicines (pills) the morning of surgery.  THE DAY BEFORE SURGERY, take metformin as usual.Take ONLY morning dose of glipizide.       THE MORNING OF SURGERY, DO NOT TAKE ANY ORAL DIABETIC MEDICATIONS DAY OF YOUR SURGERY                              You may not have any metal on your body including hair pins, jewelry, and body piercing             Do not wear lotions, powders, perfumes/cologne, or deodorant              Men may shave face and neck.   Do not bring valuables to the hospital. [place].   Contacts, dentures or bridgework may not be worn into surgery.   Bring small overnight bag day of surgery.    Patients discharged on the day of surgery will not be allowed to drive home.  Someone needs to stay with you for the first 24 hours after anesthesia.   Special Instructions: Bring a copy of your healthcare power of attorney and living will documents         the day of surgery if you haven't scanned them before.              Please read over the following fact sheets you were given: IF YOU HAVE QUESTIONS ABOUT YOUR PRE-OP INSTRUCTIONS PLEASE CALL [phone]     [facility] - Preparing for Surgery Before surgery, you can play an important role.  Because skin is  not sterile, your skin needs to be as free of germs as possible.  You can reduce the number of germs on your skin by washing with CHG (chlorahexidine gluconate) soap before surgery.  CHG is an antiseptic cleaner which kills germs and bonds with the skin to continue killing germs even after washing. Please DO NOT use if you have an allergy to CHG or antibacterial soaps.  If your skin becomes reddened/irritated stop using the CHG and inform your nurse when you arrive at Short Stay. Do not shave (including legs and underarms) for at least 48 hours prior to the first CHG shower.  You may shave your face/neck. Please follow these instructions carefully:  1.  Shower with CHG Soap the night before surgery and the  morning of Surgery.  2.  If you choose to wash your hair, wash your hair first as usual with your  normal  shampoo.  3.  After you shampoo, rinse your hair and body thoroughly to remove the  shampoo.  4.  Use CHG as you would any other liquid soap.  You can apply chg directly  to the skin and wash                       Gently with a scrungie or clean washcloth.  5.  Apply the CHG Soap to your body ONLY FROM THE NECK DOWN.   Do not use on face/ open                           Wound or open sores. Avoid contact with eyes, ears mouth and genitals (private parts).                       Wash face,  Genitals (private parts) with your normal soap.             6.  Wash thoroughly, paying special attention to the area where your surgery  will be performed.  7.  Thoroughly rinse your body with warm water from the neck down.  8.  DO NOT shower/wash with your normal soap after using and rinsing off  the CHG Soap.                9.  Pat yourself dry with a clean towel.            10.  Wear clean pajamas.            11.  Place clean sheets on your bed the night of your first shower and do not  sleep with pets. Day of Surgery : Do not apply any lotions/deodorants the morning of surgery.   Please wear clean clothes to the hospital/surgery center.  FAILURE TO FOLLOW THESE INSTRUCTIONS MAY RESULT IN THE CANCELLATION OF YOUR SURGERY PATIENT SIGNATURE_________________________________  NURSE SIGNATURE__________________________________  ________________________________________________________________________

## 2021-03-02 NOTE — Progress Notes (Signed)
Pt. Needs orders for upcoming surgery. ?

## 2021-03-03 ENCOUNTER — Encounter (HOSPITAL_COMMUNITY)
Admission: RE | Admit: 2021-03-03 | Discharge: 2021-03-03 | Disposition: A | Discharge status: Home / Self Care | Payer: Medicare Other | Source: Ambulatory Visit | Attending: Urology | Admitting: Urology

## 2021-03-03 ENCOUNTER — Other Ambulatory Visit: Payer: Self-pay

## 2021-03-03 ENCOUNTER — Encounter (HOSPITAL_COMMUNITY): Payer: Self-pay

## 2021-03-03 VITALS — BP 133/91 | HR 81 | Temp 98.0°F | Resp 18 | Ht 69.0 in | Wt 204.0 lb

## 2021-03-03 DIAGNOSIS — E1165 Type 2 diabetes mellitus with hyperglycemia: Secondary | ICD-10-CM | POA: Insufficient documentation

## 2021-03-03 DIAGNOSIS — Z01812 Encounter for preprocedural laboratory examination: Secondary | ICD-10-CM | POA: Diagnosis not present

## 2021-03-03 DIAGNOSIS — I10 Essential (primary) hypertension: Secondary | ICD-10-CM | POA: Insufficient documentation

## 2021-03-03 HISTORY — DX: Angina pectoris, unspecified: I20.9

## 2021-03-03 HISTORY — DX: Unspecified osteoarthritis, unspecified site: M19.90

## 2021-03-03 LAB — BASIC METABOLIC PANEL
Anion gap: 4 — ABNORMAL LOW (ref 5–15)
BUN: 36 mg/dL — ABNORMAL HIGH (ref 8–23)
CO2: 24 mmol/L (ref 22–32)
Calcium: 9.6 mg/dL (ref 8.9–10.3)
Chloride: 108 mmol/L (ref 98–111)
Creatinine, Ser: 1.58 mg/dL — ABNORMAL HIGH (ref 0.61–1.24)
GFR, Estimated: 47 mL/min — ABNORMAL LOW (ref >60)
Glucose, Bld: 120 mg/dL — ABNORMAL HIGH (ref 70–99)
Potassium: 5.3 mmol/L — ABNORMAL HIGH (ref 3.5–5.1)
Sodium: 136 mmol/L (ref 135–145)

## 2021-03-03 LAB — CBC
HCT: 37.5 % — ABNORMAL LOW (ref 39.0–52.0)
Hemoglobin: 12.4 g/dL — ABNORMAL LOW (ref 13.0–17.0)
MCH: 32 pg (ref 26.0–34.0)
MCHC: 33.1 g/dL (ref 30.0–36.0)
MCV: 96.6 fL (ref 80.0–100.0)
Platelets: 175 10*3/uL (ref 150–400)
RBC: 3.88 MIL/uL — ABNORMAL LOW (ref 4.22–5.81)
RDW: 13.8 % (ref 11.5–15.5)
WBC: 4 10*3/uL (ref 4.0–10.5)
nRBC: 0 % (ref 0.0–0.2)

## 2021-03-03 LAB — GLUCOSE, CAPILLARY: Glucose-Capillary: 118 mg/dL — ABNORMAL HIGH (ref 70–99)

## 2021-03-03 NOTE — Progress Notes (Signed)
COVID Vaccine Completed: NO Date COVID Vaccine completed: COVID vaccine manufacturer: Lorena Test: N/A Bowel prep reminder: N/A  PCP - Dr. [name] Cardiologist - Dr. Jenne Campus. LOV: 12/09/20  Chest x-ray -  EKG - 05/27/20 Stress Test -  ECHO -  Cardiac Cath -  Pacemaker/ICD device last checked:  Sleep Study - Yes CPAP - No  Fasting Blood Sugar - 100's Checks Blood Sugar __3___ times a week.  Blood Thinner Instructions:  Aspirin Instructions: Last Dose:  Anesthesia review: Hx: HTN,DIA,OSA(NO CPAP)  Patient denies shortness of breath, fever, cough and chest pain at PAT appointment   Patient verbalized understanding of instructions that were given to them at the PAT appointment. Patient was also instructed that they will need to review over the PAT instructions again at home before surgery.

## 2021-03-04 LAB — HEMOGLOBIN A1C
Hgb A1c MFr Bld: 6.2 % — ABNORMAL HIGH (ref 4.8–5.6)
Mean Plasma Glucose: 131 mg/dL

## 2021-03-11 ENCOUNTER — Encounter: Payer: Self-pay | Admitting: Cardiology

## 2021-03-11 ENCOUNTER — Ambulatory Visit: Payer: Medicare Other | Admitting: Cardiology

## 2021-03-11 ENCOUNTER — Other Ambulatory Visit: Payer: Self-pay

## 2021-03-11 VITALS — BP 110/64 | HR 97 | Ht 66.0 in | Wt 201.0 lb

## 2021-03-11 DIAGNOSIS — R072 Precordial pain: Secondary | ICD-10-CM

## 2021-03-11 DIAGNOSIS — I10 Essential (primary) hypertension: Secondary | ICD-10-CM

## 2021-03-11 DIAGNOSIS — E118 Type 2 diabetes mellitus with unspecified complications: Secondary | ICD-10-CM

## 2021-03-11 DIAGNOSIS — R9439 Abnormal result of other cardiovascular function study: Secondary | ICD-10-CM

## 2021-03-11 DIAGNOSIS — E785 Hyperlipidemia, unspecified: Secondary | ICD-10-CM | POA: Diagnosis not present

## 2021-03-11 MED ORDER — RANOLAZINE ER 500 MG PO TB12: 500.0000 mg | ORAL_TABLET | Freq: Two times a day (BID) | ORAL | 3 refills | Status: DC

## 2021-03-11 NOTE — Patient Instructions (Signed)

## 2021-03-11 NOTE — Progress Notes (Signed)
Cardiology Office Note:    Date:  03/11/2021   ID:  Frank Ware, DOB January 15, 1950, MRN [id number]  PCP:  Street, [name], MD  Cardiologist:  Jenne Campus, MD    Referring MD: Street, [name], *   Chief Complaint  Patient presents with   Follow-up  Cardiac wise doing well  History of Present Illness:    Frank Ware is a 71 y.o. male with past medical history significant for diabetes, essential hypertension, his neck smoking, chronic swelling of lower extremities, few months ago he did have a stress test which shows minimal abnormality involving basal portion of the inferior wall.  After long deliberation he preferred to have conservative approach with managing him with 1 was not on antiplatelet therapy with success.  He also got a urinary bladder problem he was find to have tumors over there that was resected also have robotic nephrectomy secondary to tumor identified in his kidneys. Comes to my office today.  Overall doing well he described only 1 episode of chest pain that happened when he was sitting otherwise he can walk climb stairs with no difficulties.  He required another gallbladder surgery which will be done tomorrow. Past Medical History:  Diagnosis Date   Abnormal stress test 12/03/2019   Acute pain 12/27/2018   Anginal pain (HCC)    Arthritis    Arthropathy of cervical facet joint 12/14/2016   Bilateral occipital neuralgia 10/13/2016   Body mass index 34.0-34.9, adult 09/02/2013   BPPV (benign paroxysmal positional vertigo)    Cancer (HCC)    Cervical pseudoarthrosis, sequela 02/07/2017   Cervical spinal stenosis 10/13/2016   Chronic kidney disease    Chronic pain syndrome 94/85/4627   Diastolic dysfunction 03/50/0938   Duodenal ulcer    Non-bleeding on EGD, March 2021   Dyslipidemia 11/18/2019   Encounter for therapeutic drug monitoring 10/13/2016   Essential hypertension 11/18/2019   Hiatal hernia    High cholesterol    History of COVID-19  06/2019   History of kidney stones    Hypertension    Postlaminectomy syndrome of cervical region 10/13/2016   Precordial chest pain 11/18/2019   Presence of right artificial knee joint 10/13/2016   Trochanteric bursitis of right hip 10/13/2016   Type 2 diabetes mellitus with complication, without long-term current use of insulin ([place]) 11/18/2019   Type 2 diabetes mellitus with hyperglycemia, without long-term current use of insulin ([place])    Vitamin D deficiency     Past Surgical History:  Procedure Laterality Date   BLADDER TUMOR EXCISION     CERVICAL FUSION     C3-C4 1992 and C5-C6 2001.   CERVICAL FUSION     x 2vc   COLONOSCOPY     CYSTOSCOPY N/A 11/13/2020   Procedure: CYSTOSCOPY;  Surgeon: Alexis Frock, MD;  [facility]: [facility];  Service: Urology;  Laterality: N/A;   CYSTOSCOPY/RETROGRADE/URETEROSCOPY Bilateral 09/25/2020   Procedure: CYSTOSCOPY/BILATERAL RETROGRADE/ RIGHT DIAGNOSTIC URETEROSCOPY/ LEFT URETERSOSCOPY WITH BIOPSY AND STENT;  Surgeon: Alexis Frock, MD;  [facility];  Service: Urology;  Laterality: Bilateral;   CYSTOSCOPY/URETEROSCOPY/HOLMIUM LASER/STENT PLACEMENT Left 11/22/2018   Procedure: CYSTOSCOPY LEFT URETEROSCOPY/HOLMIUM LASER/STENT EXCHANGE;  Surgeon: Irine Seal, MD;  Location: [facility];  Service: Urology;  Laterality: Left;   CYSTOSCOPY/URETEROSCOPY/HOLMIUM LASER/STENT PLACEMENT Left 12/25/2018   Procedure: CYSTOSCOPY LEFT RETROGRADE PYELOGRAM LEFT URETEROSCOPY WITH HOLMIUM LASER LEFT STENT EXCHANGE;  Surgeon: Irine Seal, MD;  Location: [facility];  Service: Urology;  Laterality: Left;   KNEE  ARTHROPLASTY Right    KNEE ARTHROSCOPY Bilateral 1985   left knee arthroscopy      LITHOTRIPSY     right kne e replacement      ROBOT ASSITED LAPAROSCOPIC NEPHROURETERECTOMY Left 11/13/2020   Procedure: XI ROBOT ASSITED LAPAROSCOPIC NEPHROURETERECTOMY;  Surgeon: Alexis Frock, MD;  [facility];  Service: Urology;   Laterality: Left;  3 HRS   TONSILLECTOMY     TRANSURETHRAL RESECTION OF BLADDER TUMOR N/A 09/25/2020   Procedure: RESTAGING TRANSURETHRAL RESECTION OF BLADDER TUMOR (TURBT);  Surgeon: Alexis Frock, MD;  [facility];  Service: Urology;  Laterality: N/A;   TRANSURETHRAL RESECTION OF BLADDER TUMOR WITH MITOMYCIN-C Bilateral 08/17/2020   Procedure: TRANSURETHRAL RESECTION OF BLADDER TUMOR , CYSTOSCOPY BILATERAL RETROGRADES;  Surgeon: Irine Seal, MD;  [facility];  Service: Urology;  Laterality: Bilateral;   UPPER GI ENDOSCOPY      Current Medications: Current Meds  Medication Sig   acetaminophen (TYLENOL) 325 MG tablet Take 650 mg by mouth every 6 (six) hours as needed for mild pain, fever or headache.   amLODipine (NORVASC) 2.5 MG tablet Take 2.5 mg by mouth daily.   atorvastatin (LIPITOR) 20 MG tablet Take 20 mg by mouth daily.    buprenorphine (SUBUTEX) 2 MG SUBL SL tablet Place 2 mg under the tongue every 12 (twelve) hours as needed (pain).   glipiZIDE (GLUCOTROL XL) 10 MG 24 hr tablet Take 10 mg by mouth in the morning and at bedtime.    HYDROcodone-acetaminophen (NORCO) 10-325 MG tablet Take 1 tablet by mouth every 6 (six) hours as needed for pain.   mupirocin ointment (BACTROBAN) 2 % Apply 1 application topically daily as needed for irritation.   nitroGLYCERIN (NITROSTAT) 0.4 MG SL tablet Place 1 tablet (0.4 mg total) under the tongue every 5 (five) minutes as needed.   olmesartan (BENICAR) 40 MG tablet Take 40 mg by mouth daily.    omeprazole (PRILOSEC) 40 MG capsule Take 40 mg by mouth daily.   ondansetron (ZOFRAN) 8 MG tablet Take 8 mg by mouth every 8 (eight) hours as needed for nausea or vomiting.   OVER THE COUNTER MEDICATION Apply 1 application topically daily as needed (neck pain). Joint Flex   oxybutynin (DITROPAN) 5 MG tablet Take 5 mg by mouth 3 (three) times daily as needed for bladder spasms.   polyethylene glycol (MIRALAX / GLYCOLAX) 17 g packet Take 17 g by  mouth daily.   ranolazine (RANEXA) 500 MG 12 hr tablet Take 1 tablet (500 mg total) by mouth 2 (two) times daily.   tetrahydrozoline 0.05 % ophthalmic solution Place 1 drop into both eyes daily as needed (dry eyes).   Vitamin D, Ergocalciferol, (DRISDOL) 1.25 MG (50000 UNIT) CAPS capsule Take 50,000 Units by mouth every Tuesday.   zolpidem (AMBIEN) 10 MG tablet Take 10 mg by mouth at bedtime.     Allergies:   Patient has no known allergies.   Social History   Socioeconomic History   Marital status: Married    Spouse name: Not on file   Number of children: Not on file   Years of education: Not on file   Highest education level: Not on file  Occupational History   Occupation: retired Interior and spatial designer  Tobacco Use   Smoking status: Former    Types: Cigarettes    Quit date: 11/07/1994    Years since quitting: 26.3   Smokeless tobacco: Never  Vaping Use   Vaping Use: Never used  Substance and Sexual  Activity   Alcohol use: Not Currently   Drug use: Never   Sexual activity: Yes  Other Topics Concern   Not on file  Social History Narrative   Not on file   Social Determinants of Health   Financial Resource Strain: Not on file  Food Insecurity: Not on file  Transportation Needs: Not on file  Physical Activity: Not on file  Stress: Not on file  Social Connections: Not on file     Family History: [name] family history includes Arthritis in his mother and sister; Diabetes in his maternal grandfather; Heart attack in his paternal grandfather; Hypertension in his maternal grandfather; Lymphoma in his brother; Pancreatic cancer in his brother. There is no history of Colon cancer, Colon polyps, Esophageal cancer, Rectal cancer, or Stomach cancer. ROS:   Please see the history of present illness.    All 14 point review of systems negative except as described per history of present illness  EKGs/Labs/Other Studies Reviewed:      Recent Labs: 05/27/2020: NT-Pro BNP  135 03/03/2021: BUN 36; Creatinine, Ser 1.58; Hemoglobin 12.4; Platelets 175; Potassium 5.3; Sodium 136  Recent Lipid Panel No results found for: CHOL, TRIG, HDL, CHOLHDL, VLDL, LDLCALC, LDLDIRECT  Physical Exam:    VS:  BP 110/64 (BP Location: Right Arm, Patient Position: Sitting)    Pulse 97    Ht 5\' 6"  (1.676 m)    Wt 201 lb (91.2 kg)    SpO2 98%    BMI 32.44 kg/m     Wt Readings from Last 3 Encounters:  03/11/21 201 lb (91.2 kg)  03/03/21 204 lb (92.5 kg)  12/09/20 209 lb (94.8 kg)     GEN:  Well nourished, well developed in no acute distress HEENT: Normal NECK: No JVD; No carotid bruits LYMPHATICS: No lymphadenopathy CARDIAC: RRR, no murmurs, no rubs, no gallops RESPIRATORY:  Clear to auscultation without rales, wheezing or rhonchi  ABDOMEN: Soft, non-tender, non-distended MUSCULOSKELETAL:  No edema; No deformity  SKIN: Warm and dry LOWER EXTREMITIES: no swelling NEUROLOGIC:  Alert and oriented x 3 PSYCHIATRIC:  Normal affect   ASSESSMENT:    1. Abnormal stress test   2. Dyslipidemia   3. Precordial chest pain   4. Essential hypertension   5. Type 2 diabetes mellitus with complication, without long-term current use of insulin (HCC)    PLAN:    In order of problems listed above:  Abnormal stress test only minimal abnormality involving basal portion of the inferior wall he favor medical therapy.  He have difficulty affording ranolazine. I want to switch him to Imdur however he preferred to go only with ranolazine once a day.  We will try that approach. Dyslipidemia I did review his K PN data from December show total cholesterol 112 HDL 50 he is on Lipitor 20 which I will continue. Diabetes.  I did review K PN last hemoglobin A1c 6.2 from March 03, 2021.  Good diabetic control Essential hypertension: Blood pressure well controlled continue present management.   Medication Adjustments/Labs and Tests Ordered: Current medicines are reviewed at length with the patient  today.  Concerns regarding medicines are outlined above.  No orders of the defined types were placed in this encounter.  Medication changes: No orders of the defined types were placed in this encounter.   Signed, Park Liter, MD, [facility] 03/11/2021 10:20 AM    [place]

## 2021-03-12 ENCOUNTER — Ambulatory Visit (HOSPITAL_COMMUNITY)
Admission: RE | Admit: 2021-03-12 | Discharge: 2021-03-12 | Disposition: A | Discharge status: Home / Self Care | Payer: Medicare Other | Source: Ambulatory Visit | Attending: Urology | Admitting: Urology

## 2021-03-12 ENCOUNTER — Encounter (HOSPITAL_COMMUNITY)
Admission: RE | Disposition: A | Discharge status: Home / Self Care | Payer: Self-pay | Source: Ambulatory Visit | Attending: Urology

## 2021-03-12 ENCOUNTER — Encounter (HOSPITAL_COMMUNITY): Payer: Self-pay | Admitting: Urology

## 2021-03-12 ENCOUNTER — Ambulatory Visit (HOSPITAL_COMMUNITY): Payer: Medicare Other

## 2021-03-12 ENCOUNTER — Ambulatory Visit (HOSPITAL_BASED_OUTPATIENT_CLINIC_OR_DEPARTMENT_OTHER): Payer: Medicare Other | Admitting: Anesthesiology

## 2021-03-12 ENCOUNTER — Ambulatory Visit (HOSPITAL_COMMUNITY): Payer: Medicare Other | Admitting: Anesthesiology

## 2021-03-12 DIAGNOSIS — E1122 Type 2 diabetes mellitus with diabetic chronic kidney disease: Secondary | ICD-10-CM | POA: Diagnosis not present

## 2021-03-12 DIAGNOSIS — Z87442 Personal history of urinary calculi: Secondary | ICD-10-CM | POA: Diagnosis not present

## 2021-03-12 DIAGNOSIS — C679 Malignant neoplasm of bladder, unspecified: Secondary | ICD-10-CM | POA: Diagnosis not present

## 2021-03-12 DIAGNOSIS — Z905 Acquired absence of kidney: Secondary | ICD-10-CM | POA: Insufficient documentation

## 2021-03-12 DIAGNOSIS — Z85528 Personal history of other malignant neoplasm of kidney: Secondary | ICD-10-CM | POA: Insufficient documentation

## 2021-03-12 DIAGNOSIS — I10 Essential (primary) hypertension: Secondary | ICD-10-CM

## 2021-03-12 DIAGNOSIS — M1990 Unspecified osteoarthritis, unspecified site: Secondary | ICD-10-CM | POA: Diagnosis not present

## 2021-03-12 DIAGNOSIS — N189 Chronic kidney disease, unspecified: Secondary | ICD-10-CM | POA: Diagnosis not present

## 2021-03-12 DIAGNOSIS — Z6832 Body mass index (BMI) 32.0-32.9, adult: Secondary | ICD-10-CM | POA: Insufficient documentation

## 2021-03-12 DIAGNOSIS — I129 Hypertensive chronic kidney disease with stage 1 through stage 4 chronic kidney disease, or unspecified chronic kidney disease: Secondary | ICD-10-CM | POA: Diagnosis not present

## 2021-03-12 DIAGNOSIS — C678 Malignant neoplasm of overlapping sites of bladder: Secondary | ICD-10-CM | POA: Diagnosis not present

## 2021-03-12 DIAGNOSIS — N3020 Other chronic cystitis without hematuria: Secondary | ICD-10-CM | POA: Diagnosis not present

## 2021-03-12 DIAGNOSIS — K279 Peptic ulcer, site unspecified, unspecified as acute or chronic, without hemorrhage or perforation: Secondary | ICD-10-CM

## 2021-03-12 DIAGNOSIS — D494 Neoplasm of unspecified behavior of bladder: Secondary | ICD-10-CM

## 2021-03-12 DIAGNOSIS — E669 Obesity, unspecified: Secondary | ICD-10-CM | POA: Insufficient documentation

## 2021-03-12 DIAGNOSIS — Z87891 Personal history of nicotine dependence: Secondary | ICD-10-CM | POA: Insufficient documentation

## 2021-03-12 DIAGNOSIS — E119 Type 2 diabetes mellitus without complications: Secondary | ICD-10-CM | POA: Diagnosis not present

## 2021-03-12 DIAGNOSIS — D090 Carcinoma in situ of bladder: Secondary | ICD-10-CM | POA: Diagnosis not present

## 2021-03-12 HISTORY — PX: TRANSURETHRAL RESECTION OF BLADDER TUMOR: SHX2575

## 2021-03-12 HISTORY — PX: CYSTOSCOPY W/ RETROGRADES: SHX1426

## 2021-03-12 LAB — GLUCOSE, CAPILLARY: Glucose-Capillary: 120 mg/dL — ABNORMAL HIGH (ref 70–99)

## 2021-03-12 LAB — BASIC METABOLIC PANEL
Anion gap: 8 (ref 5–15)
BUN: 34 mg/dL — ABNORMAL HIGH (ref 8–23)
CO2: 20 mmol/L — ABNORMAL LOW (ref 22–32)
Calcium: 9.3 mg/dL (ref 8.9–10.3)
Chloride: 107 mmol/L (ref 98–111)
Creatinine, Ser: 1.81 mg/dL — ABNORMAL HIGH (ref 0.61–1.24)
GFR, Estimated: 40 mL/min — ABNORMAL LOW (ref >60)
Glucose, Bld: 119 mg/dL — ABNORMAL HIGH (ref 70–99)
Potassium: 4.7 mmol/L (ref 3.5–5.1)
Sodium: 135 mmol/L (ref 135–145)

## 2021-03-12 SURGERY — TURBT (TRANSURETHRAL RESECTION OF BLADDER TUMOR)
Anesthesia: General | Site: Bladder | Laterality: Right

## 2021-03-12 MED ORDER — SULFAMETHOXAZOLE-TRIMETHOPRIM 800-160 MG PO TABS: 1.0000 | ORAL_TABLET | Freq: Every day | ORAL | 0 refills | Status: DC

## 2021-03-12 MED ORDER — GENTAMICIN SULFATE 40 MG/ML IJ SOLN
5.0000 mg/kg | INTRAVENOUS | Status: AC
  Administered 2021-03-12: 400 mg via INTRAVENOUS
  Filled 2021-03-12: qty 10

## 2021-03-12 MED ORDER — DEXAMETHASONE SODIUM PHOSPHATE 10 MG/ML IJ SOLN
INTRAMUSCULAR | Status: AC
  Filled 2021-03-12: qty 1

## 2021-03-12 MED ORDER — PHENYLEPHRINE 40 MCG/ML (10ML) SYRINGE FOR IV PUSH (FOR BLOOD PRESSURE SUPPORT)
PREFILLED_SYRINGE | INTRAVENOUS | Status: DC | PRN
  Administered 2021-03-12: 80 ug via INTRAVENOUS
  Administered 2021-03-12: 120 ug via INTRAVENOUS
  Administered 2021-03-12: 200 ug via INTRAVENOUS

## 2021-03-12 MED ORDER — FENTANYL CITRATE PF 50 MCG/ML IJ SOSY
25.0000 ug | PREFILLED_SYRINGE | INTRAMUSCULAR | Status: DC | PRN
  Administered 2021-03-12: 50 ug via INTRAVENOUS

## 2021-03-12 MED ORDER — HYDROMORPHONE HCL 1 MG/ML IJ SOLN
INTRAMUSCULAR | Status: AC
  Administered 2021-03-12: 0.5 mg via INTRAVENOUS
  Filled 2021-03-12: qty 1

## 2021-03-12 MED ORDER — FENTANYL CITRATE PF 50 MCG/ML IJ SOSY
PREFILLED_SYRINGE | INTRAMUSCULAR | Status: AC
  Administered 2021-03-12: 50 ug via INTRAVENOUS
  Filled 2021-03-12: qty 2

## 2021-03-12 MED ORDER — HYDROMORPHONE HCL 1 MG/ML IJ SOLN
0.5000 mg | INTRAMUSCULAR | Status: AC | PRN
  Administered 2021-03-12 (×2): 0.5 mg via INTRAVENOUS

## 2021-03-12 MED ORDER — OXYCODONE HCL 5 MG/5ML PO SOLN: 5.0000 mg | Freq: Once | ORAL | Status: AC | PRN

## 2021-03-12 MED ORDER — LACTATED RINGERS IV SOLN: INTRAVENOUS | Status: DC

## 2021-03-12 MED ORDER — OXYCODONE HCL 5 MG PO TABS
ORAL_TABLET | ORAL | Status: AC
  Filled 2021-03-12: qty 1

## 2021-03-12 MED ORDER — OXYCODONE-ACETAMINOPHEN 5-325 MG PO TABS: 1.0000 | ORAL_TABLET | Freq: Four times a day (QID) | ORAL | 0 refills | Status: AC | PRN

## 2021-03-12 MED ORDER — PROPOFOL 10 MG/ML IV BOLUS
INTRAVENOUS | Status: DC | PRN
  Administered 2021-03-12: 170 mg via INTRAVENOUS

## 2021-03-12 MED ORDER — FENTANYL CITRATE PF 50 MCG/ML IJ SOSY
PREFILLED_SYRINGE | INTRAMUSCULAR | Status: AC
  Administered 2021-03-12: 50 ug via INTRAVENOUS
  Filled 2021-03-12: qty 1

## 2021-03-12 MED ORDER — PROPOFOL 10 MG/ML IV BOLUS
INTRAVENOUS | Status: AC
  Filled 2021-03-12: qty 20

## 2021-03-12 MED ORDER — FENTANYL CITRATE (PF) 100 MCG/2ML IJ SOLN
INTRAMUSCULAR | Status: DC | PRN
  Administered 2021-03-12: 50 ug via INTRAVENOUS
  Administered 2021-03-12 (×2): 25 ug via INTRAVENOUS

## 2021-03-12 MED ORDER — IOHEXOL 300 MG/ML  SOLN
INTRAMUSCULAR | Status: DC | PRN
  Administered 2021-03-12: 10 mL

## 2021-03-12 MED ORDER — LIDOCAINE HCL (PF) 2 % IJ SOLN
INTRAMUSCULAR | Status: AC
  Filled 2021-03-12: qty 5

## 2021-03-12 MED ORDER — FENTANYL CITRATE (PF) 100 MCG/2ML IJ SOLN
INTRAMUSCULAR | Status: AC
  Filled 2021-03-12: qty 2

## 2021-03-12 MED ORDER — ONDANSETRON HCL 4 MG/2ML IJ SOLN
INTRAMUSCULAR | Status: AC
  Filled 2021-03-12: qty 2

## 2021-03-12 MED ORDER — ORAL CARE MOUTH RINSE
15.0000 mL | Freq: Once | OROMUCOSAL | Status: AC
Start: 2021-03-12 — End: 2021-03-12

## 2021-03-12 MED ORDER — ONDANSETRON HCL 4 MG/2ML IJ SOLN
INTRAMUSCULAR | Status: DC | PRN
  Administered 2021-03-12: 4 mg via INTRAVENOUS

## 2021-03-12 MED ORDER — DEXAMETHASONE SODIUM PHOSPHATE 10 MG/ML IJ SOLN
INTRAMUSCULAR | Status: DC | PRN
  Administered 2021-03-12: 10 mg via INTRAVENOUS

## 2021-03-12 MED ORDER — LIDOCAINE HCL (CARDIAC) PF 100 MG/5ML IV SOSY
PREFILLED_SYRINGE | INTRAVENOUS | Status: DC | PRN
  Administered 2021-03-12: 100 mg via INTRAVENOUS

## 2021-03-12 MED ORDER — ONDANSETRON HCL 4 MG/2ML IJ SOLN: 4.0000 mg | Freq: Four times a day (QID) | INTRAMUSCULAR | Status: DC | PRN

## 2021-03-12 MED ORDER — SODIUM CHLORIDE 0.9 % IR SOLN
Status: DC | PRN
Start: 2021-03-12 — End: 2021-03-12
  Administered 2021-03-12: 3000 mL

## 2021-03-12 MED ORDER — PHENYLEPHRINE 40 MCG/ML (10ML) SYRINGE FOR IV PUSH (FOR BLOOD PRESSURE SUPPORT)
PREFILLED_SYRINGE | INTRAVENOUS | Status: AC
  Filled 2021-03-12: qty 10

## 2021-03-12 MED ORDER — OXYCODONE HCL 5 MG PO TABS
5.0000 mg | ORAL_TABLET | Freq: Once | ORAL | Status: AC | PRN
  Administered 2021-03-12: 5 mg via ORAL

## 2021-03-12 MED ORDER — CHLORHEXIDINE GLUCONATE 0.12 % MT SOLN
15.0000 mL | Freq: Once | OROMUCOSAL | Status: AC
  Administered 2021-03-12: 15 mL via OROMUCOSAL

## 2021-03-12 SURGICAL SUPPLY — 24 items
BAG COUNTER SPONGE SURGICOUNT (BAG) IMPLANT
BAG DRN RND TRDRP ANRFLXCHMBR (UROLOGICAL SUPPLIES)
BAG SPNG CNTER NS LX DISP (BAG)
BAG URINE DRAIN 2000ML AR STRL (UROLOGICAL SUPPLIES) IMPLANT
BAG URO CATCHER STRL LF (MISCELLANEOUS) ×3 IMPLANT
CATH FOLEY 2WAY SLVR  5CC 18FR (CATHETERS) ×1
CATH FOLEY 2WAY SLVR 5CC 18FR (CATHETERS) IMPLANT
CATH URETL OPEN END 6FR 70 (CATHETERS) IMPLANT
CLOTH BEACON ORANGE TIMEOUT ST (SAFETY) ×3 IMPLANT
DRAPE FOOT SWITCH (DRAPES) ×3 IMPLANT
ELECT REM PT RETURN 15FT ADLT (MISCELLANEOUS) ×3 IMPLANT
EVACUATOR MICROVAS BLADDER (UROLOGICAL SUPPLIES) IMPLANT
GLOVE SURG ENC TEXT LTX SZ7.5 (GLOVE) ×3 IMPLANT
GOWN STRL REUS W/TWL LRG LVL3 (GOWN DISPOSABLE) ×6 IMPLANT
GUIDEWIRE STR DUAL SENSOR (WIRE) ×3 IMPLANT
KIT TURNOVER KIT A (KITS) IMPLANT
LOOP CUT BIPOLAR 24F LRG (ELECTROSURGICAL) IMPLANT
MANIFOLD NEPTUNE II (INSTRUMENTS) ×3 IMPLANT
NS IRRIG 1000ML POUR BTL (IV SOLUTION) IMPLANT
PACK CYSTO (CUSTOM PROCEDURE TRAY) ×3 IMPLANT
SYR TOOMEY IRRIG 70ML (MISCELLANEOUS)
SYRINGE TOOMEY IRRIG 70ML (MISCELLANEOUS) IMPLANT
TUBING CONNECTING 10 (TUBING) ×3 IMPLANT
TUBING UROLOGY SET (TUBING) ×3 IMPLANT

## 2021-03-12 NOTE — Anesthesia Preprocedure Evaluation (Signed)
Anesthesia Evaluation  ?Patient identified by MRN, date of birth, ID band ?Patient awake ? ? ? ?Reviewed: ?Allergy & Precautions, H&P , NPO status , Patient's Chart, lab work & pertinent test results ? ?Airway ?Mallampati: II ? ? ?Neck ROM: full ? ? ? Dental ?  ?Pulmonary ?former smoker,  ?  ?breath sounds clear to auscultation ? ? ? ? ? ? Cardiovascular ?hypertension,  ?Rhythm:regular Rate:Normal ? ? ?  ?Neuro/Psych ?  ? GI/Hepatic ?hiatal hernia, PUD, GERD  ,  ?Endo/Other  ?diabetes, Type 2 ? Renal/GU ?Renal InsufficiencyRenal diseasestones  ? ?  ?Musculoskeletal ? ?(+) Arthritis ,  ? Abdominal ?  ?Peds ? Hematology ?  ?Anesthesia Other Findings ? ? Reproductive/Obstetrics ? ?  ? ? ? ? ? ? ? ? ? ? ? ? ? ?  ?  ? ? ? ? ? ? ? ? ?Anesthesia Physical ?Anesthesia Plan ? ?ASA: 3 ? ?Anesthesia Plan: General  ? ?Post-op Pain Management:   ? ?Induction: Intravenous ? ?PONV Risk Score and Plan: 2 and Ondansetron, Dexamethasone, Treatment may vary due to age or medical condition and Midazolam ? ?Airway Management Planned: LMA ? ?Additional Equipment:  ? ?Intra-op Plan:  ? ?Post-operative Plan: Extubation in OR ? ?Informed Consent: I have reviewed the patients History and Physical, chart, labs and discussed the procedure including the risks, benefits and alternatives for the proposed anesthesia with the patient or authorized representative who has indicated his/her understanding and acceptance.  ? ? ? ?Dental advisory given ? ?Plan Discussed with: CRNA, Surgeon and Anesthesiologist ? ?Anesthesia Plan Comments:   ? ? ? ? ? ? ?Anesthesia Quick Evaluation ? ?

## 2021-03-12 NOTE — Brief Op Note (Signed)
03/12/2021 ? ?4:03 PM ? ?PATIENT:  Frank Ware  71 y.o. male ? ?PRE-OPERATIVE DIAGNOSIS:  RECURRENT BLADDER CANCER ? ?POST-OPERATIVE DIAGNOSIS:  RECURRENT BLADDER CANCER ? ?PROCEDURE:  Procedure(s) with comments: ?TRANSURETHRAL RESECTION OF BLADDER TUMOR (TURBT) (N/A) - 1 HR ?CYSTOSCOPY WITH RETROGRADE PYELOGRAM (Right) ? ?SURGEON:  Surgeon(s) and Role: ?   Alexis Frock, MD - Primary ? ?PHYSICIAN ASSISTANT:  ? ?ASSISTANTS: none  ? ?ANESTHESIA:   general ? ?EBL:  17mL  ? ?BLOOD ADMINISTERED:none ? ?DRAINS:  43F foley to gravity   ? ?LOCAL MEDICATIONS USED:  NONE ? ?SPECIMEN:  Source of Specimen:  1 - bladder tumor; 2- base of bladder tumor ? ?DISPOSITION OF SPECIMEN:  PATHOLOGY ? ?COUNTS:  YES ? ?TOURNIQUET:  * No tourniquets in log * ? ?DICTATION: .[id number] ? ?PLAN OF CARE: Discharge to home after PACU ? ?PATIENT DISPOSITION:  PACU - hemodynamically stable. ?  ?Delay start of Pharmacological VTE agent (>24hrs) due to surgical blood loss or risk of bleeding: yes ? ?

## 2021-03-12 NOTE — Discharge Instructions (Signed)
1 - You may have urinary urgency (bladder spasms) and bloody urine on / off for up to 2 weeks.  This is normal.  2 - Call MD or go to ER for fever >102, severe pain / nausea / vomiting not relieved by medications, or acute change in medical status  

## 2021-03-12 NOTE — H&P (Signed)
Frank Ware is an 71 y.o. male.   ? ?Chief Complaint: Pre-OP Transurethral Resection of Bladder TUmor ? ?HPI:  ? ?1 - Multifocal Urothelial Carcinoma / Left Renal Pelvis Cancer / High Grade Bladder Cancer -  ?08/2020 - Large volume left renal pelvis + bladder tumor (T1G3) by TURBT / Left U-Scope on eval hematuria and bladder mass. 1 artery / 1 vein left renovascular anatomy. CT localized, Cr <1.5.  ?09/2020 - Restaging TURBT (no residual bladder cancer), large voluem left renal pelvix cancer G3, Rt kidney + bilateral ureters grossly negative.  ?11/2020 - Left nephro-ureterectomy, path TaG3 with negative margins  ? ?Recent Course:  ?02/2021 - cysto multifocal bladder tumor recurrence (bladder neck, dome, posterior, 6cm total surface area)  ? ?2 - Recurrent Urolithiasis - years of recurrent left sided stones req procedureal intervention x several. Never right sided. CT 2022 with some patchy left papillary tip calcs no right sided stones.  ? ?3 - Solitary Right Kidney - s/p left nephrectomy for cancer 2022. Cr <1.5 with solitary kidney.  ? ?PMH sig for obesity, DM2, HTN, Ware risk stress test (follows [facility] in [place]). His PCP is Williams Che Ware with [facility] in [place]. He is retired from [employer], retried in [place] to be clsoe to family. His wife Frank Ware is very involved.  ? ?Today "[name]" is seen to proceed with TURBT / Rt retrograde for recurrent large voluem bladder cancer. No interval fevers. Most recent UA without infectious parameters.  ? ? ?Past Medical History:  ?Diagnosis Date  ? Abnormal stress test 12/03/2019  ? Acute pain 12/27/2018  ? Anginal pain ([employer])   ? Arthritis   ? Arthropathy of cervical facet joint 12/14/2016  ? Bilateral occipital neuralgia 10/13/2016  ? Body mass index 34.0-34.9, adult 09/02/2013  ? BPPV (benign paroxysmal positional vertigo)   ? Cancer [facility])   ? Cervical pseudoarthrosis, sequela 02/07/2017  ? Cervical spinal stenosis 10/13/2016  ? Chronic kidney  disease   ? Chronic pain syndrome 10/13/2016  ? Diastolic dysfunction 84/66/5993  ? Duodenal ulcer   ? Non-bleeding on EGD, March 2021  ? Dyslipidemia 11/18/2019  ? Encounter for therapeutic drug monitoring 10/13/2016  ? Essential hypertension 11/18/2019  ? Hiatal hernia   ? High cholesterol   ? History of COVID-19 06/2019  ? History of kidney stones   ? Hypertension   ? Postlaminectomy syndrome of cervical region 10/13/2016  ? Precordial chest pain 11/18/2019  ? Presence of right artificial knee joint 10/13/2016  ? Trochanteric bursitis of right hip 10/13/2016  ? Type 2 diabetes mellitus with complication, without long-term current use of insulin ([place]) 11/18/2019  ? Type 2 diabetes mellitus with hyperglycemia, without long-term current use of insulin ([place])   ? Vitamin D deficiency   ? ? ?Past Surgical History:  ?Procedure Laterality Date  ? BLADDER TUMOR EXCISION    ? CERVICAL FUSION    ? C3-C4 1992 and C5-C6 2001.  ? CERVICAL FUSION    ? x 2vc  ? COLONOSCOPY    ? CYSTOSCOPY N/A 11/13/2020  ? Procedure: CYSTOSCOPY;  Surgeon: Alexis Frock, Ware;  [facility];  Service: Urology;  Laterality: N/A;  ? CYSTOSCOPY/RETROGRADE/URETEROSCOPY Bilateral 09/25/2020  ? Procedure: CYSTOSCOPY/BILATERAL RETROGRADE/ RIGHT DIAGNOSTIC URETEROSCOPY/ LEFT URETERSOSCOPY WITH BIOPSY AND STENT;  Surgeon: Alexis Frock, Ware;  [facility];  Service: Urology;  Laterality: Bilateral;  ? CYSTOSCOPY/URETEROSCOPY/HOLMIUM LASER/STENT PLACEMENT Left 11/22/2018  ? Procedure: CYSTOSCOPY LEFT URETEROSCOPY/HOLMIUM LASER/STENT EXCHANGE;  Surgeon: Irine Seal,  Ware;  Location: [facility];  Service: Urology;  Laterality: Left;  ? CYSTOSCOPY/URETEROSCOPY/HOLMIUM LASER/STENT PLACEMENT Left 12/25/2018  ? Procedure: CYSTOSCOPY LEFT RETROGRADE PYELOGRAM LEFT URETEROSCOPY WITH HOLMIUM LASER LEFT STENT EXCHANGE;  Surgeon: Irine Seal, Ware;  Location: [facility];  Service: Urology;  Laterality: Left;  ? KNEE ARTHROPLASTY  Right   ? KNEE ARTHROSCOPY Bilateral 1985  ? left knee arthroscopy     ? LITHOTRIPSY    ? right kne e replacement     ? ROBOT ASSITED LAPAROSCOPIC NEPHROURETERECTOMY Left 11/13/2020  ? Procedure: XI ROBOT ASSITED LAPAROSCOPIC NEPHROURETERECTOMY;  Surgeon: Alexis Frock, Ware;  [facility];  Service: Urology;  Laterality: Left;  3 HRS  ? TONSILLECTOMY    ? TRANSURETHRAL RESECTION OF BLADDER TUMOR N/A 09/25/2020  ? Procedure: RESTAGING TRANSURETHRAL RESECTION OF BLADDER TUMOR (TURBT);  Surgeon: Alexis Frock, Ware;  [facility];  Service: Urology;  Laterality: N/A;  ? TRANSURETHRAL RESECTION OF BLADDER TUMOR WITH MITOMYCIN-C Bilateral 08/17/2020  ? Procedure: TRANSURETHRAL RESECTION OF BLADDER TUMOR , CYSTOSCOPY BILATERAL RETROGRADES;  Surgeon: Irine Seal, Ware;  [facility];  Service: Urology;  Laterality: Bilateral;  ? UPPER GI ENDOSCOPY    ? ? ?Family History  ?Problem Relation Age of Onset  ? Lymphoma Brother   ? Pancreatic cancer Brother   ? Diabetes Maternal Grandfather   ? Hypertension Maternal Grandfather   ? Arthritis Mother   ? Arthritis Sister   ? Heart attack Paternal Grandfather   ? Colon cancer Neg Hx   ? Colon polyps Neg Hx   ? Esophageal cancer Neg Hx   ? Rectal cancer Neg Hx   ? Stomach cancer Neg Hx   ? ?Social History:  reports that he quit smoking about 26 years ago. His smoking use included cigarettes. He has never used smokeless tobacco. He reports that he does not currently use alcohol. He reports that he does not use drugs. ? ?Allergies: No Known Allergies ? ?No medications prior to admission.  ? ? ?No results found for this or any previous visit (from the past 48 hour(s)). ?No results found. ? ?Review of Systems  ?Constitutional:  Negative for chills and fever.  ?All other systems reviewed and are negative. ? ?There were no vitals taken for this visit. ?Physical Exam ?Vitals reviewed.  ?HENT:  ?   Head: Normocephalic.  ?   Nose: Nose normal.  ?   Mouth/Throat:  ?   Mouth: Mucous  membranes are moist.  ?Cardiovascular:  ?   Rate and Rhythm: Normal rate.  ?Pulmonary:  ?   Effort: Pulmonary effort is normal.  ?Abdominal:  ?   General: Abdomen is flat.  ?   Comments: Prior scars w/o hernias.   ?Genitourinary: ?   Comments: No CVAT at present.  ?Musculoskeletal:  ?   Cervical back: Normal range of motion.  ?Neurological:  ?   General: No focal deficit present.  ?   Mental Status: He is alert.  ?  ? ?Assessment/Plan ? ?Proceed as planned with TURBT / Rt retrograde. Risks, benefits, alternatives, expected peri-op course (including possible temporary foley) discussed previously and reiterated today ? ?Alexis Frock, Ware ?03/12/2021, 7:28 AM ? ? ? ?

## 2021-03-12 NOTE — Anesthesia Procedure Notes (Signed)
Procedure Name: LMA Insertion ?Date/Time: 03/12/2021 3:25 PM ?Performed by: Lind Covert, CRNA ?Pre-anesthesia Checklist: Patient identified, Emergency Drugs available, Suction available and Patient being monitored ?Patient Re-evaluated:Patient Re-evaluated prior to induction ?Oxygen Delivery Method: Circle system utilized ?Preoxygenation: Pre-oxygenation with 100% oxygen ?Induction Type: IV induction ?LMA: LMA inserted ?LMA Size: 5.0 ?Tube type: Oral ?Number of attempts: 1 ?Placement Confirmation: positive ETCO2 and breath sounds checked- equal and bilateral ?Tube secured with: Tape ?Dental Injury: Teeth and Oropharynx as per pre-operative assessment  ? ? ? ? ?

## 2021-03-12 NOTE — Transfer of Care (Signed)
Immediate Anesthesia Transfer of Care Note ? ?Patient: Frank Ware ? ?Procedure(s) Performed: TRANSURETHRAL RESECTION OF BLADDER TUMOR (TURBT) (Bladder) ?CYSTOSCOPY WITH RETROGRADE PYELOGRAM (Right: Bladder) ? ?Patient Location: PACU ? ?Anesthesia Type:General ? ?Level of Consciousness: awake, alert  and oriented ? ?Airway & Oxygen Therapy: Patient Spontanous Breathing and Patient connected to face mask ? ?Post-op Assessment: Report given to RN and Post -op Vital signs reviewed and stable ? ?Post vital signs: Reviewed and stable ? ?Last Vitals:  ?Vitals Value Taken Time  ?BP 105/78 03/12/21 1615  ?Temp    ?Pulse 91 03/12/21 1622  ?Resp 15 03/12/21 1622  ?SpO2 97 % 03/12/21 1622  ?Vitals shown include unvalidated device data. ? ?Last Pain:  ?Vitals:  ? 03/12/21 1343  ?TempSrc:   ?PainSc: 6   ?   ? ?Patients Stated Pain Goal: 4 (03/12/21 1343) ? ?Complications: No notable events documented. ?

## 2021-03-13 NOTE — Op Note (Signed)
NAME: Frank Ware, Frank A. ?MEDICAL RECORD NO: [id number] ?ACCOUNT NO: [id number] ?DATE OF BIRTH: Apr 12, 1950 ?FACILITY: [facility] ?LOCATION: [facility] ?PHYSICIAN: Alexis Frock, MD ? ?Operative Report  ? ?DATE OF PROCEDURE: 03/12/2021 ? ?PREOPERATIVE DIAGNOSIS:  Recurrent bladder cancer. ? ?PROCEDURE: ?1.  Transurethral resection of bladder tumor, volume large. ?2.  Right retrograde pyelogram and interpretation. ? ?ESTIMATED BLOOD LOSS:  20 mL. ? ?COMPLICATION:  None. ? ?SPECIMENS: ?1.  Bladder tumor. ?2.  Base of bladder tumor for permanent pathology. ? ?FINDINGS:  ?1.  Surgically absent left ureteral orifice. ?2.  Unremarkable right retrograde pyelogram. ?3.  Large volume recurrent bladder tumor, mostly dome, bladder neck.  Total surface area approximately 8 cm. ?4.  Significant concern for muscle invasive and prostate invasive disease. ? ?INDICATIONS:  The patient is a pleasant 71 year old man with history of recurrent multifocal urothelial carcinoma.  He is status post left nephroureterectomy. On bladder surveillance, he was found to have large recurrence of bladder tumor recently.   ?Options were discussed including recommended path of transurethral resection for diagnostic and staging purposes and he wished to proceed.  Informed consent was obtained and placed in medical record. ? ?PROCEDURE IN DETAIL:  The patient being verified, procedure being right retrograde pyelogram and transurethral resection of bladder tumor was confirmed.  Procedure timeout was performed.  Intravenous antibiotics were administered.  General anesthesia was ? introduced.  The patient was placed into a low lithotomy position.  Sterile field was created, prepped and draped the patient's penis, perineum, and proximal thighs using iodine.  Cystourethroscopy was performed using 21-French rigid cystoscope with  ?offset lens.  Inspection of anterior and posterior urethra did reveal some papillary tumor at the bladder neck, prostate junction.  Inspection  of urinary bladder revealed large volume multifocal papillary bladder tumor, mostly at the posterior wall, dome ? and then again at the bladder neck.  The right ureteral orifice was visibly patent and spared from tumor.  The left ureteral orifice was surgically absent.  The right ureteral orifice was cannulated with 6-French end-hole catheter, and right retrograde  ?pyelogram was obtained. ? ?Right retrograde pyelogram demonstrated single right ureter, single system right kidney.  No filling defects or narrowing noted.  Cystoscope was then exchanged for 26-French resectoscope sheath with visual obturator and using medium resectoscope loop,  ?very careful resection was performed down to superficial fibromuscular stroma of the urinary bladder of all foci of tumor.  Again, exquisite care was taken to avoid any injury to the solitary right ureteral orifice.  This did not occur.  The base of this ? area was copiously fulgurated.  Attention was directed at the bladder neck tumor.  This was carefully dissected down using loop bipolar energy.  This appeared to questionably involve the prostatic stroma.  All these tissue was set aside labeled as  ?bladder tumor.  Cold cup biopsy forceps were then used to obtain representative seromuscular bites from the posterior wall, dome, area of tumor, set aside labeled as base of bladder tumor.  The entire base resection area was then fulgurated.  Hemostasis  ?was excellent.  Given the large area of resection, it was felt that brief interval catheterization would be most prudent and an 18-French Foley catheter was placed per urethra to straight drain, 10 mL sterile water in the balloon.  Procedure was  ?terminated.  The patient tolerated the procedure well and no immediate perioperative complications.  The patient was taken to postanesthesia care in stable condition.  Plan for discharge home.  He will  have trial of void in the office next week. ? ? ?SHW ?D: 03/12/2021 4:08:18 pm T:  03/13/2021 12:29:00 am  ?JOB: [id number]/ [id number]  ?

## 2021-03-14 NOTE — Anesthesia Postprocedure Evaluation (Signed)
Anesthesia Post Note ? ?Patient: Frank Ware ? ?Procedure(s) Performed: TRANSURETHRAL RESECTION OF BLADDER TUMOR (TURBT) (Bladder) ?CYSTOSCOPY WITH RETROGRADE PYELOGRAM (Right: Bladder) ? ?  ? ?Patient location during evaluation: PACU ?Anesthesia Type: General ?Level of consciousness: awake and alert ?Pain management: pain level controlled ?Vital Signs Assessment: post-procedure vital signs reviewed and stable ?Respiratory status: spontaneous breathing, nonlabored ventilation, respiratory function stable and patient connected to nasal cannula oxygen ?Cardiovascular status: blood pressure returned to baseline and stable ?Postop Assessment: no apparent nausea or vomiting ?Anesthetic complications: no ? ? ?No notable events documented. ? ?Last Vitals:  ?Vitals:  ? 03/12/21 1730 03/12/21 1800  ?BP: 123/89 122/88  ?Pulse: 79 80  ?Resp: 16 20  ?Temp:  36.4 ?C  ?SpO2: 94%   ?  ?Last Pain:  ?Vitals:  ? 03/12/21 1730  ?TempSrc:   ?PainSc: 3   ? ? ?  ?  ?  ?  ?  ?  ? ?Sasser S ? ? ? ? ?

## 2021-03-15 ENCOUNTER — Encounter (HOSPITAL_COMMUNITY): Payer: Self-pay | Admitting: Urology

## 2021-03-15 DIAGNOSIS — E1169 Type 2 diabetes mellitus with other specified complication: Secondary | ICD-10-CM | POA: Diagnosis not present

## 2021-03-15 DIAGNOSIS — E785 Hyperlipidemia, unspecified: Secondary | ICD-10-CM | POA: Diagnosis not present

## 2021-03-15 DIAGNOSIS — E782 Mixed hyperlipidemia: Secondary | ICD-10-CM | POA: Diagnosis not present

## 2021-03-16 DIAGNOSIS — E782 Mixed hyperlipidemia: Secondary | ICD-10-CM | POA: Diagnosis not present

## 2021-03-16 LAB — SURGICAL PATHOLOGY

## 2021-03-17 DIAGNOSIS — C678 Malignant neoplasm of overlapping sites of bladder: Secondary | ICD-10-CM | POA: Diagnosis not present

## 2021-03-24 DIAGNOSIS — I25119 Atherosclerotic heart disease of native coronary artery with unspecified angina pectoris: Secondary | ICD-10-CM | POA: Diagnosis not present

## 2021-03-24 DIAGNOSIS — I201 Angina pectoris with documented spasm: Secondary | ICD-10-CM | POA: Diagnosis not present

## 2021-03-24 DIAGNOSIS — I110 Hypertensive heart disease with heart failure: Secondary | ICD-10-CM | POA: Diagnosis not present

## 2021-03-24 DIAGNOSIS — E1169 Type 2 diabetes mellitus with other specified complication: Secondary | ICD-10-CM | POA: Diagnosis not present

## 2021-03-24 DIAGNOSIS — C678 Malignant neoplasm of overlapping sites of bladder: Secondary | ICD-10-CM | POA: Diagnosis not present

## 2021-03-24 DIAGNOSIS — I5032 Chronic diastolic (congestive) heart failure: Secondary | ICD-10-CM | POA: Diagnosis not present

## 2021-03-24 DIAGNOSIS — E785 Hyperlipidemia, unspecified: Secondary | ICD-10-CM | POA: Diagnosis not present

## 2021-03-24 DIAGNOSIS — C652 Malignant neoplasm of left renal pelvis: Secondary | ICD-10-CM | POA: Diagnosis not present

## 2021-03-24 DIAGNOSIS — D692 Other nonthrombocytopenic purpura: Secondary | ICD-10-CM | POA: Diagnosis not present

## 2021-03-24 DIAGNOSIS — N138 Other obstructive and reflux uropathy: Secondary | ICD-10-CM | POA: Diagnosis not present

## 2021-03-24 DIAGNOSIS — E782 Mixed hyperlipidemia: Secondary | ICD-10-CM | POA: Diagnosis not present

## 2021-03-25 DIAGNOSIS — S129XXS Fracture of neck, unspecified, sequela: Secondary | ICD-10-CM | POA: Diagnosis not present

## 2021-03-25 DIAGNOSIS — Z79899 Other long term (current) drug therapy: Secondary | ICD-10-CM | POA: Diagnosis not present

## 2021-03-25 DIAGNOSIS — M961 Postlaminectomy syndrome, not elsewhere classified: Secondary | ICD-10-CM | POA: Diagnosis not present

## 2021-03-25 DIAGNOSIS — G894 Chronic pain syndrome: Secondary | ICD-10-CM | POA: Diagnosis not present

## 2021-03-29 DIAGNOSIS — C678 Malignant neoplasm of overlapping sites of bladder: Secondary | ICD-10-CM | POA: Diagnosis not present

## 2021-03-29 DIAGNOSIS — C652 Malignant neoplasm of left renal pelvis: Secondary | ICD-10-CM | POA: Diagnosis not present

## 2021-04-09 DIAGNOSIS — N1832 Chronic kidney disease, stage 3b: Secondary | ICD-10-CM | POA: Diagnosis not present

## 2021-04-12 DIAGNOSIS — N1832 Chronic kidney disease, stage 3b: Secondary | ICD-10-CM | POA: Diagnosis not present

## 2021-04-12 DIAGNOSIS — Z905 Acquired absence of kidney: Secondary | ICD-10-CM | POA: Diagnosis not present

## 2021-04-12 DIAGNOSIS — C678 Malignant neoplasm of overlapping sites of bladder: Secondary | ICD-10-CM | POA: Diagnosis not present

## 2021-04-12 DIAGNOSIS — E875 Hyperkalemia: Secondary | ICD-10-CM | POA: Diagnosis not present

## 2021-04-13 DIAGNOSIS — C678 Malignant neoplasm of overlapping sites of bladder: Secondary | ICD-10-CM | POA: Diagnosis not present

## 2021-04-13 DIAGNOSIS — Z5111 Encounter for antineoplastic chemotherapy: Secondary | ICD-10-CM | POA: Diagnosis not present

## 2021-04-20 DIAGNOSIS — C678 Malignant neoplasm of overlapping sites of bladder: Secondary | ICD-10-CM | POA: Diagnosis not present

## 2021-04-20 DIAGNOSIS — Z5111 Encounter for antineoplastic chemotherapy: Secondary | ICD-10-CM | POA: Diagnosis not present

## 2021-04-27 DIAGNOSIS — Z5111 Encounter for antineoplastic chemotherapy: Secondary | ICD-10-CM | POA: Diagnosis not present

## 2021-04-27 DIAGNOSIS — C678 Malignant neoplasm of overlapping sites of bladder: Secondary | ICD-10-CM | POA: Diagnosis not present

## 2021-05-04 DIAGNOSIS — Z5111 Encounter for antineoplastic chemotherapy: Secondary | ICD-10-CM | POA: Diagnosis not present

## 2021-05-04 DIAGNOSIS — C678 Malignant neoplasm of overlapping sites of bladder: Secondary | ICD-10-CM | POA: Diagnosis not present

## 2021-05-11 DIAGNOSIS — C678 Malignant neoplasm of overlapping sites of bladder: Secondary | ICD-10-CM | POA: Diagnosis not present

## 2021-05-11 DIAGNOSIS — Z5111 Encounter for antineoplastic chemotherapy: Secondary | ICD-10-CM | POA: Diagnosis not present

## 2021-05-18 DIAGNOSIS — C678 Malignant neoplasm of overlapping sites of bladder: Secondary | ICD-10-CM | POA: Diagnosis not present

## 2021-05-18 DIAGNOSIS — Z5111 Encounter for antineoplastic chemotherapy: Secondary | ICD-10-CM | POA: Diagnosis not present

## 2021-05-19 DIAGNOSIS — M1712 Unilateral primary osteoarthritis, left knee: Secondary | ICD-10-CM | POA: Diagnosis not present

## 2021-05-20 DIAGNOSIS — N1832 Chronic kidney disease, stage 3b: Secondary | ICD-10-CM | POA: Diagnosis not present

## 2021-05-20 DIAGNOSIS — E559 Vitamin D deficiency, unspecified: Secondary | ICD-10-CM | POA: Diagnosis not present

## 2021-05-21 DIAGNOSIS — R310 Gross hematuria: Secondary | ICD-10-CM | POA: Diagnosis not present

## 2021-05-25 DIAGNOSIS — R310 Gross hematuria: Secondary | ICD-10-CM | POA: Diagnosis not present

## 2021-05-25 DIAGNOSIS — C678 Malignant neoplasm of overlapping sites of bladder: Secondary | ICD-10-CM | POA: Diagnosis not present

## 2021-05-25 DIAGNOSIS — S41111A Laceration without foreign body of right upper arm, initial encounter: Secondary | ICD-10-CM | POA: Diagnosis not present

## 2021-05-25 DIAGNOSIS — D500 Iron deficiency anemia secondary to blood loss (chronic): Secondary | ICD-10-CM | POA: Diagnosis not present

## 2021-05-25 DIAGNOSIS — Z905 Acquired absence of kidney: Secondary | ICD-10-CM | POA: Diagnosis not present

## 2021-05-27 DIAGNOSIS — Z7984 Long term (current) use of oral hypoglycemic drugs: Secondary | ICD-10-CM | POA: Diagnosis not present

## 2021-05-27 DIAGNOSIS — B351 Tinea unguium: Secondary | ICD-10-CM | POA: Diagnosis not present

## 2021-05-27 DIAGNOSIS — E119 Type 2 diabetes mellitus without complications: Secondary | ICD-10-CM | POA: Diagnosis not present

## 2021-05-27 DIAGNOSIS — J069 Acute upper respiratory infection, unspecified: Secondary | ICD-10-CM | POA: Diagnosis not present

## 2021-05-27 DIAGNOSIS — J029 Acute pharyngitis, unspecified: Secondary | ICD-10-CM | POA: Diagnosis not present

## 2021-05-31 DIAGNOSIS — R110 Nausea: Secondary | ICD-10-CM | POA: Diagnosis not present

## 2021-05-31 DIAGNOSIS — C649 Malignant neoplasm of unspecified kidney, except renal pelvis: Secondary | ICD-10-CM | POA: Diagnosis not present

## 2021-05-31 DIAGNOSIS — Z79899 Other long term (current) drug therapy: Secondary | ICD-10-CM | POA: Diagnosis not present

## 2021-06-11 DIAGNOSIS — G894 Chronic pain syndrome: Secondary | ICD-10-CM | POA: Diagnosis not present

## 2021-06-11 DIAGNOSIS — Z79899 Other long term (current) drug therapy: Secondary | ICD-10-CM | POA: Diagnosis not present

## 2021-06-11 DIAGNOSIS — M961 Postlaminectomy syndrome, not elsewhere classified: Secondary | ICD-10-CM | POA: Diagnosis not present

## 2021-06-11 DIAGNOSIS — S129XXS Fracture of neck, unspecified, sequela: Secondary | ICD-10-CM | POA: Diagnosis not present

## 2021-06-24 DIAGNOSIS — M961 Postlaminectomy syndrome, not elsewhere classified: Secondary | ICD-10-CM | POA: Diagnosis not present

## 2021-06-24 DIAGNOSIS — S129XXS Fracture of neck, unspecified, sequela: Secondary | ICD-10-CM | POA: Diagnosis not present

## 2021-06-24 DIAGNOSIS — G894 Chronic pain syndrome: Secondary | ICD-10-CM | POA: Diagnosis not present

## 2021-07-05 DIAGNOSIS — C678 Malignant neoplasm of overlapping sites of bladder: Secondary | ICD-10-CM | POA: Diagnosis not present

## 2021-08-09 DIAGNOSIS — I5032 Chronic diastolic (congestive) heart failure: Secondary | ICD-10-CM | POA: Diagnosis not present

## 2021-08-09 DIAGNOSIS — N1832 Chronic kidney disease, stage 3b: Secondary | ICD-10-CM | POA: Diagnosis not present

## 2021-08-18 ENCOUNTER — Encounter: Payer: Self-pay | Admitting: Cardiology

## 2021-08-18 ENCOUNTER — Ambulatory Visit: Payer: Medicare Other | Admitting: Cardiology

## 2021-08-18 VITALS — BP 110/80 | HR 85 | Ht 66.0 in | Wt 207.0 lb

## 2021-08-18 DIAGNOSIS — I10 Essential (primary) hypertension: Secondary | ICD-10-CM

## 2021-08-18 DIAGNOSIS — E785 Hyperlipidemia, unspecified: Secondary | ICD-10-CM

## 2021-08-18 DIAGNOSIS — I25118 Atherosclerotic heart disease of native coronary artery with other forms of angina pectoris: Secondary | ICD-10-CM

## 2021-08-18 DIAGNOSIS — E118 Type 2 diabetes mellitus with unspecified complications: Secondary | ICD-10-CM

## 2021-08-18 DIAGNOSIS — C679 Malignant neoplasm of bladder, unspecified: Secondary | ICD-10-CM | POA: Diagnosis not present

## 2021-08-18 MED ORDER — ISOSORBIDE MONONITRATE ER 30 MG PO TB24: 30.0000 mg | ORAL_TABLET | Freq: Every day | ORAL | 3 refills | Status: DC

## 2021-08-18 NOTE — Progress Notes (Signed)
Cardiology Office Note:    Date:  08/18/2021   ID:  Frank Ware, DOB 1950/07/21, MRN [id number]  PCP:  Street, Frank Mt, MD  Cardiologist:  Frank Campus, MD    Referring MD: Street, Frank Ware, *   Chief Complaint  Patient presents with   Follow-up  I am doing well  History of Present Illness:    Frank Ware is a 71 y.o. male with past medical history significant for minimal abnormal stress test involving basal portion of the inferior wall he favors medical therapy, dyslipidemia, diabetes, essential hypertension, bladder cancer, stable surgery including nephrectomy. He is in my office today for follow-up doing well overall.  Denies have any chest pain tightness squeezing pressure burning chest.  He did have maybe 1 or 2 episode of chest pain since he seen me last time it happened at rest when he was standing in the kitchen.  Interestingly he is able to walk around his property and do a lot of work outside have no difficulty doing it overall he describes the fact that he feels well.  Past Medical History:  Diagnosis Date   Abnormal stress test 12/03/2019   Acute pain 12/27/2018   Anginal pain (HCC)    Arthritis    Arthropathy of cervical facet joint 12/14/2016   Bilateral occipital neuralgia 10/13/2016   Body mass index 34.0-34.9, adult 09/02/2013   BPPV (benign paroxysmal positional vertigo)    Cancer (HCC)    Cervical pseudoarthrosis, sequela 02/07/2017   Cervical spinal stenosis 10/13/2016   Chronic kidney disease    Chronic pain syndrome 24/58/0998   Diastolic dysfunction 33/82/5053   Duodenal ulcer    Non-bleeding on EGD, March 2021   Dyslipidemia 11/18/2019   Encounter for therapeutic drug monitoring 10/13/2016   Essential hypertension 11/18/2019   Hiatal hernia    High cholesterol    History of COVID-19 06/2019   History of kidney stones    Hypertension    Postlaminectomy syndrome of cervical region 10/13/2016   Precordial chest pain 11/18/2019    Presence of right artificial knee joint 10/13/2016   Trochanteric bursitis of right hip 10/13/2016   Type 2 diabetes mellitus with complication, without long-term current use of insulin ([place]) 11/18/2019   Type 2 diabetes mellitus with hyperglycemia, without long-term current use of insulin ([place])    Vitamin D deficiency     Past Surgical History:  Procedure Laterality Date   BLADDER TUMOR EXCISION     CERVICAL FUSION     C3-C4 1992 and C5-C6 2001.   CERVICAL FUSION     x 2vc   COLONOSCOPY     CYSTOSCOPY N/A 11/13/2020   Procedure: CYSTOSCOPY;  Surgeon: Frank Frock, MD;  Location: [facility];  Service: Urology;  Laterality: N/A;   CYSTOSCOPY W/ RETROGRADES Right 03/12/2021   Procedure: CYSTOSCOPY WITH RETROGRADE PYELOGRAM;  Surgeon: Frank Frock, MD;  Location: [facility];  Service: Urology;  Laterality: Right;   CYSTOSCOPY/RETROGRADE/URETEROSCOPY Bilateral 09/25/2020   Procedure: CYSTOSCOPY/BILATERAL RETROGRADE/ RIGHT DIAGNOSTIC URETEROSCOPY/ LEFT URETERSOSCOPY WITH BIOPSY AND STENT;  Surgeon: Frank Frock, MD;  Location: [facility];  Service: Urology;  Laterality: Bilateral;   CYSTOSCOPY/URETEROSCOPY/HOLMIUM LASER/STENT PLACEMENT Left 11/22/2018   Procedure: CYSTOSCOPY LEFT URETEROSCOPY/HOLMIUM LASER/STENT EXCHANGE;  Surgeon: Frank Seal, MD;  Location: [facility];  Service: Urology;  Laterality: Left;   CYSTOSCOPY/URETEROSCOPY/HOLMIUM LASER/STENT PLACEMENT Left 12/25/2018   Procedure: CYSTOSCOPY LEFT RETROGRADE PYELOGRAM LEFT URETEROSCOPY WITH HOLMIUM LASER LEFT STENT EXCHANGE;  Surgeon: Frank Seal, MD;  Location: [facility];  Service: Urology;  Laterality: Left;   KNEE ARTHROPLASTY Right    KNEE ARTHROSCOPY Bilateral 1985   left knee arthroscopy      LITHOTRIPSY     right kne e replacement      ROBOT ASSITED LAPAROSCOPIC NEPHROURETERECTOMY Left 11/13/2020   Procedure: XI ROBOT ASSITED LAPAROSCOPIC NEPHROURETERECTOMY;  Surgeon: Frank Frock, MD;  Location:  [facility];  Service: Urology;  Laterality: Left;  3 HRS   TONSILLECTOMY     TRANSURETHRAL RESECTION OF BLADDER TUMOR N/A 09/25/2020   Procedure: RESTAGING TRANSURETHRAL RESECTION OF BLADDER TUMOR (TURBT);  Surgeon: Frank Frock, MD;  Location[facility];  Service: Urology;  Laterality: N/A;   TRANSURETHRAL RESECTION OF BLADDER TUMOR N/A 03/12/2021   Procedure: TRANSURETHRAL RESECTION OF BLADDER TUMOR (TURBT);  Surgeon: Frank Frock, MD;  Location: [facility];  Service: Urology;  Laterality: N/A;  1 HR   TRANSURETHRAL RESECTION OF BLADDER TUMOR WITH MITOMYCIN-C Bilateral 08/17/2020   Procedure: TRANSURETHRAL RESECTION OF BLADDER TUMOR , CYSTOSCOPY BILATERAL RETROGRADES;  Surgeon: Frank Seal, MD;  Location: [facility];  Service: Urology;  Laterality: Bilateral;   UPPER GI ENDOSCOPY      Current Medications: Current Meds  Medication Sig   acetaminophen (TYLENOL) 325 MG tablet Take 650 mg by mouth every 6 (six) hours as needed for mild pain, fever or headache.   amLODipine (NORVASC) 2.5 MG tablet Take 2.5 mg by mouth daily.   atorvastatin (LIPITOR) 20 MG tablet Take 20 mg by mouth daily.    buprenorphine (SUBUTEX) 2 MG SUBL SL tablet Place 2 mg under the tongue every 12 (twelve) hours as needed (pain).   glipiZIDE (GLUCOTROL XL) 10 MG 24 hr tablet Take 10 mg by mouth in the morning and at bedtime.    mupirocin ointment (BACTROBAN) 2 % Apply 1 application topically daily as needed for irritation.   olmesartan (BENICAR) 40 MG tablet Take 40 mg by mouth daily.    omeprazole (PRILOSEC) 40 MG capsule Take 40 mg by mouth daily.   ondansetron (ZOFRAN) 8 MG tablet Take 8 mg by mouth every 8 (eight) hours as needed for nausea or vomiting.   OVER THE COUNTER MEDICATION Apply 1 application topically daily as needed (neck pain). Joint Flex   oxyCODONE-acetaminophen (PERCOCET) 5-325 MG tablet Take 1 tablet by mouth every 6 (six) hours as needed for severe pain or moderate pain (post-operatively).   polyethylene glycol  (MIRALAX / GLYCOLAX) 17 g packet Take 17 g by mouth daily.   ranolazine (RANEXA) 500 MG 12 hr tablet Take 1 tablet (500 mg total) by mouth 2 (two) times daily.   tetrahydrozoline 0.05 % ophthalmic solution Place 1 drop into both eyes daily as needed (dry eyes).   Vitamin D, Ergocalciferol, (DRISDOL) 1.25 MG (50000 UNIT) CAPS capsule Take 50,000 Units by mouth every Tuesday.   zolpidem (AMBIEN) 10 MG tablet Take 10 mg by mouth at bedtime.     Allergies:   Patient has no known allergies.   Social History   Socioeconomic History   Marital status: Married    Spouse name: Not on file   Number of children: Not on file   Years of education: Not on file   Highest education level: Not on file  Occupational History   Occupation: retired Interior and spatial designer  Tobacco Use   Smoking status: Former    Types: Cigarettes    Quit date: 11/07/1994    Years since quitting: 26.7   Smokeless tobacco: Never  Vaping Use  Vaping Use: Never used  Substance and Sexual Activity   Alcohol use: Not Currently   Drug use: Never   Sexual activity: Yes  Other Topics Concern   Not on file  Social History Narrative   Not on file   Social Determinants of Health   Financial Resource Strain: Not on file  Food Insecurity: Not on file  Transportation Needs: Not on file  Physical Activity: Not on file  Stress: Not on file  Social Connections: Not on file     Family History: [name] family history includes Arthritis in his mother and sister; Diabetes in his maternal grandfather; Heart attack in his paternal grandfather; Hypertension in his maternal grandfather; Lymphoma in his brother; Pancreatic cancer in his brother. There is no history of Colon cancer, Colon polyps, Esophageal cancer, Rectal cancer, or Stomach cancer. ROS:   Please see the history of present illness.    All 14 point review of systems negative except as described per history of present illness  EKGs/Labs/Other Studies Reviewed:       Recent Labs: 03/03/2021: Hemoglobin 12.4; Platelets 175 03/12/2021: BUN 34; Creatinine, Ser 1.81; Potassium 4.7; Sodium 135  Recent Lipid Panel No results found for: "CHOL", "TRIG", "HDL", "CHOLHDL", "VLDL", "LDLCALC", "LDLDIRECT"  Physical Exam:    VS:  BP 110/80 (BP Location: Left Arm, Patient Position: Sitting, Cuff Size: Normal)   Pulse 85   Ht '5\' 6"'$  (1.676 m)   Wt 207 lb (93.9 kg)   SpO2 97%   BMI 33.41 kg/m     Wt Readings from Last 3 Encounters:  08/18/21 207 lb (93.9 kg)  03/12/21 201 lb (91.2 kg)  03/11/21 201 lb (91.2 kg)     GEN:  Well nourished, well developed in no acute distress HEENT: Normal NECK: No JVD; No carotid bruits LYMPHATICS: No lymphadenopathy CARDIAC: RRR, no murmurs, no rubs, no gallops RESPIRATORY:  Clear to auscultation without rales, wheezing or rhonchi  ABDOMEN: Soft, non-tender, non-distended MUSCULOSKELETAL:  No edema; No deformity  SKIN: Warm and dry LOWER EXTREMITIES: no swelling NEUROLOGIC:  Alert and oriented x 3 PSYCHIATRIC:  Normal affect   ASSESSMENT:    1. Coronary artery disease of native artery of native heart with stable angina pectoris ([place])   2. Essential hypertension   3. Type 2 diabetes mellitus with complication, without long-term current use of insulin ([place])   4. Dyslipidemia   5. Malignant neoplasm of urinary bladder, unspecified site [facility])    PLAN:    In order of problems listed above:  Coronary disease minimally abnormal stress test we had a long debate many times about potentially what to do with the situation.  He favor medical therapy possibly to switch his ranolazine to Imdur because of cost constraints.  Will do that I will put him on Imdur 30. Essential hypertension: Blood pressure seems well-controlled continue present management. Type 2 diabetes that being followed by antimedicine team.  I do have hemoglobin A1c which was 5.9 in March of this year. Dyslipidemia.  He is on moderate dose statin.  His  total cholesterol 117 HDL 40 this is from 03/15/2021.  Continue present management. Bladder cancer: Looks good.   Medication Adjustments/Labs and Tests Ordered: Current medicines are reviewed at length with the patient today.  Concerns regarding medicines are outlined above.  No orders of the defined types were placed in this encounter.  Medication changes: No orders of the defined types were placed in this encounter.   Signed, Park Liter, MD, [facility]  08/18/2021 8:43 AM    [facility]

## 2021-08-18 NOTE — Addendum Note (Signed)
Addended by: Jerl Santos R on: 08/18/2021 09:01 AM   Modules accepted: Orders

## 2021-08-18 NOTE — Patient Instructions (Signed)
Medication Instructions:  Your physician has recommended you make the following change in your medication:  Finish Ranolazine  Start Imdur 30 mg once daily once finished  *If you need a refill on your cardiac medications before your next appointment, please call your pharmacy*   Lab Work: NONE If you have labs (blood work) drawn today and your tests are completely normal, you will receive your results only by: [place] (if you have MyChart) OR A paper copy in the mail If you have any lab test that is abnormal or we need to change your treatment, we will call you to review the results.   Testing/Procedures: NONE   Follow-Up: At [facility], you and your health needs are our priority.  As part of our continuing mission to provide you with exceptional heart care, we have created designated Provider Care Teams.  These Care Teams include your primary Cardiologist (physician) and Advanced Practice Providers (APPs -  Physician Assistants and Nurse Practitioners) who all work together to provide you with the care you need, when you need it.  We recommend signing up for the patient portal called "MyChart".  Sign up information is provided on this After Visit Summary.  MyChart is used to connect with patients for Virtual Visits (Telemedicine).  Patients are able to view lab/test results, encounter notes, upcoming appointments, etc.  Non-urgent messages can be sent to your provider as well.   To learn more about what you can do with MyChart, go to [email or web address].    Your next appointment:   6 month(s)  The format for your next appointment:   In Person  Provider:   Jenne Campus, MD    Other Instructions   Important Information About Sugar

## 2021-08-19 ENCOUNTER — Other Ambulatory Visit: Payer: Self-pay | Admitting: *Deleted

## 2021-08-19 NOTE — Patient Outreach (Signed)
  Care Coordination   08/19/2021 Name: SCHYLER COUNSELL MRN: [id number] DOB: 27-Jul-1950   Care Coordination Outreach Attempts:  An unsuccessful telephone outreach was attempted today to offer the patient information about available care coordination services as a benefit of their health plan.   Follow Up Plan:  Additional outreach attempts will be made to offer the patient care coordination information and services.   Encounter Outcome:  No Answer  Care Coordination Interventions Activated:  Yes   Care Coordination Interventions:  No, not indicated    [facility] [phone]

## 2021-08-20 ENCOUNTER — Other Ambulatory Visit: Payer: Self-pay

## 2021-08-20 ENCOUNTER — Telehealth: Payer: Self-pay | Admitting: Cardiology

## 2021-08-20 MED ORDER — NITROGLYCERIN 0.4 MG SL SUBL: 0.4000 mg | SUBLINGUAL_TABLET | SUBLINGUAL | 11 refills | Status: DC | PRN

## 2021-08-20 NOTE — Telephone Encounter (Signed)
*  STAT* If patient is at the pharmacy, call can be transferred to refill team.   1. Which medications need to be refilled? (please list name of each medication and dose if known) nitroGLYCERIN (NITROSTAT) 0.4 MG SL tablet  2. Which pharmacy/location (including street and city if local pharmacy) is medication to be sent to?[id number] [place], [place] - [address]  3. Do they need a 30 day or 90 day supply? [address]

## 2021-09-15 DIAGNOSIS — E785 Hyperlipidemia, unspecified: Secondary | ICD-10-CM | POA: Diagnosis not present

## 2021-09-15 DIAGNOSIS — D500 Iron deficiency anemia secondary to blood loss (chronic): Secondary | ICD-10-CM | POA: Diagnosis not present

## 2021-09-15 DIAGNOSIS — E559 Vitamin D deficiency, unspecified: Secondary | ICD-10-CM | POA: Diagnosis not present

## 2021-09-15 DIAGNOSIS — N1832 Chronic kidney disease, stage 3b: Secondary | ICD-10-CM | POA: Diagnosis not present

## 2021-09-15 DIAGNOSIS — E1169 Type 2 diabetes mellitus with other specified complication: Secondary | ICD-10-CM | POA: Diagnosis not present

## 2021-09-15 DIAGNOSIS — E1121 Type 2 diabetes mellitus with diabetic nephropathy: Secondary | ICD-10-CM | POA: Diagnosis not present

## 2021-09-20 DIAGNOSIS — I5032 Chronic diastolic (congestive) heart failure: Secondary | ICD-10-CM | POA: Diagnosis not present

## 2021-09-20 DIAGNOSIS — E1121 Type 2 diabetes mellitus with diabetic nephropathy: Secondary | ICD-10-CM | POA: Diagnosis not present

## 2021-09-20 DIAGNOSIS — C652 Malignant neoplasm of left renal pelvis: Secondary | ICD-10-CM | POA: Diagnosis not present

## 2021-09-20 DIAGNOSIS — N138 Other obstructive and reflux uropathy: Secondary | ICD-10-CM | POA: Diagnosis not present

## 2021-09-20 DIAGNOSIS — I25119 Atherosclerotic heart disease of native coronary artery with unspecified angina pectoris: Secondary | ICD-10-CM | POA: Diagnosis not present

## 2021-09-20 DIAGNOSIS — G4700 Insomnia, unspecified: Secondary | ICD-10-CM | POA: Diagnosis not present

## 2021-09-20 DIAGNOSIS — N1832 Chronic kidney disease, stage 3b: Secondary | ICD-10-CM | POA: Diagnosis not present

## 2021-09-20 DIAGNOSIS — D500 Iron deficiency anemia secondary to blood loss (chronic): Secondary | ICD-10-CM | POA: Diagnosis not present

## 2021-09-20 DIAGNOSIS — I201 Angina pectoris with documented spasm: Secondary | ICD-10-CM | POA: Diagnosis not present

## 2021-09-20 DIAGNOSIS — G4733 Obstructive sleep apnea (adult) (pediatric): Secondary | ICD-10-CM | POA: Diagnosis not present

## 2021-09-20 DIAGNOSIS — C678 Malignant neoplasm of overlapping sites of bladder: Secondary | ICD-10-CM | POA: Diagnosis not present

## 2021-09-21 DIAGNOSIS — M1712 Unilateral primary osteoarthritis, left knee: Secondary | ICD-10-CM | POA: Diagnosis not present

## 2021-09-23 DIAGNOSIS — M961 Postlaminectomy syndrome, not elsewhere classified: Secondary | ICD-10-CM | POA: Diagnosis not present

## 2021-09-23 DIAGNOSIS — G894 Chronic pain syndrome: Secondary | ICD-10-CM | POA: Diagnosis not present

## 2021-09-23 DIAGNOSIS — S129XXS Fracture of neck, unspecified, sequela: Secondary | ICD-10-CM | POA: Diagnosis not present

## 2021-10-11 DIAGNOSIS — C678 Malignant neoplasm of overlapping sites of bladder: Secondary | ICD-10-CM | POA: Diagnosis not present

## 2021-10-12 DIAGNOSIS — B351 Tinea unguium: Secondary | ICD-10-CM | POA: Diagnosis not present

## 2021-10-12 DIAGNOSIS — Z7984 Long term (current) use of oral hypoglycemic drugs: Secondary | ICD-10-CM | POA: Diagnosis not present

## 2021-10-12 DIAGNOSIS — E119 Type 2 diabetes mellitus without complications: Secondary | ICD-10-CM | POA: Diagnosis not present

## 2021-10-25 ENCOUNTER — Other Ambulatory Visit (HOSPITAL_COMMUNITY): Payer: Self-pay

## 2021-10-25 MED ORDER — HYDROCODONE-ACETAMINOPHEN 10-325 MG PO TABS
1.0000 | ORAL_TABLET | Freq: Four times a day (QID) | ORAL | 0 refills | Status: DC | PRN
  Filled 2021-10-25: qty 120, 30d supply, fill #0

## 2021-10-29 ENCOUNTER — Other Ambulatory Visit: Payer: Self-pay | Admitting: Cardiology

## 2021-11-23 ENCOUNTER — Other Ambulatory Visit (HOSPITAL_COMMUNITY): Payer: Self-pay

## 2021-11-23 MED ORDER — HYDROCODONE-ACETAMINOPHEN 10-325 MG PO TABS
1.0000 | ORAL_TABLET | Freq: Four times a day (QID) | ORAL | 0 refills | Status: DC | PRN
  Filled 2021-11-23: qty 120, 30d supply, fill #0

## 2021-11-24 ENCOUNTER — Other Ambulatory Visit: Payer: Self-pay | Admitting: Cardiology

## 2021-11-24 DIAGNOSIS — M1712 Unilateral primary osteoarthritis, left knee: Secondary | ICD-10-CM | POA: Diagnosis not present

## 2021-11-29 ENCOUNTER — Telehealth: Payer: Self-pay

## 2021-11-29 NOTE — Telephone Encounter (Signed)
   [facility] Pre-operative Risk Assessment    Request for surgical clearance:  What type of surgery is being performed? Left Total Knee Arthroplasty   When is this surgery scheduled? TBD   What type of clearance is required (medical clearance vs. Pharmacy clearance to hold med vs. Both)? Both  Are there any medications that need to be held prior to surgery and how long?Not specified    Practice name and name of physician performing surgery? Dr. Joya Salm at [facility]    What is your office phone number: [phone]    7.   What is your office fax number: [phone]  8.   Anesthesia type (None, local, MAC, general) ? General Anesthesia   Basil Dess Carmell [place]gin 11/29/2021, 2:32 PM  _________________________________________________________________   (provider comments below)

## 2021-11-29 NOTE — Telephone Encounter (Signed)
   Name: Frank Ware  DOB: 1950-03-19  MRN: [id number]  Primary Cardiologist: None   Preoperative team, please contact this patient and set up a phone call appointment for further preoperative risk assessment. Please obtain consent and complete medication review. Thank you for your help.  I confirm that guidance regarding antiplatelet and oral anticoagulation therapy has been completed and, if necessary, noted below.  (None requested)   Deberah Pelton, NP 11/29/2021, 3:58 PM [place]

## 2021-11-30 NOTE — Telephone Encounter (Signed)
Primary card is Dr. Agustin Cree. S/w the pt and he tells me that he is not in any rush for his surgery and that he explained this to the surgeon office. Pt states he is thinking of not doing the surgery until spring time. I explained to the pt that we could do a tele visit for pre op, however the clearance once given is only good for 2 months. Pt opts to wait. I stated when he is ready to have the surgeon office fax over a new clearance request. Pt thanked me for the help and the call today. I assured the pt that I will update the requesting office as well.

## 2021-12-13 ENCOUNTER — Telehealth: Payer: Self-pay

## 2021-12-13 NOTE — Patient Outreach (Signed)
  Care Coordination   12/13/2021 Name: Frank Ware MRN: [id number] DOB: 07-06-1950   Care Coordination Outreach Attempts:  A second unsuccessful outreach was attempted today to offer the patient with information about available care coordination services as a benefit of their health plan.     Follow Up Plan:  Additional outreach attempts will be made to offer the patient care coordination information and services.   Encounter Outcome:  No Answer   Care Coordination Interventions:  No, not indicated    Tomasa Rand, RN, BSN, [facility] [employer] [phone]

## 2021-12-14 ENCOUNTER — Telehealth: Payer: Self-pay

## 2021-12-14 NOTE — Patient Outreach (Signed)
  Care Coordination   12/14/2021 Name: Frank Ware MRN: [id number] DOB: 01-Nov-1950   Care Coordination Outreach Attempts:  A third unsuccessful outreach was attempted today to offer the patient with information about available care coordination services as a benefit of their health plan.   Follow Up Plan:  No further outreach attempts will be made at this time. We have been unable to contact the patient to offer or enroll patient in care coordination services  Encounter Outcome:  No Answer   Care Coordination Interventions:  No, not indicated    Tomasa Rand, RN, BSN, [facility] [place] Coordinator [phone]

## 2021-12-17 ENCOUNTER — Other Ambulatory Visit (HOSPITAL_COMMUNITY): Payer: Self-pay

## 2021-12-17 DIAGNOSIS — G894 Chronic pain syndrome: Secondary | ICD-10-CM | POA: Diagnosis not present

## 2021-12-17 DIAGNOSIS — S129XXS Fracture of neck, unspecified, sequela: Secondary | ICD-10-CM | POA: Diagnosis not present

## 2021-12-17 DIAGNOSIS — M961 Postlaminectomy syndrome, not elsewhere classified: Secondary | ICD-10-CM | POA: Diagnosis not present

## 2021-12-17 DIAGNOSIS — Z79899 Other long term (current) drug therapy: Secondary | ICD-10-CM | POA: Diagnosis not present

## 2021-12-17 DIAGNOSIS — Z5181 Encounter for therapeutic drug level monitoring: Secondary | ICD-10-CM | POA: Diagnosis not present

## 2021-12-17 MED ORDER — HYDROCODONE-ACETAMINOPHEN 10-325 MG PO TABS
ORAL_TABLET | ORAL | 0 refills | Status: AC
  Filled 2022-01-25: qty 120, 30d supply, fill #0

## 2021-12-17 MED ORDER — HYDROCODONE-ACETAMINOPHEN 10-325 MG PO TABS
1.0000 | ORAL_TABLET | Freq: Four times a day (QID) | ORAL | 0 refills | Status: DC | PRN
  Filled 2021-12-23 (×2): qty 120, 30d supply, fill #0

## 2021-12-17 MED ORDER — HYDROCODONE-ACETAMINOPHEN 10-325 MG PO TABS: ORAL_TABLET | ORAL | 0 refills | Status: DC

## 2021-12-23 ENCOUNTER — Other Ambulatory Visit: Payer: Self-pay

## 2021-12-23 ENCOUNTER — Other Ambulatory Visit (HOSPITAL_COMMUNITY): Payer: Self-pay

## 2021-12-24 ENCOUNTER — Other Ambulatory Visit: Payer: Self-pay

## 2021-12-27 DIAGNOSIS — E119 Type 2 diabetes mellitus without complications: Secondary | ICD-10-CM | POA: Diagnosis not present

## 2021-12-31 DIAGNOSIS — E1121 Type 2 diabetes mellitus with diabetic nephropathy: Secondary | ICD-10-CM | POA: Diagnosis not present

## 2021-12-31 DIAGNOSIS — J40 Bronchitis, not specified as acute or chronic: Secondary | ICD-10-CM | POA: Diagnosis not present

## 2021-12-31 DIAGNOSIS — E559 Vitamin D deficiency, unspecified: Secondary | ICD-10-CM | POA: Diagnosis not present

## 2021-12-31 DIAGNOSIS — D500 Iron deficiency anemia secondary to blood loss (chronic): Secondary | ICD-10-CM | POA: Diagnosis not present

## 2021-12-31 DIAGNOSIS — Z0000 Encounter for general adult medical examination without abnormal findings: Secondary | ICD-10-CM | POA: Diagnosis not present

## 2021-12-31 DIAGNOSIS — C652 Malignant neoplasm of left renal pelvis: Secondary | ICD-10-CM | POA: Diagnosis not present

## 2021-12-31 DIAGNOSIS — N1832 Chronic kidney disease, stage 3b: Secondary | ICD-10-CM | POA: Diagnosis not present

## 2021-12-31 DIAGNOSIS — I25119 Atherosclerotic heart disease of native coronary artery with unspecified angina pectoris: Secondary | ICD-10-CM | POA: Diagnosis not present

## 2021-12-31 DIAGNOSIS — N138 Other obstructive and reflux uropathy: Secondary | ICD-10-CM | POA: Diagnosis not present

## 2021-12-31 DIAGNOSIS — K2960 Other gastritis without bleeding: Secondary | ICD-10-CM | POA: Diagnosis not present

## 2021-12-31 DIAGNOSIS — C678 Malignant neoplasm of overlapping sites of bladder: Secondary | ICD-10-CM | POA: Diagnosis not present

## 2021-12-31 DIAGNOSIS — Z79899 Other long term (current) drug therapy: Secondary | ICD-10-CM | POA: Diagnosis not present

## 2021-12-31 LAB — LAB REPORT - SCANNED
A1c: 5.8
EGFR: 50

## 2022-01-09 DIAGNOSIS — I5032 Chronic diastolic (congestive) heart failure: Secondary | ICD-10-CM | POA: Diagnosis not present

## 2022-01-09 DIAGNOSIS — N1832 Chronic kidney disease, stage 3b: Secondary | ICD-10-CM | POA: Diagnosis not present

## 2022-01-11 ENCOUNTER — Other Ambulatory Visit (HOSPITAL_COMMUNITY): Payer: Self-pay | Admitting: Urology

## 2022-01-11 ENCOUNTER — Ambulatory Visit (HOSPITAL_COMMUNITY)
Admission: RE | Admit: 2022-01-11 | Discharge: 2022-01-11 | Disposition: A | Discharge status: Home / Self Care | Payer: Medicare Other | Source: Ambulatory Visit | Attending: Urology | Admitting: Urology

## 2022-01-11 DIAGNOSIS — C652 Malignant neoplasm of left renal pelvis: Secondary | ICD-10-CM | POA: Diagnosis not present

## 2022-01-11 DIAGNOSIS — C642 Malignant neoplasm of left kidney, except renal pelvis: Secondary | ICD-10-CM | POA: Diagnosis not present

## 2022-01-17 DIAGNOSIS — K769 Liver disease, unspecified: Secondary | ICD-10-CM | POA: Diagnosis not present

## 2022-01-17 DIAGNOSIS — C652 Malignant neoplasm of left renal pelvis: Secondary | ICD-10-CM | POA: Diagnosis not present

## 2022-01-17 DIAGNOSIS — N289 Disorder of kidney and ureter, unspecified: Secondary | ICD-10-CM | POA: Diagnosis not present

## 2022-01-24 DIAGNOSIS — C678 Malignant neoplasm of overlapping sites of bladder: Secondary | ICD-10-CM | POA: Diagnosis not present

## 2022-01-25 ENCOUNTER — Other Ambulatory Visit (HOSPITAL_COMMUNITY): Payer: Self-pay

## 2022-02-24 ENCOUNTER — Other Ambulatory Visit (HOSPITAL_COMMUNITY): Payer: Self-pay

## 2022-03-18 DIAGNOSIS — Z79899 Other long term (current) drug therapy: Secondary | ICD-10-CM | POA: Diagnosis not present

## 2022-03-18 DIAGNOSIS — M961 Postlaminectomy syndrome, not elsewhere classified: Secondary | ICD-10-CM | POA: Diagnosis not present

## 2022-03-18 DIAGNOSIS — G894 Chronic pain syndrome: Secondary | ICD-10-CM | POA: Diagnosis not present

## 2022-04-10 DIAGNOSIS — I110 Hypertensive heart disease with heart failure: Secondary | ICD-10-CM | POA: Diagnosis not present

## 2022-04-10 DIAGNOSIS — E782 Mixed hyperlipidemia: Secondary | ICD-10-CM | POA: Diagnosis not present

## 2022-04-10 DIAGNOSIS — E1121 Type 2 diabetes mellitus with diabetic nephropathy: Secondary | ICD-10-CM | POA: Diagnosis not present

## 2022-04-10 DIAGNOSIS — I5032 Chronic diastolic (congestive) heart failure: Secondary | ICD-10-CM | POA: Diagnosis not present

## 2022-04-14 DIAGNOSIS — B351 Tinea unguium: Secondary | ICD-10-CM | POA: Diagnosis not present

## 2022-04-14 DIAGNOSIS — E119 Type 2 diabetes mellitus without complications: Secondary | ICD-10-CM | POA: Diagnosis not present

## 2022-04-14 DIAGNOSIS — Z7984 Long term (current) use of oral hypoglycemic drugs: Secondary | ICD-10-CM | POA: Diagnosis not present

## 2022-04-26 DIAGNOSIS — C678 Malignant neoplasm of overlapping sites of bladder: Secondary | ICD-10-CM | POA: Diagnosis not present

## 2022-04-26 DIAGNOSIS — R3915 Urgency of urination: Secondary | ICD-10-CM | POA: Diagnosis not present

## 2022-05-04 ENCOUNTER — Other Ambulatory Visit: Payer: Self-pay

## 2022-05-04 DIAGNOSIS — M1990 Unspecified osteoarthritis, unspecified site: Secondary | ICD-10-CM | POA: Insufficient documentation

## 2022-05-04 DIAGNOSIS — C801 Malignant (primary) neoplasm, unspecified: Secondary | ICD-10-CM | POA: Insufficient documentation

## 2022-05-04 DIAGNOSIS — I209 Angina pectoris, unspecified: Secondary | ICD-10-CM | POA: Insufficient documentation

## 2022-05-04 DIAGNOSIS — N189 Chronic kidney disease, unspecified: Secondary | ICD-10-CM | POA: Insufficient documentation

## 2022-05-12 ENCOUNTER — Ambulatory Visit: Payer: Medicare Other | Admitting: Cardiology

## 2022-05-26 NOTE — Progress Notes (Signed)
Cardiology Office Note:    Date:  05/27/2022   ID:  Frank Ware, DOB 1950-05-26, MRN [id number]  PCP:  Street, Stephanie Coup, MD   [facility] Providers Cardiologist:  None     Referring MD: Street, Stephanie Coup, *   CC: follow up of CAD  History of Present Illness:    Frank Ware is a 72 y.o. male with a hx of hypertension, CAD, GERD, DM 2, BPPV, history of bladder cancer, CKD, dyslipidemia.  He had a stress test in November 2021 which showed a small defect of mild severity present in the basal inferior location, consistent with ischemia, however the patient preferred a conservative approach.  His was started on Ranexa with improvement of symptoms.  He was most recently evaluated by Dr. Bing Matter on 08/18/2021, at that time he was doing well overall he did endorse a few episodes of chest tightness, his Ranexa was changed to Imdur secondary to cost constraints.  Blood pressure was well-controlled, as well as his cholesterol.  He presents today for follow-up of his CAD.  He is doing well from a cardiac perspective.  He has some ongoing left knee pain, requiring him to use a cane, considering knee replacement in the future.  He has had 1 episode of chest pain in his left lower chest that occurred shortly after eating, described it as a cramping sensation.  He did take nitroglycerin x 2, and the symptoms resolved.  He had no other associated cardiac symptoms with this.  He feels like his Imdur has been doing well for him and preventing recurrence of chest pain. He denies palpitations, dyspnea, pnd, orthopnea, n, v, dizziness, syncope, edema, weight gain, or early satiety.   Past Medical History:  Diagnosis Date   Abnormal stress test 12/03/2019   Acute pain 12/27/2018   Anginal pain (HCC)    Arthritis    Arthropathy of cervical facet joint 12/14/2016   Bilateral occipital neuralgia 10/13/2016   Bladder cancer (HCC) 08/17/2020   Body mass index 34.0-34.9, adult 09/02/2013    BPPV (benign paroxysmal positional vertigo)    Cancer (HCC)    Cervical pseudoarthrosis, sequela 02/07/2017   Cervical spinal stenosis 10/13/2016   Chronic kidney disease    Chronic pain syndrome 10/13/2016   Coronary artery disease minimal abnormal stress test 08/07/2020   Duodenal ulcer    Non-bleeding on EGD, March 2021   Dyslipidemia 11/18/2019   Essential hypertension 11/18/2019   Gastroesophageal reflux disease without esophagitis    Hiatal hernia    High cholesterol    History of COVID-19 06/2019   History of kidney stones    Hypertension    Ingrown nail 12/22/2020   Kidney stone 11/07/2019   Medication management 10/13/2016   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 08/18/2020   Pain around toenail, left foot 12/22/2020   Postlaminectomy syndrome of cervical region 10/13/2016   Precordial chest pain 11/18/2019   Presence of right artificial knee joint 10/13/2016   Primary localized osteoarthrosis, lower leg 10/13/2016   Toenail fungus 12/22/2020   Trochanteric bursitis of right hip 10/13/2016   Type 2 diabetes mellitus with complication, without long-term current use of insulin (HCC) 11/18/2019   Type 2 diabetes mellitus with hyperglycemia, without long-term current use of insulin (HCC)    Unilateral primary osteoarthritis, right knee 10/13/2016   Urothelial carcinoma of kidney, left (HCC) 11/13/2020   Vitamin D deficiency     Past Surgical History:  Procedure Laterality Date  BLADDER TUMOR EXCISION     CERVICAL FUSION     C3-C4 1992 and C5-C6 2001.   CERVICAL FUSION     x 2vc   COLONOSCOPY     CYSTOSCOPY N/A 11/13/2020   Procedure: CYSTOSCOPY;  Surgeon: Sebastian Ache, MD;  Location: [facility];  Service: Urology;  Laterality: N/A;   CYSTOSCOPY W/ RETROGRADES Right 03/12/2021   Procedure: CYSTOSCOPY WITH RETROGRADE PYELOGRAM;  Surgeon: Sebastian Ache, MD;  Location: [facility];  Service: Urology;  Laterality: Right;   CYSTOSCOPY/RETROGRADE/URETEROSCOPY  Bilateral 09/25/2020   Procedure: CYSTOSCOPY/BILATERAL RETROGRADE/ RIGHT DIAGNOSTIC URETEROSCOPY/ LEFT URETERSOSCOPY WITH BIOPSY AND STENT;  Surgeon: Sebastian Ache, MD;  Location: [facility];  Service: Urology;  Laterality: Bilateral;   CYSTOSCOPY/URETEROSCOPY/HOLMIUM LASER/STENT PLACEMENT Left 11/22/2018   Procedure: CYSTOSCOPY LEFT URETEROSCOPY/HOLMIUM LASER/STENT EXCHANGE;  Surgeon: Bjorn Pippin, MD;  Location: [facility];  Service: Urology;  Laterality: Left;   CYSTOSCOPY/URETEROSCOPY/HOLMIUM LASER/STENT PLACEMENT Left 12/25/2018   Procedure: CYSTOSCOPY LEFT RETROGRADE PYELOGRAM LEFT URETEROSCOPY WITH HOLMIUM LASER LEFT STENT EXCHANGE;  Surgeon: Bjorn Pippin, MD;  Location: [facility];  Service: Urology;  Laterality: Left;   KNEE ARTHROPLASTY Right    KNEE ARTHROSCOPY Bilateral 1985   left knee arthroscopy      LITHOTRIPSY     right kne e replacement      ROBOT ASSITED LAPAROSCOPIC NEPHROURETERECTOMY Left 11/13/2020   Procedure: XI ROBOT ASSITED LAPAROSCOPIC NEPHROURETERECTOMY;  Surgeon: Sebastian Ache, MD;  Location: [facility];  Service: Urology;  Laterality: Left;  3 HRS   TONSILLECTOMY     TRANSURETHRAL RESECTION OF BLADDER TUMOR N/A 09/25/2020   Procedure: RESTAGING TRANSURETHRAL RESECTION OF BLADDER TUMOR (TURBT);  Surgeon: Sebastian Ache, MD;  Location: [facility];  Service: Urology;  Laterality: N/A;   TRANSURETHRAL RESECTION OF BLADDER TUMOR N/A 03/12/2021   Procedure: TRANSURETHRAL RESECTION OF BLADDER TUMOR (TURBT);  Surgeon: Sebastian Ache, MD;  Location: [facility];  Service: Urology;  Laterality: N/A;  1 HR   TRANSURETHRAL RESECTION OF BLADDER TUMOR WITH MITOMYCIN-C Bilateral 08/17/2020   Procedure: TRANSURETHRAL RESECTION OF BLADDER TUMOR , CYSTOSCOPY BILATERAL RETROGRADES;  Surgeon: Bjorn Pippin, MD;  Location: [facility];  Service: Urology;  Laterality: Bilateral;   UPPER GI ENDOSCOPY      Current Medications: Current Meds  Medication Sig   acetaminophen  (TYLENOL) 325 MG tablet Take 650 mg by mouth every 6 (six) hours as needed for mild pain, fever or headache.   amLODipine (NORVASC) 2.5 MG tablet Take 2.5 mg by mouth daily.   atorvastatin (LIPITOR) 20 MG tablet Take 20 mg by mouth daily.    buprenorphine (SUBUTEX) 2 MG SUBL SL tablet Place 2 mg under the tongue every 12 (twelve) hours as needed (pain).   glipiZIDE (GLUCOTROL XL) 10 MG 24 hr tablet Take 10 mg by mouth in the morning and at bedtime.    HYDROcodone-acetaminophen (NORCO) 10-325 MG tablet Take 1 tablet by mouth every 6 (six) hours as needed for up to 30 days for Pain.(01/22/22)   isosorbide mononitrate (IMDUR) 30 MG 24 hr tablet Take 1 tablet (30 mg total) by mouth daily.   mupirocin ointment (BACTROBAN) 2 % Apply 1 application topically daily as needed for irritation.   nitroGLYCERIN (NITROSTAT) 0.4 MG SL tablet Place 0.4 mg under the tongue every 5 (five) minutes as needed for chest pain.   olmesartan (BENICAR) 40 MG tablet Take 40 mg by mouth daily.    omeprazole (PRILOSEC) 40 MG capsule Take 40 mg by mouth daily.   ondansetron (ZOFRAN)  8 MG tablet Take 8 mg by mouth every 8 (eight) hours as needed for nausea or vomiting.   OVER THE COUNTER MEDICATION Apply 1 application topically daily as needed (neck pain). Joint Flex   polyethylene glycol (MIRALAX / GLYCOLAX) 17 g packet Take 17 g by mouth daily.   tamsulosin (FLOMAX) 0.4 MG CAPS capsule Take 0.4 mg by mouth daily.   tetrahydrozoline 0.05 % ophthalmic solution Place 1 drop into both eyes daily as needed (dry eyes).   Vitamin D, Ergocalciferol, (DRISDOL) 1.25 MG (50000 UNIT) CAPS capsule Take 50,000 Units by mouth every Tuesday.   zolpidem (AMBIEN) 10 MG tablet Take 10 mg by mouth at bedtime.     Allergies:   Patient has no known allergies.   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  Tobacco Use   Smoking status: Former    Types: Cigarettes    Quit date: 11/07/1994    Years since quitting: 27.5   Smokeless tobacco: Never  Vaping Use   Vaping Use: Never used  Substance and Sexual Activity   Alcohol use: Not Currently   Drug use: Never   Sexual activity: Yes  Other Topics Concern   Not on file  Social History Narrative   Not on file   Social Determinants of Health   Financial Resource Strain: Not on file  Food Insecurity: Not on file  Transportation Needs: Not on file  Physical Activity: Not on file  Stress: Not on file  Social Connections: Not on file     Family History: [name] family history includes Arthritis in his mother and sister; Diabetes in his maternal grandfather; Heart attack in his paternal grandfather; Hypertension in his maternal grandfather; Lymphoma in his brother; Pancreatic cancer in his brother. There is no history of Colon cancer, Colon polyps, Esophageal cancer, Rectal cancer, or Stomach cancer.  ROS:   Please see the history of present illness.    All other systems reviewed and are negative.  EKGs/Labs/Other Studies Reviewed:    The following studies were reviewed today: Cardiac Studies & Procedures     STRESS TESTS  MYOCARDIAL PERFUSION IMAGING 11/27/2019  Narrative  The left ventricular ejection fraction is normal (55-65%).  Nuclear stress EF: 64%.  There was no ST segment deviation noted during stress.  No T wave inversion was noted during stress.  Defect 1: There is a small defect of mild severity present in the basal inferior location.  Findings consistent with ischemia.  This is a low risk study.                EKG:  EKG is not ordered today.    Recent Labs: No results found for requested labs within last 365 days.  Recent Lipid Panel No results found for: "CHOL", "TRIG", "HDL", "CHOLHDL", "VLDL", "LDLCALC",  "LDLDIRECT"   Risk Assessment/Calculations:                Physical Exam:    VS:  BP 120/80 (BP Location: Right Arm, Patient Position: Sitting, Cuff Size: Normal)   Pulse 80   Ht 5\' 6"  (1.676 m)   Wt 193 lb 6.4 oz (87.7 kg)   SpO2 99%   BMI 31.22 kg/m  Wt Readings from Last 3 Encounters:  05/27/22 193 lb 6.4 oz (87.7 kg)  08/18/21 207 lb (93.9 kg)  03/12/21 201 lb (91.2 kg)     GEN:  Well nourished, well developed in no acute distress HEENT: Normal NECK: No JVD; No carotid bruits LYMPHATICS: No lymphadenopathy CARDIAC: RRR, no murmurs, rubs, gallops RESPIRATORY:  Clear to auscultation without rales, wheezing or rhonchi  ABDOMEN: Soft, non-tender, non-distended MUSCULOSKELETAL:  No edema; No deformity  SKIN: Warm and dry NEUROLOGIC:  Alert and oriented x 3 PSYCHIATRIC:  Normal affect   ASSESSMENT:    1. Essential hypertension   2. Coronary artery disease of native artery of native heart with stable angina pectoris (HCC)   3. Type 2 diabetes mellitus with complication, without long-term current use of insulin (HCC)   4. Dyslipidemia    PLAN:    In order of problems listed above:  CAD- stress test in November 2021 which showed a small defect of mild severity present in the basal inferior location, consistent with ischemia, however he is favored a conservative approach.  We did discuss possibility of coronary CTA in the future, he will consider this and let us know if he wants to proceed.  He has had 1 episode of angina over the last 6 months. Stable with no further anginal symptoms. No indication for ischemic evaluation.  Continue Lipitor 20 mg daily, continue aspirin 81 mg daily, Imdur 30 mg daily, nitroglycerin as needed-Nusz has needed on 1 occasion over the last 6 months.  Discussed increasing his Imdur however at this time he wants to continue with his current medication regimen.  He will contact us if incidence of chest pain increase. Heart healthy diet and  regular cardiovascular exercise encouraged.   Hypertension-blood pressure is well-controlled today at 120/80, continue Norvasc 2.5 mg daily, continue Benicar 40 mg daily.  Blood work was checked by his PCP in December and it revealed creatinine 1.5, which is an improvement for him. Dyslipidemia-most recent LDL in December 2023 was well-controlled at 48, continue Lipitor 20 mg daily.  Recent LFTs by his PCP were WNL. DM2-recent A1c was 5.8, managed by his PCP.  Disposition-return in 6 months.           Medication Adjustments/Labs and Tests Ordered: Current medicines are reviewed at length with the patient today.  Concerns regarding medicines are outlined above.  No orders of the defined types were placed in this encounter.  No orders of the defined types were placed in this encounter.   Patient Instructions  Medication Instructions:  Your physician recommends that you continue on your current medications as directed. Please refer to the Current Medication list given to you today.  *If you need a refill on your cardiac medications before your next appointment, please call your pharmacy*   Lab Work: NONE If you have labs (blood work) drawn today and your tests are completely normal, you will receive your results only by: MyChart Message (if you have MyChart) OR A paper copy in the mail If you have any lab test that is abnormal or we need to change your treatment, we will call you to review the results.   Testing/Procedures: NONE   Follow-Up: At [facility], you and your health needs are our priority.  As part of our continuing mission to provide you with exceptional heart care, we have created designated Provider Care Teams.  These Care Teams include your primary Cardiologist (physician) and Advanced Practice Providers (APPs -  Physician Assistants  and Nurse Practitioners) who all work together to provide you with the care you need, when you need it.  We recommend  signing up for the patient portal called "MyChart".  Sign up information is provided on this After Visit Summary.  MyChart is used to connect with patients for Virtual Visits (Telemedicine).  Patients are able to view lab/test results, encounter notes, upcoming appointments, etc.  Non-urgent messages can be sent to your provider as well.   To learn more about what you can do with MyChart, go to [email or web address].    Your next appointment:   6 month(s)  Provider:   Gypsy Balsam, MD    Other Instructions     Signed, Flossie Dibble, NP  05/27/2022 8:14 AM    [facility]

## 2022-05-27 ENCOUNTER — Encounter: Payer: Self-pay | Admitting: Cardiology

## 2022-05-27 ENCOUNTER — Ambulatory Visit: Payer: Medicare Other | Attending: Cardiology | Admitting: Cardiology

## 2022-05-27 VITALS — BP 120/80 | HR 80 | Ht 66.0 in | Wt 193.4 lb

## 2022-05-27 DIAGNOSIS — E785 Hyperlipidemia, unspecified: Secondary | ICD-10-CM

## 2022-05-27 DIAGNOSIS — Z7984 Long term (current) use of oral hypoglycemic drugs: Secondary | ICD-10-CM

## 2022-05-27 DIAGNOSIS — E118 Type 2 diabetes mellitus with unspecified complications: Secondary | ICD-10-CM

## 2022-05-27 DIAGNOSIS — I10 Essential (primary) hypertension: Secondary | ICD-10-CM

## 2022-05-27 DIAGNOSIS — I25118 Atherosclerotic heart disease of native coronary artery with other forms of angina pectoris: Secondary | ICD-10-CM | POA: Diagnosis not present

## 2022-05-27 NOTE — Patient Instructions (Signed)
Medication Instructions:  Your physician recommends that you continue on your current medications as directed. Please refer to the Current Medication list given to you today.  *If you need a refill on your cardiac medications before your next appointment, please call your pharmacy*   Lab Work: NONE If you have labs (blood work) drawn today and your tests are completely normal, you will receive your results only by: MyChart Message (if you have MyChart) OR A paper copy in the mail If you have any lab test that is abnormal or we need to change your treatment, we will call you to review the results.   Testing/Procedures: NONE   Follow-Up: At [facility], you and your health needs are our priority.  As part of our continuing mission to provide you with exceptional heart care, we have created designated Provider Care Teams.  These Care Teams include your primary Cardiologist (physician) and Advanced Practice Providers (APPs -  Physician Assistants and Nurse Practitioners) who all work together to provide you with the care you need, when you need it.  We recommend signing up for the patient portal called "MyChart".  Sign up information is provided on this After Visit Summary.  MyChart is used to connect with patients for Virtual Visits (Telemedicine).  Patients are able to view lab/test results, encounter notes, upcoming appointments, etc.  Non-urgent messages can be sent to your provider as well.   To learn more about what you can do with MyChart, go to [email or web address].    Your next appointment:   6 month(s)  Provider:   Robert Krasowski, MD    Other Instructions   

## 2022-06-11 DIAGNOSIS — M79602 Pain in left arm: Secondary | ICD-10-CM | POA: Diagnosis not present

## 2022-06-11 DIAGNOSIS — S41112A Laceration without foreign body of left upper arm, initial encounter: Secondary | ICD-10-CM | POA: Diagnosis not present

## 2022-06-15 DIAGNOSIS — E1121 Type 2 diabetes mellitus with diabetic nephropathy: Secondary | ICD-10-CM | POA: Diagnosis not present

## 2022-06-15 DIAGNOSIS — D510 Vitamin B12 deficiency anemia due to intrinsic factor deficiency: Secondary | ICD-10-CM | POA: Diagnosis not present

## 2022-06-15 DIAGNOSIS — E559 Vitamin D deficiency, unspecified: Secondary | ICD-10-CM | POA: Diagnosis not present

## 2022-06-17 DIAGNOSIS — M47812 Spondylosis without myelopathy or radiculopathy, cervical region: Secondary | ICD-10-CM | POA: Diagnosis not present

## 2022-06-17 DIAGNOSIS — M961 Postlaminectomy syndrome, not elsewhere classified: Secondary | ICD-10-CM | POA: Diagnosis not present

## 2022-06-17 DIAGNOSIS — G894 Chronic pain syndrome: Secondary | ICD-10-CM | POA: Diagnosis not present

## 2022-06-20 DIAGNOSIS — N1832 Chronic kidney disease, stage 3b: Secondary | ICD-10-CM | POA: Diagnosis not present

## 2022-06-20 DIAGNOSIS — C652 Malignant neoplasm of left renal pelvis: Secondary | ICD-10-CM | POA: Diagnosis not present

## 2022-06-20 DIAGNOSIS — C678 Malignant neoplasm of overlapping sites of bladder: Secondary | ICD-10-CM | POA: Diagnosis not present

## 2022-06-20 DIAGNOSIS — Z905 Acquired absence of kidney: Secondary | ICD-10-CM | POA: Diagnosis not present

## 2022-06-20 DIAGNOSIS — I5032 Chronic diastolic (congestive) heart failure: Secondary | ICD-10-CM | POA: Diagnosis not present

## 2022-06-20 DIAGNOSIS — I201 Angina pectoris with documented spasm: Secondary | ICD-10-CM | POA: Diagnosis not present

## 2022-06-20 DIAGNOSIS — E1121 Type 2 diabetes mellitus with diabetic nephropathy: Secondary | ICD-10-CM | POA: Diagnosis not present

## 2022-06-20 DIAGNOSIS — I25119 Atherosclerotic heart disease of native coronary artery with unspecified angina pectoris: Secondary | ICD-10-CM | POA: Diagnosis not present

## 2022-06-20 DIAGNOSIS — I110 Hypertensive heart disease with heart failure: Secondary | ICD-10-CM | POA: Diagnosis not present

## 2022-06-20 DIAGNOSIS — D500 Iron deficiency anemia secondary to blood loss (chronic): Secondary | ICD-10-CM | POA: Diagnosis not present

## 2022-06-20 DIAGNOSIS — G4700 Insomnia, unspecified: Secondary | ICD-10-CM | POA: Diagnosis not present

## 2022-07-18 DIAGNOSIS — R3915 Urgency of urination: Secondary | ICD-10-CM | POA: Diagnosis not present

## 2022-07-18 DIAGNOSIS — C678 Malignant neoplasm of overlapping sites of bladder: Secondary | ICD-10-CM | POA: Diagnosis not present

## 2022-08-21 ENCOUNTER — Other Ambulatory Visit: Payer: Self-pay | Admitting: Cardiology

## 2022-09-20 DIAGNOSIS — E785 Hyperlipidemia, unspecified: Secondary | ICD-10-CM | POA: Diagnosis not present

## 2022-09-20 DIAGNOSIS — E1169 Type 2 diabetes mellitus with other specified complication: Secondary | ICD-10-CM | POA: Diagnosis not present

## 2022-09-20 DIAGNOSIS — Z79899 Other long term (current) drug therapy: Secondary | ICD-10-CM | POA: Diagnosis not present

## 2022-09-20 DIAGNOSIS — G894 Chronic pain syndrome: Secondary | ICD-10-CM | POA: Diagnosis not present

## 2022-09-20 DIAGNOSIS — M961 Postlaminectomy syndrome, not elsewhere classified: Secondary | ICD-10-CM | POA: Diagnosis not present

## 2022-09-20 DIAGNOSIS — S129XXS Fracture of neck, unspecified, sequela: Secondary | ICD-10-CM | POA: Diagnosis not present

## 2022-09-20 DIAGNOSIS — N1832 Chronic kidney disease, stage 3b: Secondary | ICD-10-CM | POA: Diagnosis not present

## 2022-09-20 DIAGNOSIS — D500 Iron deficiency anemia secondary to blood loss (chronic): Secondary | ICD-10-CM | POA: Diagnosis not present

## 2022-09-21 DIAGNOSIS — E1169 Type 2 diabetes mellitus with other specified complication: Secondary | ICD-10-CM | POA: Diagnosis not present

## 2022-09-22 DIAGNOSIS — C678 Malignant neoplasm of overlapping sites of bladder: Secondary | ICD-10-CM | POA: Diagnosis not present

## 2022-09-22 DIAGNOSIS — E1121 Type 2 diabetes mellitus with diabetic nephropathy: Secondary | ICD-10-CM | POA: Diagnosis not present

## 2022-09-22 DIAGNOSIS — N1832 Chronic kidney disease, stage 3b: Secondary | ICD-10-CM | POA: Diagnosis not present

## 2022-09-22 DIAGNOSIS — I5032 Chronic diastolic (congestive) heart failure: Secondary | ICD-10-CM | POA: Diagnosis not present

## 2022-09-22 DIAGNOSIS — E782 Mixed hyperlipidemia: Secondary | ICD-10-CM | POA: Diagnosis not present

## 2022-09-22 DIAGNOSIS — I110 Hypertensive heart disease with heart failure: Secondary | ICD-10-CM | POA: Diagnosis not present

## 2022-09-22 DIAGNOSIS — N138 Other obstructive and reflux uropathy: Secondary | ICD-10-CM | POA: Diagnosis not present

## 2022-09-22 DIAGNOSIS — C652 Malignant neoplasm of left renal pelvis: Secondary | ICD-10-CM | POA: Diagnosis not present

## 2022-09-22 DIAGNOSIS — I25119 Atherosclerotic heart disease of native coronary artery with unspecified angina pectoris: Secondary | ICD-10-CM | POA: Diagnosis not present

## 2022-09-22 DIAGNOSIS — G4700 Insomnia, unspecified: Secondary | ICD-10-CM | POA: Diagnosis not present

## 2022-09-22 DIAGNOSIS — I201 Angina pectoris with documented spasm: Secondary | ICD-10-CM | POA: Diagnosis not present

## 2022-09-23 DIAGNOSIS — Z79899 Other long term (current) drug therapy: Secondary | ICD-10-CM | POA: Diagnosis not present

## 2022-10-04 DIAGNOSIS — M961 Postlaminectomy syndrome, not elsewhere classified: Secondary | ICD-10-CM | POA: Diagnosis not present

## 2022-10-04 DIAGNOSIS — G894 Chronic pain syndrome: Secondary | ICD-10-CM | POA: Diagnosis not present

## 2022-10-10 DIAGNOSIS — M9904 Segmental and somatic dysfunction of sacral region: Secondary | ICD-10-CM | POA: Diagnosis not present

## 2022-10-10 DIAGNOSIS — M9902 Segmental and somatic dysfunction of thoracic region: Secondary | ICD-10-CM | POA: Diagnosis not present

## 2022-10-10 DIAGNOSIS — M9903 Segmental and somatic dysfunction of lumbar region: Secondary | ICD-10-CM | POA: Diagnosis not present

## 2022-10-17 DIAGNOSIS — C678 Malignant neoplasm of overlapping sites of bladder: Secondary | ICD-10-CM | POA: Diagnosis not present

## 2022-10-17 DIAGNOSIS — R3915 Urgency of urination: Secondary | ICD-10-CM | POA: Diagnosis not present

## 2022-11-15 DIAGNOSIS — M1712 Unilateral primary osteoarthritis, left knee: Secondary | ICD-10-CM | POA: Diagnosis not present

## 2022-11-22 NOTE — Progress Notes (Unsigned)
Cardiology Office Note:    Date:  11/24/2022   ID:  Frank Ware, DOB Jun 01, 1950, MRN [id number]  PCP:  Street, Stephanie Coup, MD   [facility] Providers Cardiologist:  Gypsy Balsam, MD     Referring MD: [address] Stephanie Coup, *   CC: follow up of CAD  History of Present Illness:    Frank Ware is a 72 y.o. male with a hx of hypertension, CAD, GERD, DM 2, BPPV, history of bladder cancer, CKD, dyslipidemia, RBBB.  He had a stress test in November 2021 which showed a small defect of mild severity present in the basal inferior location, consistent with ischemia, however the patient preferred a conservative approach.  His was started on Ranexa with improvement of symptoms.  Evaluated on 05/27/2022 for follow-up of his CAD, he was doing well from a cardiac perspective, he had 1 episode of chest pain that he needed to take nitroglycerin for, otherwise was stable.  He presents today for follow-up of his CAD, he states that he has noticed increased frequency of " chest cramping", has taken his nitroglycerin more frequently than he previously had been--states it will last for a few minutes, sometimes he takes nitroglycerin and it relieves it, has no other anginal symptoms when this occurs..  His EKG today shows a new RBBB. He denies palpitations, dyspnea, pnd, orthopnea, n, v, dizziness, syncope, edema, weight gain, or early satiety.   Past Medical History:  Diagnosis Date   Abnormal stress test 12/03/2019   Acute pain 12/27/2018   Anginal pain (HCC)    Arthritis    Arthropathy of cervical facet joint 12/14/2016   Bilateral occipital neuralgia 10/13/2016   Bladder cancer (HCC) 08/17/2020   Body mass index 34.0-34.9, adult 09/02/2013   BPPV (benign paroxysmal positional vertigo)    Cancer (HCC)    Cervical pseudoarthrosis, sequela 02/07/2017   Cervical spinal stenosis 10/13/2016   Chronic kidney disease    Chronic pain syndrome 10/13/2016   Coronary artery disease  minimal abnormal stress test 08/07/2020   Duodenal ulcer    Non-bleeding on EGD, March 2021   Dyslipidemia 11/18/2019   Essential hypertension 11/18/2019   Gastroesophageal reflux disease without esophagitis    Hiatal hernia    High cholesterol    History of COVID-19 06/2019   History of kidney stones    Hypertension    Ingrown nail 12/22/2020   Kidney stone 11/07/2019   Medication management 10/13/2016   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 08/18/2020   Pain around toenail, left foot 12/22/2020   Postlaminectomy syndrome of cervical region 10/13/2016   Precordial chest pain 11/18/2019   Presence of right artificial knee joint 10/13/2016   Primary localized osteoarthrosis, lower leg 10/13/2016   Toenail fungus 12/22/2020   Trochanteric bursitis of right hip 10/13/2016   Type 2 diabetes mellitus with complication, without long-term current use of insulin (HCC) 11/18/2019   Type 2 diabetes mellitus with hyperglycemia, without long-term current use of insulin (HCC)    Unilateral primary osteoarthritis, right knee 10/13/2016   Urothelial carcinoma of kidney, left (HCC) 11/13/2020   Vitamin D deficiency     Past Surgical History:  Procedure Laterality Date   BLADDER TUMOR EXCISION     CERVICAL FUSION     C3-C4 1992 and C5-C6 2001.   CERVICAL FUSION     x 2vc   COLONOSCOPY     CYSTOSCOPY N/A 11/13/2020   Procedure: CYSTOSCOPY;  Surgeon: Sebastian Ache, MD;  Location:  [facility];  Service: Urology;  Laterality: N/A;   CYSTOSCOPY W/ RETROGRADES Right 03/12/2021   Procedure: CYSTOSCOPY WITH RETROGRADE PYELOGRAM;  Surgeon: Sebastian Ache, MD;  Location: [facility];  Service: Urology;  Laterality: Right;   CYSTOSCOPY/RETROGRADE/URETEROSCOPY Bilateral 09/25/2020   Procedure: CYSTOSCOPY/BILATERAL RETROGRADE/ RIGHT DIAGNOSTIC URETEROSCOPY/ LEFT URETERSOSCOPY WITH BIOPSY AND STENT;  Surgeon: Sebastian Ache, MD;  Location: [facility];  Service: Urology;  Laterality: Bilateral;    CYSTOSCOPY/URETEROSCOPY/HOLMIUM LASER/STENT PLACEMENT Left 11/22/2018   Procedure: CYSTOSCOPY LEFT URETEROSCOPY/HOLMIUM LASER/STENT EXCHANGE;  Surgeon: Bjorn Pippin, MD;  Location: [facility];  Service: Urology;  Laterality: Left;   CYSTOSCOPY/URETEROSCOPY/HOLMIUM LASER/STENT PLACEMENT Left 12/25/2018   Procedure: CYSTOSCOPY LEFT RETROGRADE PYELOGRAM LEFT URETEROSCOPY WITH HOLMIUM LASER LEFT STENT EXCHANGE;  Surgeon: Bjorn Pippin, MD;  Location: [facility]  Service: Urology;  Laterality: Left;   KNEE ARTHROPLASTY Right    KNEE ARTHROSCOPY Bilateral 1985   left knee arthroscopy      LITHOTRIPSY     right kne e replacement      ROBOT ASSITED LAPAROSCOPIC NEPHROURETERECTOMY Left 11/13/2020   Procedure: XI ROBOT ASSITED LAPAROSCOPIC NEPHROURETERECTOMY;  Surgeon: Sebastian Ache, MD;  Location: [facility];  Service: Urology;  Laterality: Left;  3 HRS   TONSILLECTOMY     TRANSURETHRAL RESECTION OF BLADDER TUMOR N/A 09/25/2020   Procedure: RESTAGING TRANSURETHRAL RESECTION OF BLADDER TUMOR (TURBT);  Surgeon: Sebastian Ache, MD;  Location: [facility];  Service: Urology;  Laterality: N/A;   TRANSURETHRAL RESECTION OF BLADDER TUMOR N/A 03/12/2021   Procedure: TRANSURETHRAL RESECTION OF BLADDER TUMOR (TURBT);  Surgeon: Sebastian Ache, MD;  Location: [facility];  Service: Urology;  Laterality: N/A;  1 HR   TRANSURETHRAL RESECTION OF BLADDER TUMOR WITH MITOMYCIN-C Bilateral 08/17/2020   Procedure: TRANSURETHRAL RESECTION OF BLADDER TUMOR , CYSTOSCOPY BILATERAL RETROGRADES;  Surgeon: Bjorn Pippin, MD;  Location: [facility];  Service: Urology;  Laterality: Bilateral;   UPPER GI ENDOSCOPY      Current Medications: Current Meds  Medication Sig   acetaminophen (TYLENOL) 325 MG tablet Take 650 mg by mouth every 6 (six) hours as needed for mild pain, fever or headache.   amLODipine (NORVASC) 2.5 MG tablet Take 2.5 mg by mouth daily.   atorvastatin (LIPITOR) 20 MG tablet Take 20 mg by mouth daily.     buprenorphine (SUBUTEX) 2 MG SUBL SL tablet Place 2 mg under the tongue every 12 (twelve) hours as needed (pain).   glipiZIDE (GLUCOTROL XL) 5 MG 24 hr tablet Take 5 mg by mouth daily with breakfast.   HYDROcodone-acetaminophen (NORCO) 10-325 MG tablet Take 1 tablet by mouth every 6 (six) hours as needed for up to 30 days for Pain.(01/22/22)   isosorbide mononitrate (IMDUR) 60 MG 24 hr tablet Take 1 tablet (60 mg total) by mouth daily.   metoprolol tartrate (LOPRESSOR) 50 MG tablet Take 1 tablet (50 mg total) by mouth as directed. Take 50 mg night before and 100 mg 2 hours before CT   mupirocin ointment (BACTROBAN) 2 % Apply 1 application topically daily as needed for irritation.   olmesartan (BENICAR) 40 MG tablet Take 40 mg by mouth daily.    omeprazole (PRILOSEC) 40 MG capsule Take 40 mg by mouth daily.   ondansetron (ZOFRAN) 8 MG tablet Take 8 mg by mouth every 8 (eight) hours as needed for nausea or vomiting.   OVER THE COUNTER MEDICATION Apply 1 application topically daily as needed (neck pain). Joint Flex   polyethylene glycol (MIRALAX / GLYCOLAX) 17 g packet Take  17 g by mouth daily.   tamsulosin (FLOMAX) 0.4 MG CAPS capsule Take 0.4 mg by mouth daily.   tetrahydrozoline 0.05 % ophthalmic solution Place 1 drop into both eyes daily as needed (dry eyes).   Vitamin D, Ergocalciferol, (DRISDOL) 1.25 MG (50000 UNIT) CAPS capsule Take 50,000 Units by mouth every Tuesday.   zolpidem (AMBIEN) 10 MG tablet Take 10 mg by mouth at bedtime.   [DISCONTINUED] isosorbide mononitrate (IMDUR) 30 MG 24 hr tablet TAKE 1 TABLET BY MOUTH EVERY DAY   [DISCONTINUED] nitroGLYCERIN (NITROSTAT) 0.4 MG SL tablet Place 0.4 mg under the tongue every 5 (five) minutes as needed for chest pain.     Allergies:   Patient has no known allergies.   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  Tobacco Use   Smoking status: Former    Current packs/day: 0.00    Types: Cigarettes    Quit date: 11/07/1994    Years since quitting: 28.0   Smokeless tobacco: Never  Vaping Use   Vaping status: Never Used  Substance and Sexual Activity   Alcohol use: Not Currently   Drug use: Never   Sexual activity: Yes  Other Topics Concern   Not on file  Social History Narrative   Not on file   Social Determinants of Health   Financial Resource Strain: Not on file  Food Insecurity: Not on file  Transportation Needs: Not on file  Physical Activity: Not on file  Stress: Not on file  Social Connections: Not on file     Family History: [name] family history includes Arthritis in his mother and sister; Diabetes in his maternal grandfather; Heart attack in his paternal grandfather; Hypertension in his maternal grandfather; Lymphoma in his brother; Pancreatic cancer in his brother. There is no history of Colon cancer, Colon polyps, Esophageal cancer, Rectal cancer, or Stomach cancer.  ROS:   Please see the history of present illness.    All other systems reviewed and are negative.  EKGs/Labs/Other Studies Reviewed:    The following studies were reviewed today: Cardiac Studies & Procedures     STRESS TESTS  MYOCARDIAL PERFUSION IMAGING 11/27/2019  Narrative  The left ventricular ejection fraction is normal (55-65%).  Nuclear stress EF: 64%.  There was no ST segment deviation noted during stress.  No T wave inversion was noted during stress.  Defect 1: There is a small defect of mild severity present in the basal inferior location.  Findings consistent with ischemia.  This is a low risk study.                EKG:  EKG is not ordered today.    Recent Labs: No results found for requested labs within last 365 days.  Recent Lipid Panel No results found for: "CHOL",  "TRIG", "HDL", "CHOLHDL", "VLDL", "LDLCALC", "LDLDIRECT"   Risk Assessment/Calculations:                Physical Exam:    VS:  BP 128/80 (BP Location: Right Arm, Patient Position: Sitting, Cuff Size: Normal)   Pulse 92   Ht 5\' 6"  (1.676 m)   Wt 197 lb (89.4 kg)   SpO2 98%   BMI 31.80 kg/m  Wt Readings from Last 3 Encounters:  11/24/22 197 lb (89.4 kg)  05/27/22 193 lb 6.4 oz (87.7 kg)  08/18/21 207 lb (93.9 kg)     GEN:  Well nourished, well developed in no acute distress HEENT: Normal NECK: No JVD; No carotid bruits LYMPHATICS: No lymphadenopathy CARDIAC: RRR, no murmurs, rubs, gallops RESPIRATORY:  Clear to auscultation without rales, wheezing or rhonchi  ABDOMEN: Soft, non-tender, non-distended MUSCULOSKELETAL:  No edema; No deformity  SKIN: Warm and dry NEUROLOGIC:  Alert and oriented x 3 PSYCHIATRIC:  Normal affect   ASSESSMENT:    1. Coronary artery disease involving native coronary artery of native heart with angina pectoris (HCC)   2. Essential hypertension   3. Type 2 diabetes mellitus with complication, without long-term current use of insulin (HCC)   4. Mixed hyperlipidemia   5. Chest pain of uncertain etiology   6. Precordial pain   7. Pre-procedure lab exam     PLAN:    In order of problems listed above:  CAD- stress test in November 2021 which showed a small defect of mild severity present in the basal inferior location, consistent with ischemia, however he had favored a conservative approach.  His EKG today reveals a new RBBB, he has had increased frequency he with episodes of chest pain.  We discussed proceeding with a coronary CTA and he is agreeable to this.  Will also increase his Imdur to 60 mg daily.  Continue Lipitor 20 mg daily, continue nitroglycerin as needed, continue Imdur but we will increase to 60 mg daily, continue aspirin 81 mg daily.  Will check LPA, BMET.  Hypertension-blood pressure is well-controlled today at 128/80,  continue Norvasc 2.5 mg daily, continue Benicar 40 mg daily.    Dyslipidemia-lipids were checked by his PCP in June of this year, will request they forward to our office, preferred LDL be less than 70, continue with Lipitor 20 mg daily.  DM2-recent A1c was 5.7, managed by his PCP.  Disposition-coronary CTA, increase Imdur to 60 mg daily, labs per above, follow-up in 6 weeks.           Medication Adjustments/Labs and Tests Ordered: Current medicines are reviewed at length with the patient today.  Concerns regarding medicines are outlined above.  Orders Placed This Encounter  Procedures   CT CORONARY MORPH W/CTA COR W/SCORE W/CA W/CM &/OR WO/CM   CBC with Differential/Platelet   Basic metabolic panel   Lipoprotein A (LPA)   EKG 12-Lead   Meds ordered this encounter  Medications   metoprolol tartrate (LOPRESSOR) 50 MG tablet    Sig: Take 1 tablet (50 mg total) by mouth as directed. Take 50 mg night before and 100 mg 2 hours before CT    Dispense:  3 tablet    Refill:  0   isosorbide mononitrate (IMDUR) 60 MG 24 hr tablet    Sig: Take 1 tablet (60 mg total) by mouth daily.    Dispense:  90 tablet    Refill:  3   nitroGLYCERIN (NITROSTAT) 0.4 MG SL tablet    Sig: Place 1 tablet (0.4 mg total) under the tongue every 5 (five) minutes as needed for chest pain.    Dispense:  25 tablet    Refill:  3    Patient Instructions  Medication Instructions:  Your physician has recommended you make the following change in your medication:  Increase Imdur to 60 mg once daily Nitroglycerin 0.4 mg sublingual (under your tongue) as needed for chest pain.  If experiencing chest pain, stop what you are doing and sit down. Take 1 nitroglycerin and wait 5 minutes. If chest pain continues, take another nitroglycerin and wait 5 minutes. If chest pain does not subside, take 1 more nitroglycerin and dial 911. You make take a total of 3 nitroglycerin in a 15 minute time frame.   *If you need a refill on  your cardiac medications before your next appointment, please call your pharmacy*   Lab Work: Your physician recommends that you return for lab work in: Today for CBC, BMP and Lipoprotein A  If you have labs (blood work) drawn today and your tests are completely normal, you will receive your results only by: MyChart Message (if you have MyChart) OR A paper copy in the mail If you have any lab test that is abnormal or we need to change your treatment, we will call you to review the results.   Testing/Procedures: Please arrive at the [facility] main entrance of [facility] at xx:xx AM (30-45 minutes prior to test start time)  [facility] [address] [place], [place] [address] [phone]  Proceed to the [facility] Radiology Department (First Floor).  Please follow these instructions carefully (unless otherwise directed):  Hold all erectile dysfunction medications at least 48 hours prior to test.  On the Night Before the Test: Drink plenty of water. Do not consume any caffeinated/decaffeinated beverages or chocolate 12 hours prior to your test. Do not take any antihistamines 12 hours prior to your test. On the Day of the Test: Drink plenty of water. Do not drink any water within one hour of the test. Do not eat any food 4 hours prior to the test. You may take your regular medications prior to the test. IF NOT ON A BETA BLOCKER - Take 50 mg of lopressor (metoprolol) the night before and 100 mg two hours before the test.  After the Test: Drink plenty of water. After receiving IV contrast, you may experience a mild flushed feeling. This is normal. On occasion, you may experience a mild rash up to 24 hours after the test. This is not dangerous. If this occurs, you can take Benadryl 25 mg and increase your fluid intake. If you experience trouble breathing, this can be serious. If it is severe call 911 IMMEDIATELY. If it is mild, please call our office. If  you take any of these medications: Glipizide/Metformin, Avandament, Glucavance, please do not take 48 hours after completing test.    Follow-Up: At [facility], you and your health needs are our priority.  As part of our continuing mission to provide you with exceptional heart care, we have created designated Provider Care Teams.  These Care Teams include your primary Cardiologist (physician) and Advanced Practice Providers (APPs -  Physician Assistants and Nurse Practitioners) who all work together to provide you with the care you need, when you need it.  We recommend signing up for the patient portal called "MyChart".  Sign up information is provided on this After Visit Summary.  MyChart is used to connect with patients for Virtual Visits (Telemedicine).  Patients are able to view lab/test results, encounter notes, upcoming appointments, etc.  Non-urgent messages can be sent to your provider as well.   To learn more about what you can do with MyChart, go to [email or web address].    Your next appointment:   6 week(s)  Provider:   Wallis Bamberg, NP Rosalita Levan)    Other Instructions  Signed, Flossie Dibble, NP  11/24/2022 12:38 PM    [facility]

## 2022-11-24 ENCOUNTER — Encounter: Payer: Self-pay | Admitting: Cardiology

## 2022-11-24 ENCOUNTER — Ambulatory Visit: Payer: Medicare Other | Attending: Cardiology | Admitting: Cardiology

## 2022-11-24 VITALS — BP 128/80 | HR 92 | Ht 66.0 in | Wt 197.0 lb

## 2022-11-24 DIAGNOSIS — Z01812 Encounter for preprocedural laboratory examination: Secondary | ICD-10-CM | POA: Diagnosis not present

## 2022-11-24 DIAGNOSIS — E118 Type 2 diabetes mellitus with unspecified complications: Secondary | ICD-10-CM

## 2022-11-24 DIAGNOSIS — E782 Mixed hyperlipidemia: Secondary | ICD-10-CM | POA: Diagnosis not present

## 2022-11-24 DIAGNOSIS — E1165 Type 2 diabetes mellitus with hyperglycemia: Secondary | ICD-10-CM

## 2022-11-24 DIAGNOSIS — R072 Precordial pain: Secondary | ICD-10-CM

## 2022-11-24 DIAGNOSIS — I25119 Atherosclerotic heart disease of native coronary artery with unspecified angina pectoris: Secondary | ICD-10-CM

## 2022-11-24 DIAGNOSIS — I10 Essential (primary) hypertension: Secondary | ICD-10-CM | POA: Diagnosis not present

## 2022-11-24 DIAGNOSIS — R079 Chest pain, unspecified: Secondary | ICD-10-CM | POA: Diagnosis not present

## 2022-11-24 DIAGNOSIS — I4510 Unspecified right bundle-branch block: Secondary | ICD-10-CM

## 2022-11-24 MED ORDER — NITROGLYCERIN 0.4 MG SL SUBL: 0.4000 mg | SUBLINGUAL_TABLET | SUBLINGUAL | 3 refills | Status: AC | PRN

## 2022-11-24 MED ORDER — METOPROLOL TARTRATE 50 MG PO TABS: 50.0000 mg | ORAL_TABLET | ORAL | 0 refills | Status: DC

## 2022-11-24 MED ORDER — ISOSORBIDE MONONITRATE ER 60 MG PO TB24: 60.0000 mg | ORAL_TABLET | Freq: Every day | ORAL | 3 refills | Status: DC

## 2022-11-24 NOTE — Patient Instructions (Signed)
Medication Instructions:  Your physician has recommended you make the following change in your medication:  Increase Imdur to 60 mg once daily Nitroglycerin 0.4 mg sublingual (under your tongue) as needed for chest pain. If experiencing chest pain, stop what you are doing and sit down. Take 1 nitroglycerin and wait 5 minutes. If chest pain continues, take another nitroglycerin and wait 5 minutes. If chest pain does not subside, take 1 more nitroglycerin and dial 911. You make take a total of 3 nitroglycerin in a 15 minute time frame.   *If you need a refill on your cardiac medications before your next appointment, please call your pharmacy*   Lab Work: Your physician recommends that you return for lab work in: Today for CBC, BMP and Lipoprotein A  If you have labs (blood work) drawn today and your tests are completely normal, you will receive your results only by: MyChart Message (if you have MyChart) OR A paper copy in the mail If you have any lab test that is abnormal or we need to change your treatment, we will call you to review the results.   Testing/Procedures: Please arrive at the [facility] main entrance of [facility] at xx:xx AM (30-45 minutes prior to test start time)  [facility] [address] [place], [place] [address] [phone]  Proceed to the [facility] Radiology Department (First Floor).  Please follow these instructions carefully (unless otherwise directed):  Hold all erectile dysfunction medications at least 48 hours prior to test.  On the Night Before the Test: Drink plenty of water. Do not consume any caffeinated/decaffeinated beverages or chocolate 12 hours prior to your test. Do not take any antihistamines 12 hours prior to your test. On the Day of the Test: Drink plenty of water. Do not drink any water within one hour of the test. Do not eat any food 4 hours prior to the test. You may take your regular medications prior to the  test. IF NOT ON A BETA BLOCKER - Take 50 mg of lopressor (metoprolol) the night before and 100 mg two hours before the test.  After the Test: Drink plenty of water. After receiving IV contrast, you may experience a mild flushed feeling. This is normal. On occasion, you may experience a mild rash up to 24 hours after the test. This is not dangerous. If this occurs, you can take Benadryl 25 mg and increase your fluid intake. If you experience trouble breathing, this can be serious. If it is severe call 911 IMMEDIATELY. If it is mild, please call our office. If you take any of these medications: Glipizide/Metformin, Avandament, Glucavance, please do not take 48 hours after completing test.    Follow-Up: At [facility], you and your health needs are our priority.  As part of our continuing mission to provide you with exceptional heart care, we have created designated Provider Care Teams.  These Care Teams include your primary Cardiologist (physician) and Advanced Practice Providers (APPs -  Physician Assistants and Nurse Practitioners) who all work together to provide you with the care you need, when you need it.  We recommend signing up for the patient portal called "MyChart".  Sign up information is provided on this After Visit Summary.  MyChart is used to connect with patients for Virtual Visits (Telemedicine).  Patients are able to view lab/test results, encounter notes, upcoming appointments, etc.  Non-urgent messages can be sent to your provider as well.   To learn more about  what you can do with MyChart, go to [email or web address].    Your next appointment:   6 week(s)  Provider:   Wallis Bamberg, NP Rosalita Levan)    Other Instructions

## 2022-11-25 ENCOUNTER — Other Ambulatory Visit: Payer: Self-pay

## 2022-11-25 ENCOUNTER — Telehealth: Payer: Self-pay | Admitting: Cardiology

## 2022-11-25 ENCOUNTER — Telehealth: Payer: Self-pay

## 2022-11-25 DIAGNOSIS — E875 Hyperkalemia: Secondary | ICD-10-CM

## 2022-11-25 DIAGNOSIS — Z79899 Other long term (current) drug therapy: Secondary | ICD-10-CM

## 2022-11-25 LAB — CBC WITH DIFFERENTIAL/PLATELET
Basophils Absolute: 0.1 x10E3/uL (ref 0.0–0.2)
Basos: 1 %
EOS (ABSOLUTE): 0.1 x10E3/uL (ref 0.0–0.4)
Eos: 3 %
Hematocrit: 39.5 % (ref 37.5–51.0)
Hemoglobin: 12.8 g/dL — ABNORMAL LOW (ref 13.0–17.7)
Immature Grans (Abs): 0 x10E3/uL (ref 0.0–0.1)
Immature Granulocytes: 0 %
Lymphocytes Absolute: 1.1 x10E3/uL (ref 0.7–3.1)
Lymphs: 30 %
MCH: 32.5 pg (ref 26.6–33.0)
MCHC: 32.4 g/dL (ref 31.5–35.7)
MCV: 100 fL — ABNORMAL HIGH (ref 79–97)
Monocytes Absolute: 0.3 x10E3/uL (ref 0.1–0.9)
Monocytes: 9 %
Neutrophils Absolute: 2 x10E3/uL (ref 1.4–7.0)
Neutrophils: 57 %
Platelets: 172 x10E3/uL (ref 150–450)
RBC: 3.94 x10E6/uL — ABNORMAL LOW (ref 4.14–5.80)
RDW: 13.7 % (ref 11.6–15.4)
WBC: 3.6 x10E3/uL (ref 3.4–10.8)

## 2022-11-25 LAB — BASIC METABOLIC PANEL WITH GFR
BUN/Creatinine Ratio: 19 (ref 10–24)
BUN: 27 mg/dL (ref 8–27)
CO2: 17 mmol/L — ABNORMAL LOW (ref 20–29)
Calcium: 9.9 mg/dL (ref 8.6–10.2)
Chloride: 106 mmol/L (ref 96–106)
Creatinine, Ser: 1.43 mg/dL — ABNORMAL HIGH (ref 0.76–1.27)
Glucose: 137 mg/dL — ABNORMAL HIGH (ref 70–99)
Potassium: 5.8 mmol/L (ref 3.5–5.2)
Sodium: 138 mmol/L (ref 134–144)
eGFR: 53 mL/min/1.73 — ABNORMAL LOW

## 2022-11-25 MED ORDER — ASPIRIN 81 MG PO TBEC: 81.0000 mg | DELAYED_RELEASE_TABLET | Freq: Every day | ORAL | Status: AC

## 2022-11-25 NOTE — Telephone Encounter (Signed)
Calling with critical labs. Please advise

## 2022-11-25 NOTE — Telephone Encounter (Signed)
Received a STAT call from [facility] with a critical Potassium of 5.8. Informed Frank Bamberg, NP regarding the patient's critical potassium and there were no new orders for this patient at this time.

## 2022-11-25 NOTE — Telephone Encounter (Signed)
Labs addressed by J. Woody NP and R. [employer] Charity fundraiser

## 2022-11-26 LAB — LIPOPROTEIN A (LPA): Lipoprotein (a): 11.5 nmol/L

## 2022-12-07 ENCOUNTER — Encounter (HOSPITAL_COMMUNITY): Payer: Self-pay

## 2022-12-09 ENCOUNTER — Telehealth (HOSPITAL_COMMUNITY): Payer: Self-pay | Admitting: Emergency Medicine

## 2022-12-09 NOTE — Telephone Encounter (Signed)
Reaching out to patient to offer assistance regarding upcoming cardiac imaging study; pt verbalizes understanding of appt date/time, parking situation and where to check in, pre-test NPO status and medications ordered, and verified current allergies; name and call back number provided for further questions should they arise Cayne Yom RN Navigator Cardiac Imaging [facility] [phone] office [phone] cell 

## 2022-12-12 ENCOUNTER — Ambulatory Visit (HOSPITAL_COMMUNITY)
Admission: RE | Admit: 2022-12-12 | Discharge: 2022-12-12 | Disposition: A | Discharge status: Home / Self Care | Payer: Medicare Other | Source: Ambulatory Visit | Attending: Cardiology | Admitting: Cardiology

## 2022-12-12 DIAGNOSIS — R072 Precordial pain: Secondary | ICD-10-CM

## 2022-12-12 MED ORDER — IOHEXOL 350 MG/ML SOLN
95.0000 mL | Freq: Once | INTRAVENOUS | Status: AC | PRN
  Administered 2022-12-12: 95 mL via INTRAVENOUS

## 2022-12-12 MED ORDER — SODIUM CHLORIDE 0.9 % IV BOLUS
250.0000 mL | Freq: Once | INTRAVENOUS | Status: AC
  Administered 2022-12-12: 250 mL via INTRAVENOUS

## 2022-12-12 MED ORDER — NITROGLYCERIN 0.4 MG SL SUBL
SUBLINGUAL_TABLET | SUBLINGUAL | Status: AC
  Filled 2022-12-12: qty 1

## 2022-12-12 MED ORDER — NITROGLYCERIN 0.4 MG SL SUBL: 0.8000 mg | SUBLINGUAL_TABLET | Freq: Once | SUBLINGUAL | Status: DC

## 2022-12-12 NOTE — Progress Notes (Signed)
Patient ID: Frank Ware, male   DOB: [birth year], 72 y.o.   MRN: [id number] pt bp 86/56  no complaints of dizziness or lightheadness. Pt states "I feel just fine" Dr. Jens Som called" ok to give Iv fluids 250cc normal saline x 2

## 2022-12-12 NOTE — Progress Notes (Signed)
Patient ID: Frank Ware, male   DOB: 12-14-1950, 71 y.o.   MRN: [id number]  BP still low see flowsheets. Dr. Jens Som aware continue with CT heart with no Nitroglycerin. Pt no complaints. States "still feels fine" Pt instructed if he starts feeling bad to go to emergency room. Pt voiced understanding. To CT for Ct heart

## 2022-12-13 DIAGNOSIS — G894 Chronic pain syndrome: Secondary | ICD-10-CM | POA: Diagnosis not present

## 2022-12-13 DIAGNOSIS — M47812 Spondylosis without myelopathy or radiculopathy, cervical region: Secondary | ICD-10-CM | POA: Diagnosis not present

## 2022-12-13 DIAGNOSIS — M961 Postlaminectomy syndrome, not elsewhere classified: Secondary | ICD-10-CM | POA: Diagnosis not present

## 2022-12-15 ENCOUNTER — Telehealth: Payer: Self-pay | Admitting: *Deleted

## 2022-12-15 NOTE — Telephone Encounter (Signed)
-----   Message from Flossie Dibble sent at 12/14/2022  8:37 AM EST ----- Frank Ware, Your coronary CTA is comprised of two parts, one read by the cardiologist and one by a radiologist.  For the cardiology part, you have some disease in your coronary arteries but it is mild and not obstructing blood flow -- meaning when you have the cramping sensation, it is not related to decreased blood flow to your heart. We had increased the Imdur, if this has not helped, you can decrease the dose back to 30 mg daily.   Continue aspirin daily, your cholesterol appears to be well managed and you LPA was low -- this is all good.   If you hear nothing back after the radiologist reads their part, you can assume the remainder of the test was normal.   This is a good result!

## 2022-12-15 NOTE — Telephone Encounter (Signed)
Informed pt of CT results. He will keep his Imdur at the increased dose of 60 mg since he feels he hasn't had as much cramping since taking this. Was glad to hear the good results. Pt verbalized understanding and had no further questions.

## 2022-12-20 DIAGNOSIS — C678 Malignant neoplasm of overlapping sites of bladder: Secondary | ICD-10-CM | POA: Diagnosis not present

## 2022-12-27 ENCOUNTER — Telehealth: Payer: Self-pay | Admitting: Emergency Medicine

## 2022-12-27 NOTE — Telephone Encounter (Signed)
Results reviewed with pt as per Wallis Bamberg NP's note. Patient stated he has an appointment with his PCP next week.  Pt verbalized understanding and had no additional questions. Routed to PCP.

## 2022-12-27 NOTE — Telephone Encounter (Signed)
-----   Message from Flossie Dibble sent at 12/26/2022 10:27 AM EST ----- The second part of your coronary CTA as read by radiologist, there was a small nodule noted.  "4 mm nodule noted in right middle lobe. If patient is low risk for malignancy, no routine follow-up imaging is recommended. If patient is high risk for malignancy, a non-contrast chest CT at 12 months is optional."  Low risk could be not a smoker, no previous radiation to the chest.  This needs to be followed up with by his PCP to determine if repeat imaging is needed in 12 months.  I will forward the results to Dr. Casper Harrison.

## 2022-12-28 DIAGNOSIS — K439 Ventral hernia without obstruction or gangrene: Secondary | ICD-10-CM | POA: Diagnosis not present

## 2022-12-28 DIAGNOSIS — C678 Malignant neoplasm of overlapping sites of bladder: Secondary | ICD-10-CM | POA: Diagnosis not present

## 2022-12-28 DIAGNOSIS — C679 Malignant neoplasm of bladder, unspecified: Secondary | ICD-10-CM | POA: Diagnosis not present

## 2022-12-28 DIAGNOSIS — N3090 Cystitis, unspecified without hematuria: Secondary | ICD-10-CM | POA: Diagnosis not present

## 2022-12-30 DIAGNOSIS — E785 Hyperlipidemia, unspecified: Secondary | ICD-10-CM | POA: Diagnosis not present

## 2022-12-30 DIAGNOSIS — Z79899 Other long term (current) drug therapy: Secondary | ICD-10-CM | POA: Diagnosis not present

## 2022-12-30 DIAGNOSIS — D500 Iron deficiency anemia secondary to blood loss (chronic): Secondary | ICD-10-CM | POA: Diagnosis not present

## 2022-12-30 DIAGNOSIS — N1832 Chronic kidney disease, stage 3b: Secondary | ICD-10-CM | POA: Diagnosis not present

## 2022-12-30 DIAGNOSIS — E1169 Type 2 diabetes mellitus with other specified complication: Secondary | ICD-10-CM | POA: Diagnosis not present

## 2023-01-05 ENCOUNTER — Encounter: Payer: Self-pay | Admitting: Cardiology

## 2023-01-05 ENCOUNTER — Ambulatory Visit: Payer: Medicare Other | Attending: Cardiology | Admitting: Cardiology

## 2023-01-05 VITALS — BP 100/54 | HR 89 | Ht 66.0 in | Wt 197.2 lb

## 2023-01-05 DIAGNOSIS — I25119 Atherosclerotic heart disease of native coronary artery with unspecified angina pectoris: Secondary | ICD-10-CM | POA: Diagnosis not present

## 2023-01-05 DIAGNOSIS — E118 Type 2 diabetes mellitus with unspecified complications: Secondary | ICD-10-CM

## 2023-01-05 DIAGNOSIS — I10 Essential (primary) hypertension: Secondary | ICD-10-CM | POA: Diagnosis not present

## 2023-01-05 NOTE — Patient Instructions (Signed)
Medication Instructions:  Your physician has recommended you make the following change in your medication: Stop Amlodipine *If you need a refill on your cardiac medications before your next appointment, please call your pharmacy*   Lab Work: None If you have labs (blood work) drawn today and your tests are completely normal, you will receive your results only by: MyChart Message (if you have MyChart) OR A paper copy in the mail If you have any lab test that is abnormal or we need to change your treatment, we will call you to review the results.   Testing/Procedures: None   Follow-Up: At [facility], you and your health needs are our priority.  As part of our continuing mission to provide you with exceptional heart care, we have created designated Provider Care Teams.  These Care Teams include your primary Cardiologist (physician) and Advanced Practice Providers (APPs -  Physician Assistants and Nurse Practitioners) who all work together to provide you with the care you need, when you need it.  We recommend signing up for the patient portal called "MyChart".  Sign up information is provided on this After Visit Summary.  MyChart is used to connect with patients for Virtual Visits (Telemedicine).  Patients are able to view lab/test results, encounter notes, upcoming appointments, etc.  Non-urgent messages can be sent to your provider as well.   To learn more about what you can do with MyChart, go to [email or web address].    Your next appointment:   6 month(s)  Provider:   Gypsy Balsam, MD    Other Instructions

## 2023-01-05 NOTE — Progress Notes (Unsigned)
Cardiology Office Note:    Date:  01/05/2023   ID:  Frank Ware, DOB Mar 03, 1950, MRN [id number]  PCP:  Street, Stephanie Coup, MD  Cardiologist:  Gypsy Balsam, MD    Referring MD: Street, Stephanie Coup, *   Chief Complaint  Patient presents with   Follow-up    History of Present Illness:    Frank Ware is a 72 y.o. male past medical history significant for abnormal stress test only minimally initially preferred medical therapy but eventually decided to pursue coronary CT angio which showed only minimal disease.  His calcium score was elevated with 47% total addition problem include essential hypertension dyslipidemia.  Comes today to months for follow-up since the time of the test he does not have any more chest pain.  Denies have any issues.  Past Medical History:  Diagnosis Date   Abnormal stress test 12/03/2019   Acute pain 12/27/2018   Anginal pain (HCC)    Arthritis    Arthropathy of cervical facet joint 12/14/2016   Bilateral occipital neuralgia 10/13/2016   Bladder cancer (HCC) 08/17/2020   Body mass index 34.0-34.9, adult 09/02/2013   BPPV (benign paroxysmal positional vertigo)    Cancer (HCC)    Cervical pseudoarthrosis, sequela 02/07/2017   Cervical spinal stenosis 10/13/2016   Chronic kidney disease    Chronic pain syndrome 10/13/2016   Coronary artery disease minimal abnormal stress test 08/07/2020   Duodenal ulcer    Non-bleeding on EGD, March 2021   Dyslipidemia 11/18/2019   Essential hypertension 11/18/2019   Gastroesophageal reflux disease without esophagitis    Hiatal hernia    High cholesterol    History of COVID-19 06/2019   History of kidney stones    Hypertension    Ingrown nail 12/22/2020   Kidney stone 11/07/2019   Medication management 10/13/2016   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 08/18/2020   Pain around toenail, left foot 12/22/2020   Postlaminectomy syndrome of cervical region 10/13/2016   Precordial chest pain  11/18/2019   Presence of right artificial knee joint 10/13/2016   Primary localized osteoarthrosis, lower leg 10/13/2016   Toenail fungus 12/22/2020   Trochanteric bursitis of right hip 10/13/2016   Type 2 diabetes mellitus with complication, without long-term current use of insulin (HCC) 11/18/2019   Type 2 diabetes mellitus with hyperglycemia, without long-term current use of insulin (HCC)    Unilateral primary osteoarthritis, right knee 10/13/2016   Urothelial carcinoma of kidney, left (HCC) 11/13/2020   Vitamin D deficiency     Past Surgical History:  Procedure Laterality Date   BLADDER TUMOR EXCISION     CERVICAL FUSION     C3-C4 1992 and C5-C6 2001.   CERVICAL FUSION     x 2vc   COLONOSCOPY     CYSTOSCOPY N/A 11/13/2020   Procedure: CYSTOSCOPY;  Surgeon: Sebastian Ache, MD;  Location: [facility];  Service: Urology;  Laterality: N/A;   CYSTOSCOPY W/ RETROGRADES Right 03/12/2021   Procedure: CYSTOSCOPY WITH RETROGRADE PYELOGRAM;  Surgeon: Sebastian Ache, MD;  Location: [facility];  Service: Urology;  Laterality: Right;   CYSTOSCOPY/RETROGRADE/URETEROSCOPY Bilateral 09/25/2020   Procedure: CYSTOSCOPY/BILATERAL RETROGRADE/ RIGHT DIAGNOSTIC URETEROSCOPY/ LEFT URETERSOSCOPY WITH BIOPSY AND STENT;  Surgeon: Sebastian Ache, MD;  Location: [facility];  Service: Urology;  Laterality: Bilateral;   CYSTOSCOPY/URETEROSCOPY/HOLMIUM LASER/STENT PLACEMENT Left 11/22/2018   Procedure: CYSTOSCOPY LEFT URETEROSCOPY/HOLMIUM LASER/STENT EXCHANGE;  Surgeon: Bjorn Pippin, MD;  Location: [facility];  Service: Urology;  Laterality: Left;   CYSTOSCOPY/URETEROSCOPY/HOLMIUM LASER/STENT  PLACEMENT Left 12/25/2018   Procedure: CYSTOSCOPY LEFT RETROGRADE PYELOGRAM LEFT URETEROSCOPY WITH HOLMIUM LASER LEFT STENT EXCHANGE;  Surgeon: Bjorn Pippin, MD;  Location: [facility];  Service: Urology;  Laterality: Left;   KNEE ARTHROPLASTY Right    KNEE ARTHROSCOPY Bilateral 1985   left knee arthroscopy       LITHOTRIPSY     right kne e replacement      ROBOT ASSITED LAPAROSCOPIC NEPHROURETERECTOMY Left 11/13/2020   Procedure: XI ROBOT ASSITED LAPAROSCOPIC NEPHROURETERECTOMY;  Surgeon: Sebastian Ache, MD;  Location: [facility];  Service: Urology;  Laterality: Left;  3 HRS   TONSILLECTOMY     TRANSURETHRAL RESECTION OF BLADDER TUMOR N/A 09/25/2020   Procedure: RESTAGING TRANSURETHRAL RESECTION OF BLADDER TUMOR (TURBT);  Surgeon: Sebastian Ache, MD;  Location: [facility];  Service: Urology;  Laterality: N/A;   TRANSURETHRAL RESECTION OF BLADDER TUMOR N/A 03/12/2021   Procedure: TRANSURETHRAL RESECTION OF BLADDER TUMOR (TURBT);  Surgeon: Sebastian Ache, MD;  Location: [facility];  Service: Urology;  Laterality: N/A;  1 HR   TRANSURETHRAL RESECTION OF BLADDER TUMOR WITH MITOMYCIN-C Bilateral 08/17/2020   Procedure: TRANSURETHRAL RESECTION OF BLADDER TUMOR , CYSTOSCOPY BILATERAL RETROGRADES;  Surgeon: Bjorn Pippin, MD;  Location: [facility];  Service: Urology;  Laterality: Bilateral;   UPPER GI ENDOSCOPY      Current Medications: Current Meds  Medication Sig   acetaminophen (TYLENOL) 325 MG tablet Take 650 mg by mouth every 6 (six) hours as needed for mild pain, fever or headache.   amLODipine (NORVASC) 2.5 MG tablet Take 2.5 mg by mouth daily.   aspirin EC 81 MG tablet Take 1 tablet (81 mg total) by mouth daily. Swallow whole.   atorvastatin (LIPITOR) 20 MG tablet Take 20 mg by mouth daily.    buprenorphine (SUBUTEX) 2 MG SUBL SL tablet Place 2 mg under the tongue every 12 (twelve) hours as needed (pain).   glipiZIDE (GLUCOTROL XL) 5 MG 24 hr tablet Take 5 mg by mouth daily with breakfast.   HYDROcodone-acetaminophen (NORCO) 10-325 MG tablet Take 1 tablet by mouth every 6 (six) hours as needed for up to 30 days for Pain.(01/22/22) (Patient taking differently: Take 1 tablet by mouth every 6 (six) hours as needed for moderate pain (pain score 4-6) or severe pain (pain score 7-10).)   isosorbide mononitrate (IMDUR) 60  MG 24 hr tablet Take 1 tablet (60 mg total) by mouth daily.   metoprolol tartrate (LOPRESSOR) 50 MG tablet Take 1 tablet (50 mg total) by mouth as directed. Take 50 mg night before and 100 mg 2 hours before CT   mupirocin ointment (BACTROBAN) 2 % Apply 1 application topically daily as needed for irritation.   nitroGLYCERIN (NITROSTAT) 0.4 MG SL tablet Place 1 tablet (0.4 mg total) under the tongue every 5 (five) minutes as needed for chest pain.   olmesartan (BENICAR) 40 MG tablet Take 40 mg by mouth daily.    omeprazole (PRILOSEC) 40 MG capsule Take 40 mg by mouth daily.   ondansetron (ZOFRAN) 8 MG tablet Take 8 mg by mouth every 8 (eight) hours as needed for nausea or vomiting.   OVER THE COUNTER MEDICATION Apply 1 application topically daily as needed (neck pain). Joint Flex   polyethylene glycol (MIRALAX / GLYCOLAX) 17 g packet Take 17 g by mouth daily.   tamsulosin (FLOMAX) 0.4 MG CAPS capsule Take 0.4 mg by mouth daily.   tetrahydrozoline 0.05 % ophthalmic solution Place 1 drop into both eyes daily as needed (dry eyes).  Vitamin D, Ergocalciferol, (DRISDOL) 1.25 MG (50000 UNIT) CAPS capsule Take 50,000 Units by mouth every Tuesday.   zolpidem (AMBIEN) 10 MG tablet Take 10 mg by mouth at bedtime.     Allergies:   Patient has no known allergies.   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  Tobacco Use   Smoking status: Former    Current packs/day: 0.00    Types: Cigarettes    Quit date: 11/07/1994    Years since quitting: 28.1   Smokeless tobacco: Never  Vaping Use   Vaping status: Never Used  Substance and Sexual Activity   Alcohol use: Not Currently   Drug use: Never   Sexual activity: Yes  Other Topics Concern   Not on file  Social History Narrative    Not on file   Social Drivers of Health   Financial Resource Strain: Not on file  Food Insecurity: Not on file  Transportation Needs: Not on file  Physical Activity: Not on file  Stress: Not on file  Social Connections: Not on file     Family History: [name] family history includes Arthritis in his mother and sister; Diabetes in his maternal grandfather; Heart attack in his paternal grandfather; Hypertension in his maternal grandfather; Lymphoma in his brother; Pancreatic cancer in his brother. There is no history of Colon cancer, Colon polyps, Esophageal cancer, Rectal cancer, or Stomach cancer. ROS:   Please see the history of present illness.    All 14 point review of systems negative except as described per history of present illness  EKGs/Labs/Other Studies Reviewed:         Recent Labs: 11/24/2022: BUN 27; Creatinine, Ser 1.43; Hemoglobin 12.8; Platelets 172; Potassium 5.8; Sodium 138  Recent Lipid Panel No results found for: "CHOL", "TRIG", "HDL", "CHOLHDL", "VLDL", "LDLCALC", "LDLDIRECT"  Physical Exam:    VS:  BP (!) 100/54 (BP Location: Right Arm, Patient Position: Sitting)   Pulse 89   Ht 5\' 6"  (1.676 m)   Wt 197 lb 3.2 oz (89.4 kg)   SpO2 99%   BMI 31.83 kg/m     Wt Readings from Last 3 Encounters:  01/05/23 197 lb 3.2 oz (89.4 kg)  11/24/22 197 lb (89.4 kg)  05/27/22 193 lb 6.4 oz (87.7 kg)     GEN:  Well nourished, well developed in no acute distress HEENT: Normal NECK: No JVD; No carotid bruits LYMPHATICS: No lymphadenopathy CARDIAC: RRR, no murmurs, no rubs, no gallops RESPIRATORY:  Clear to auscultation without rales, wheezing or rhonchi  ABDOMEN: Soft, non-tender, non-distended MUSCULOSKELETAL:  No edema; No deformity  SKIN: Warm and dry LOWER EXTREMITIES: no swelling NEUROLOGIC:  Alert and oriented x 3 PSYCHIATRIC:  Normal affect   ASSESSMENT:    1. Coronary artery disease involving native coronary artery of native heart with  angina pectoris (HCC)   2. Essential hypertension   3. Type 2 diabetes mellitus with complication, without long-term current use of insulin (HCC)    PLAN:    In order of problems listed above:  Coronary disease only minimal continue antiplatelet therapy as well as statin. Dyslipidemia I did review K PN which show me total cholesterol of 91 HDL 39.  Will continue with moderate intense statin. Type 2  diabetes stable Essential hypertension: Blood pressure low will discontinue amlodipine   Medication Adjustments/Labs and Tests Ordered: Current medicines are reviewed at length with the patient today.  Concerns regarding medicines are outlined above.  No orders of the defined types were placed in this encounter.  Medication changes: No orders of the defined types were placed in this encounter.   Signed, Georgeanna Lea, MD, [facility] 01/05/2023 2:56 PM     [facility]

## 2023-01-06 DIAGNOSIS — I110 Hypertensive heart disease with heart failure: Secondary | ICD-10-CM | POA: Diagnosis not present

## 2023-01-06 DIAGNOSIS — C678 Malignant neoplasm of overlapping sites of bladder: Secondary | ICD-10-CM | POA: Diagnosis not present

## 2023-01-06 DIAGNOSIS — E782 Mixed hyperlipidemia: Secondary | ICD-10-CM | POA: Diagnosis not present

## 2023-01-06 DIAGNOSIS — I5032 Chronic diastolic (congestive) heart failure: Secondary | ICD-10-CM | POA: Diagnosis not present

## 2023-01-06 DIAGNOSIS — I25119 Atherosclerotic heart disease of native coronary artery with unspecified angina pectoris: Secondary | ICD-10-CM | POA: Diagnosis not present

## 2023-01-06 DIAGNOSIS — Z0000 Encounter for general adult medical examination without abnormal findings: Secondary | ICD-10-CM | POA: Diagnosis not present

## 2023-01-06 DIAGNOSIS — C652 Malignant neoplasm of left renal pelvis: Secondary | ICD-10-CM | POA: Diagnosis not present

## 2023-01-06 DIAGNOSIS — N138 Other obstructive and reflux uropathy: Secondary | ICD-10-CM | POA: Diagnosis not present

## 2023-01-06 DIAGNOSIS — E1121 Type 2 diabetes mellitus with diabetic nephropathy: Secondary | ICD-10-CM | POA: Diagnosis not present

## 2023-01-06 DIAGNOSIS — G4700 Insomnia, unspecified: Secondary | ICD-10-CM | POA: Diagnosis not present

## 2023-01-06 DIAGNOSIS — N1832 Chronic kidney disease, stage 3b: Secondary | ICD-10-CM | POA: Diagnosis not present

## 2023-01-12 DIAGNOSIS — Z7984 Long term (current) use of oral hypoglycemic drugs: Secondary | ICD-10-CM | POA: Diagnosis not present

## 2023-01-12 DIAGNOSIS — E119 Type 2 diabetes mellitus without complications: Secondary | ICD-10-CM | POA: Diagnosis not present

## 2023-01-12 DIAGNOSIS — B351 Tinea unguium: Secondary | ICD-10-CM | POA: Diagnosis not present

## 2023-01-17 DIAGNOSIS — C652 Malignant neoplasm of left renal pelvis: Secondary | ICD-10-CM | POA: Diagnosis not present

## 2023-01-17 DIAGNOSIS — C678 Malignant neoplasm of overlapping sites of bladder: Secondary | ICD-10-CM | POA: Diagnosis not present

## 2023-01-17 DIAGNOSIS — R3915 Urgency of urination: Secondary | ICD-10-CM | POA: Diagnosis not present

## 2023-01-26 ENCOUNTER — Other Ambulatory Visit: Payer: Self-pay | Admitting: Urology

## 2023-01-31 ENCOUNTER — Encounter (HOSPITAL_BASED_OUTPATIENT_CLINIC_OR_DEPARTMENT_OTHER): Payer: Self-pay | Admitting: Urology

## 2023-01-31 NOTE — Progress Notes (Addendum)
Spoke w/ via phone for pre-op interview--- pt Lab needs dos----   [employer] results------ current EKG in epic/ chart COVID test -----patient states asymptomatic no test needed Arrive at ------- 0530 on 02-01-2023 NPO after MN w/ exception sips of water with meds Med rec completed Medications to take morning of surgery ----- lipitor, prilosec, flomax Diabetic medication ----- do not take glipizide morning of surgery Patient instructed no nail polish to be worn day of surgery Patient instructed to bring photo id and insurance card day of surgery Patient aware to have Driver (ride ) / caregiver    for 24 hours after surgery - wife, kristi Patient Special Instructions ----- pt aware not to take subutex morning of surgery Pre-Op special Instructions -----  pt stated was given instructions from office to stop ASA,  stated last dose 01-26-2023. Patient verbalized understanding of instructions that were given at this phone interview. Patient denies chest pain, sob, fever, cough at the interview.

## 2023-02-01 ENCOUNTER — Ambulatory Visit (HOSPITAL_BASED_OUTPATIENT_CLINIC_OR_DEPARTMENT_OTHER): Payer: Medicare Other | Admitting: Anesthesiology

## 2023-02-01 ENCOUNTER — Encounter (HOSPITAL_BASED_OUTPATIENT_CLINIC_OR_DEPARTMENT_OTHER)
Admission: RE | Disposition: A | Discharge status: Home / Self Care | Payer: Self-pay | Source: Home / Self Care | Attending: Urology

## 2023-02-01 ENCOUNTER — Ambulatory Visit (HOSPITAL_BASED_OUTPATIENT_CLINIC_OR_DEPARTMENT_OTHER)
Admission: RE | Admit: 2023-02-01 | Discharge: 2023-02-01 | Disposition: A | Discharge status: Home / Self Care | Payer: Medicare Other | Attending: Urology | Admitting: Urology

## 2023-02-01 ENCOUNTER — Encounter (HOSPITAL_BASED_OUTPATIENT_CLINIC_OR_DEPARTMENT_OTHER): Payer: Self-pay | Admitting: Urology

## 2023-02-01 DIAGNOSIS — Z79899 Other long term (current) drug therapy: Secondary | ICD-10-CM | POA: Diagnosis not present

## 2023-02-01 DIAGNOSIS — N189 Chronic kidney disease, unspecified: Secondary | ICD-10-CM | POA: Diagnosis not present

## 2023-02-01 DIAGNOSIS — K219 Gastro-esophageal reflux disease without esophagitis: Secondary | ICD-10-CM | POA: Diagnosis not present

## 2023-02-01 DIAGNOSIS — M1990 Unspecified osteoarthritis, unspecified site: Secondary | ICD-10-CM | POA: Insufficient documentation

## 2023-02-01 DIAGNOSIS — C675 Malignant neoplasm of bladder neck: Secondary | ICD-10-CM | POA: Diagnosis not present

## 2023-02-01 DIAGNOSIS — K449 Diaphragmatic hernia without obstruction or gangrene: Secondary | ICD-10-CM | POA: Diagnosis not present

## 2023-02-01 DIAGNOSIS — E1122 Type 2 diabetes mellitus with diabetic chronic kidney disease: Secondary | ICD-10-CM | POA: Diagnosis not present

## 2023-02-01 DIAGNOSIS — Z7984 Long term (current) use of oral hypoglycemic drugs: Secondary | ICD-10-CM | POA: Diagnosis not present

## 2023-02-01 DIAGNOSIS — Z8249 Family history of ischemic heart disease and other diseases of the circulatory system: Secondary | ICD-10-CM | POA: Insufficient documentation

## 2023-02-01 DIAGNOSIS — Z87891 Personal history of nicotine dependence: Secondary | ICD-10-CM | POA: Diagnosis not present

## 2023-02-01 DIAGNOSIS — Z01818 Encounter for other preprocedural examination: Secondary | ICD-10-CM

## 2023-02-01 DIAGNOSIS — I129 Hypertensive chronic kidney disease with stage 1 through stage 4 chronic kidney disease, or unspecified chronic kidney disease: Secondary | ICD-10-CM | POA: Diagnosis not present

## 2023-02-01 DIAGNOSIS — Z905 Acquired absence of kidney: Secondary | ICD-10-CM | POA: Diagnosis not present

## 2023-02-01 DIAGNOSIS — N183 Chronic kidney disease, stage 3 unspecified: Secondary | ICD-10-CM | POA: Insufficient documentation

## 2023-02-01 DIAGNOSIS — I2510 Atherosclerotic heart disease of native coronary artery without angina pectoris: Secondary | ICD-10-CM

## 2023-02-01 DIAGNOSIS — I10 Essential (primary) hypertension: Secondary | ICD-10-CM

## 2023-02-01 DIAGNOSIS — G4733 Obstructive sleep apnea (adult) (pediatric): Secondary | ICD-10-CM | POA: Insufficient documentation

## 2023-02-01 DIAGNOSIS — Z833 Family history of diabetes mellitus: Secondary | ICD-10-CM | POA: Diagnosis not present

## 2023-02-01 DIAGNOSIS — C679 Malignant neoplasm of bladder, unspecified: Secondary | ICD-10-CM | POA: Diagnosis not present

## 2023-02-01 DIAGNOSIS — C678 Malignant neoplasm of overlapping sites of bladder: Secondary | ICD-10-CM | POA: Diagnosis not present

## 2023-02-01 HISTORY — DX: Chronic kidney disease, stage 3 unspecified: N18.30

## 2023-02-01 HISTORY — DX: Obstructive sleep apnea (adult) (pediatric): G47.33

## 2023-02-01 HISTORY — DX: Hyperlipidemia, unspecified: E78.5

## 2023-02-01 HISTORY — PX: TRANSURETHRAL RESECTION OF BLADDER TUMOR: SHX2575

## 2023-02-01 HISTORY — DX: Major depressive disorder, single episode, unspecified: F32.9

## 2023-02-01 HISTORY — DX: Nocturia: R35.1

## 2023-02-01 HISTORY — DX: Unspecified osteoarthritis, unspecified site: M19.90

## 2023-02-01 HISTORY — DX: Malignant neoplasm of bladder, unspecified: C67.9

## 2023-02-01 HISTORY — PX: CYSTOSCOPY: SHX5120

## 2023-02-01 HISTORY — DX: Presence of spectacles and contact lenses: Z97.3

## 2023-02-01 LAB — POCT I-STAT, CHEM 8
BUN: 36 mg/dL — ABNORMAL HIGH (ref 8–23)
Calcium, Ion: 1.36 mmol/L (ref 1.15–1.40)
Chloride: 108 mmol/L (ref 98–111)
Creatinine, Ser: 2 mg/dL — ABNORMAL HIGH (ref 0.61–1.24)
Glucose, Bld: 95 mg/dL (ref 70–99)
HCT: 36 % — ABNORMAL LOW (ref 39.0–52.0)
Hemoglobin: 12.2 g/dL — ABNORMAL LOW (ref 13.0–17.0)
Potassium: 5.2 mmol/L — ABNORMAL HIGH (ref 3.5–5.1)
Sodium: 138 mmol/L (ref 135–145)
TCO2: 20 mmol/L — ABNORMAL LOW (ref 22–32)

## 2023-02-01 LAB — GLUCOSE, CAPILLARY: Glucose-Capillary: 110 mg/dL — ABNORMAL HIGH (ref 70–99)

## 2023-02-01 SURGERY — TURBT (TRANSURETHRAL RESECTION OF BLADDER TUMOR)
Anesthesia: General | Site: Bladder | Laterality: Right

## 2023-02-01 MED ORDER — STERILE WATER FOR IRRIGATION IR SOLN
Status: DC | PRN
  Administered 2023-02-01: 500 mL

## 2023-02-01 MED ORDER — DROPERIDOL 2.5 MG/ML IJ SOLN: 0.6250 mg | Freq: Once | INTRAMUSCULAR | Status: DC | PRN

## 2023-02-01 MED ORDER — FENTANYL CITRATE (PF) 100 MCG/2ML IJ SOLN
INTRAMUSCULAR | Status: AC
  Filled 2023-02-01: qty 2

## 2023-02-01 MED ORDER — ONDANSETRON HCL 4 MG/2ML IJ SOLN
INTRAMUSCULAR | Status: DC | PRN
  Administered 2023-02-01: 4 mg via INTRAVENOUS

## 2023-02-01 MED ORDER — OXYCODONE HCL 5 MG PO TABS: 5.0000 mg | ORAL_TABLET | Freq: Once | ORAL | Status: DC | PRN

## 2023-02-01 MED ORDER — HYDROMORPHONE HCL 1 MG/ML IJ SOLN
INTRAMUSCULAR | Status: DC | PRN
  Administered 2023-02-01: 1 mg via INTRAVENOUS
  Administered 2023-02-01: .5 mg via INTRAVENOUS
  Administered 2023-02-01: 1 mg via INTRAVENOUS
  Administered 2023-02-01: .5 mg via INTRAVENOUS

## 2023-02-01 MED ORDER — DEXAMETHASONE SODIUM PHOSPHATE 10 MG/ML IJ SOLN
INTRAMUSCULAR | Status: DC | PRN
  Administered 2023-02-01: 5 mg via INTRAVENOUS

## 2023-02-01 MED ORDER — SODIUM CHLORIDE 0.9 % IV SOLN: INTRAVENOUS | Status: DC

## 2023-02-01 MED ORDER — DEXAMETHASONE SODIUM PHOSPHATE 10 MG/ML IJ SOLN
INTRAMUSCULAR | Status: AC
  Filled 2023-02-01: qty 1

## 2023-02-01 MED ORDER — HYDROMORPHONE HCL 1 MG/ML IJ SOLN
INTRAMUSCULAR | Status: AC
  Filled 2023-02-01: qty 1

## 2023-02-01 MED ORDER — ONDANSETRON HCL 4 MG/2ML IJ SOLN
INTRAMUSCULAR | Status: AC
  Filled 2023-02-01: qty 2

## 2023-02-01 MED ORDER — LIDOCAINE 2% (20 MG/ML) 5 ML SYRINGE
INTRAMUSCULAR | Status: DC | PRN
  Administered 2023-02-01: 90 mg via INTRAVENOUS

## 2023-02-01 MED ORDER — HYDROMORPHONE HCL 2 MG/ML IJ SOLN
INTRAMUSCULAR | Status: AC
  Filled 2023-02-01: qty 1

## 2023-02-01 MED ORDER — PROPOFOL 10 MG/ML IV BOLUS
INTRAVENOUS | Status: AC
  Filled 2023-02-01: qty 20

## 2023-02-01 MED ORDER — PROPOFOL 10 MG/ML IV BOLUS
INTRAVENOUS | Status: DC | PRN
  Administered 2023-02-01: 180 mg via INTRAVENOUS
  Administered 2023-02-01: 20 mg via INTRAVENOUS

## 2023-02-01 MED ORDER — OXYCODONE HCL 5 MG/5ML PO SOLN: 5.0000 mg | Freq: Once | ORAL | Status: DC | PRN

## 2023-02-01 MED ORDER — CEFAZOLIN SODIUM-DEXTROSE 2-4 GM/100ML-% IV SOLN
2.0000 g | INTRAVENOUS | Status: AC
  Administered 2023-02-01: 2 g via INTRAVENOUS

## 2023-02-01 MED ORDER — IOHEXOL 300 MG/ML  SOLN
INTRAMUSCULAR | Status: DC | PRN
  Administered 2023-02-01: 8 mL

## 2023-02-01 MED ORDER — FENTANYL CITRATE (PF) 100 MCG/2ML IJ SOLN
INTRAMUSCULAR | Status: DC | PRN
  Administered 2023-02-01 (×4): 50 ug via INTRAVENOUS

## 2023-02-01 MED ORDER — PHENYLEPHRINE 80 MCG/ML (10ML) SYRINGE FOR IV PUSH (FOR BLOOD PRESSURE SUPPORT)
PREFILLED_SYRINGE | INTRAVENOUS | Status: AC
  Filled 2023-02-01: qty 10

## 2023-02-01 MED ORDER — FENTANYL CITRATE (PF) 100 MCG/2ML IJ SOLN
25.0000 ug | INTRAMUSCULAR | Status: DC | PRN
Start: 2023-02-01 — End: 2023-02-01
  Administered 2023-02-01 (×2): 50 ug via INTRAVENOUS

## 2023-02-01 MED ORDER — HYDROMORPHONE HCL 1 MG/ML IJ SOLN: 0.2500 mg | INTRAMUSCULAR | Status: DC | PRN

## 2023-02-01 MED ORDER — PHENYLEPHRINE 80 MCG/ML (10ML) SYRINGE FOR IV PUSH (FOR BLOOD PRESSURE SUPPORT)
PREFILLED_SYRINGE | INTRAVENOUS | Status: DC | PRN
  Administered 2023-02-01: 80 ug via INTRAVENOUS
  Administered 2023-02-01 (×2): 160 ug via INTRAVENOUS

## 2023-02-01 MED ORDER — ACETAMINOPHEN 500 MG PO TABS
1000.0000 mg | ORAL_TABLET | Freq: Once | ORAL | Status: AC
  Administered 2023-02-01: 1000 mg via ORAL

## 2023-02-01 MED ORDER — SODIUM CHLORIDE 0.9 % IR SOLN
Status: DC | PRN
  Administered 2023-02-01 (×2): 3000 mL via INTRAVESICAL

## 2023-02-01 MED ORDER — CEFAZOLIN SODIUM-DEXTROSE 2-4 GM/100ML-% IV SOLN
INTRAVENOUS | Status: AC
Start: 2023-02-01 — End: ?
  Filled 2023-02-01: qty 100

## 2023-02-01 MED ORDER — LIDOCAINE HCL (PF) 2 % IJ SOLN
INTRAMUSCULAR | Status: AC
  Filled 2023-02-01: qty 5

## 2023-02-01 MED ORDER — ACETAMINOPHEN 500 MG PO TABS
ORAL_TABLET | ORAL | Status: AC
  Filled 2023-02-01: qty 2

## 2023-02-01 MED ORDER — OXYCODONE HCL 5 MG PO TABS: 5.0000 mg | ORAL_TABLET | Freq: Four times a day (QID) | ORAL | 0 refills | Status: AC | PRN

## 2023-02-01 MED ORDER — ROCURONIUM BROMIDE 10 MG/ML (PF) SYRINGE
PREFILLED_SYRINGE | INTRAVENOUS | Status: AC
  Filled 2023-02-01: qty 10

## 2023-02-01 SURGICAL SUPPLY — 23 items
BAG DRAIN URO-CYSTO SKYTR STRL (DRAIN) ×2 IMPLANT
BAG URINE DRAIN 2000ML AR STRL (UROLOGICAL SUPPLIES) IMPLANT
BAG URINE LEG 500ML (DRAIN) IMPLANT
CATH FOLEY 2WAY SLVR 30CC 20FR (CATHETERS) IMPLANT
CATH FOLEY 2WAY SLVR 5CC 22FR (CATHETERS) IMPLANT
CATH URETL OPEN END 6FR 70 (CATHETERS) IMPLANT
CLOTH BEACON ORANGE TIMEOUT ST (SAFETY) ×2 IMPLANT
ELECT REM PT RETURN 9FT ADLT (ELECTROSURGICAL)
ELECTRODE REM PT RTRN 9FT ADLT (ELECTROSURGICAL) ×2 IMPLANT
EVACUATOR MICROVAS BLADDER (UROLOGICAL SUPPLIES) IMPLANT
GLOVE BIO SURGEON STRL SZ7.5 (GLOVE) ×2 IMPLANT
GOWN STRL REUS W/TWL LRG LVL3 (GOWN DISPOSABLE) ×2 IMPLANT
GUIDEWIRE STR DUAL SENSOR (WIRE) IMPLANT
IV NS IRRIG 3000ML ARTHROMATIC (IV SOLUTION) IMPLANT
KIT TURNOVER CYSTO (KITS) ×2 IMPLANT
LOOP CUT BIPOLAR 24F LRG (ELECTROSURGICAL) IMPLANT
MANIFOLD NEPTUNE II (INSTRUMENTS) IMPLANT
PACK CYSTO (CUSTOM PROCEDURE TRAY) ×2 IMPLANT
SLEEVE SCD COMPRESS KNEE MED (STOCKING) ×2 IMPLANT
SYR TOOMEY IRRIG 70ML (MISCELLANEOUS)
SYRINGE TOOMEY IRRIG 70ML (MISCELLANEOUS) IMPLANT
TUBE CONNECTING 12X1/4 (SUCTIONS) IMPLANT
WATER STERILE IRR 500ML POUR (IV SOLUTION) IMPLANT

## 2023-02-01 NOTE — Anesthesia Postprocedure Evaluation (Signed)
Anesthesia Post Note  Patient: SAMMEY FREE  Procedure(s) Performed: TRANSURETHRAL RESECTION OF BLADDER TUMOR (TURBT) RIGHT RETROGRADE PYELOGRAM (Right) CYSTOSCOPY (Bladder)     Patient location during evaluation: PACU Anesthesia Type: General Level of consciousness: awake and alert Pain management: pain level controlled Vital Signs Assessment: post-procedure vital signs reviewed and stable Respiratory status: spontaneous breathing, nonlabored ventilation and respiratory function stable Cardiovascular status: blood pressure returned to baseline Postop Assessment: no apparent nausea or vomiting Anesthetic complications: no   No notable events documented.  Last Vitals:  Vitals:   02/01/23 0845 02/01/23 0900  BP: (!) 147/84 127/79  Pulse: 88 85  Resp: 13 12  Temp:    SpO2: 97% 94%    Last Pain:  Vitals:   02/01/23 0845  TempSrc:   PainSc: 8                  Shanda Howells

## 2023-02-01 NOTE — Op Note (Unsigned)
NAMEMAHLIK, HLADKY MEDICAL RECORD NO: [id number] ACCOUNT NO: [id number] DATE OF BIRTH: [birth year] FACILITY: [facility] LOCATION: [facility] PHYSICIAN: Sebastian Ache, MD  Operative Report   DATE OF PROCEDURE: 02/01/2023  PREOPERATIVE DIAGNOSES:  Recurrent bladder cancer, solitary right kidney.  PROCEDURE PERFORMED: 1.  Cystoscopy with right retrograde pyelogram, interpretation 2.  Transurethral resection of bladder tumor volume medium.  ESTIMATED BLOOD LOSS:  Nil.  COMPLICATION:  None.  SPECIMENS: 1.  Bladder tumor. 2.  Base of bladder tumor.  FINDINGS: 1.  Approximately 3.5 cm2 area papillary tumor mostly at the left dome and bladder neck area.  Photodocumentation performed. 2.  Unremarkable right retrograde pyelogram.  INDICATIONS:  The patient is a pleasant 73 year old man with multi-year history of multifocal urothelial carcinoma. He is status post left nephroureterectomy years ago.  He has done well with that.  Has been very compliant with surveillance.  He was  found on routine surveillance to have new papillary bladder cancer that was clinically localized.  Options were discussed including recommended path of transurethral resection for diagnostic and therapeutic intent.  He presents for this today and  informed consent was obtained and placed in the medical record.  DESCRIPTION OF PROCEDURE:   The patient being verified, procedure being transurethral resection of bladder tumor and right retrograde was confirmed.  Procedure timeout was performed.  Intravenous antibiotics administered.  General LMA anesthesia  introduced.  The patient placed into a low lithotomy position.  Sterile field was created, prepped and draped the patient's penis, perineum and proximal thigh using iodine.  Cystourethroscopy was performed using 21-French rigid cystoscope with offset  lens.  Inspection of anterior and posterior urethra was unremarkable.  Inspection of the urinary bladder revealed some  erythema and papillary tumor on the dome and left bladder neck area.  Total surface area approximately 3.5 cm.  No additional foci of  tumor were noted.  The patient has a very small blind ending left ureteral orifice. Right ureteral orifice was cannulated with 6-French end-hole catheter and the right retrograde pyelogram was obtained.  Right retrograde pyelogram demonstrated a single right ureter, single system right kidney.  No filling defects or narrowing noted. Next, cystoscope was exchanged for a 26-French resectoscope sheath with a visual obturator.  Using resectoscope loop, very  careful resection was performed of the areas of papillary tumor down to the superficial fibromuscular stroma of the urinary bladder.  These bladder tumor fragments were irrigated and set aside for permanent pathology labeled as bladder tumor.  Next,  cold-cup biopsy forceps were used to obtain representative deep seromuscular samples of the area of tumor.  These were set aside, labeled as piece of bladder tumor.  Repeat inspection following this revealed no evidence of perforation.  There was  complete resolution of all papillary tumor.  Fulguration current was used to fulgurate the base of the resection area as well as approximately 1-2 cm rim surrounding it.  I was quite happy with the completeness and safety of the procedure.  It was not  felt that any catheterization would be warranted.  The bladder was partially emptied per cystoscope. The procedure was then terminated.  The patient tolerated the procedure well.  No immediate periprocedural complications.  The patient was taken to  postanesthesia care in a stable condition.  Plan for discharge home.   [facility] D: 02/01/2023 8:13:13 am T: 02/01/2023 9:18:00 am  JOB: [id number]/ [id number]

## 2023-02-01 NOTE — Transfer of Care (Signed)
Immediate Anesthesia Transfer of Care Note  Patient: Frank Ware  Procedure(s) Performed: TRANSURETHRAL RESECTION OF BLADDER TUMOR (TURBT) RIGHT RETROGRADE PYELOGRAM (Right) CYSTOSCOPY (Bladder)  Patient Location: PACU  Anesthesia Type:General  Level of Consciousness: awake, alert , oriented, and patient cooperative  Airway & Oxygen Therapy: Patient Spontanous Breathing  Post-op Assessment: Report given to RN and Post -op Vital signs reviewed and stable  Post vital signs: Reviewed and stable  Last Vitals:  Vitals Value Taken Time  BP 158/110 02/01/23 0819  Temp 36.5 C 02/01/23 0818  Pulse 112 02/01/23 0822  Resp 21 02/01/23 0822  SpO2 98 % 02/01/23 0822  Vitals shown include unfiled device data.  Last Pain:  Vitals:   02/01/23 0600  TempSrc: Oral  PainSc: 0-No pain      Patients Stated Pain Goal: 5 (02/01/23 0600)  Complications: No notable events documented.

## 2023-02-01 NOTE — Anesthesia Procedure Notes (Signed)
Procedure Name: LMA Insertion Date/Time: 02/01/2023 7:45 AM  Performed by: Bishop Limbo, CRNAPre-anesthesia Checklist: Patient identified, Emergency Drugs available, Suction available and Patient being monitored Patient Re-evaluated:Patient Re-evaluated prior to induction Oxygen Delivery Method: Circle System Utilized Preoxygenation: Pre-oxygenation with 100% oxygen Induction Type: IV induction Ventilation: Mask ventilation without difficulty LMA: LMA inserted LMA Size: 5.0 Number of attempts: 1 Placement Confirmation: positive ETCO2 Tube secured with: Tape Dental Injury: Teeth and Oropharynx as per pre-operative assessment

## 2023-02-01 NOTE — Anesthesia Preprocedure Evaluation (Addendum)
Anesthesia Evaluation  Patient identified by MRN, date of birth, ID band Patient awake    Reviewed: Allergy & Precautions, NPO status , Patient's Chart, lab work & pertinent test results  History of Anesthesia Complications Negative for: history of anesthetic complications  Airway Mallampati: II  TM Distance: >3 FB Neck ROM: Full    Dental no notable dental hx.    Pulmonary sleep apnea , former smoker   Pulmonary exam normal        Cardiovascular hypertension, Pt. on medications (-) angina + CAD  Normal cardiovascular exam     Neuro/Psych  Headaches  Anxiety Depression       GI/Hepatic Neg liver ROS, hiatal hernia,GERD  Medicated,,  Endo/Other  diabetes, Type 2, Oral Hypoglycemic Agents    Renal/GU Renal InsufficiencyRenal disease (Cr 2.0)  negative genitourinary   Musculoskeletal  (+) Arthritis ,    Abdominal   Peds  Hematology  (+) Blood dyscrasia (Hgb 12.2), anemia   Anesthesia Other Findings Chronic pain on subutex  Reproductive/Obstetrics                             Anesthesia Physical Anesthesia Plan  ASA: 3  Anesthesia Plan: General   Post-op Pain Management: Tylenol PO (pre-op)*   Induction: Intravenous  PONV Risk Score and Plan: 2 and Treatment may vary due to age or medical condition, Ondansetron, Dexamethasone and Midazolam  Airway Management Planned: LMA  Additional Equipment: None  Intra-op Plan:   Post-operative Plan: Extubation in OR  Informed Consent: I have reviewed the patients History and Physical, chart, labs and discussed the procedure including the risks, benefits and alternatives for the proposed anesthesia with the patient or authorized representative who has indicated his/her understanding and acceptance.     Dental advisory given  Plan Discussed with: CRNA  Anesthesia Plan Comments:        Anesthesia Quick Evaluation

## 2023-02-01 NOTE — H&P (Signed)
Frank Ware is an 73 y.o. male.    Chief Complaint: Pre-Op Cysto, RT retrograde, Transurethral Resection of Bladder Tumor  HPI:   1 - Multifocal Urothelial Carcinoma / Left Renal Pelvis Cancer / High Grade Bladder Cancer - ?11/2020 - Left nephro-ureterectomy, path TaG3 with negative margins ? ?Recent Summarized Course: ? 02/2021 - T1G3 large volume bladder recurrence, retrogrades OK ==> Induction BCG x6  ?10/20224 cysto stabel abtou 5mm dome papillary changes  ?01/2023 - CT, CMP, Cysto - Cr 1.7, 1.5cm dome tumor x 2. ?  ??PMH sig for obesity, DM2, HTN, Low risk stress test (follows [facility] in [place]). His PCP is Burnis Medin MD with [facility] in [place]. He is retired from [employer], retried in [place] to be close to family. His wife Frank Ware is very involved.   ??Today "Frank Ware" is seen to proceed with cysto, Rt retrograde, TURBT for recurrent bladder cancer. Most recent UA without infectious parameters.     Past Medical History:  Diagnosis Date   Acquired solitary kidney 11/2020   right side only;   s/p  left nephroureterectomy 11-13-2020   Arthropathy of cervical facet joint 12/14/2016   Bilateral occipital neuralgia 10/13/2016   BPPV (benign paroxysmal positional vertigo)    Cervical spinal stenosis 10/13/2016   Chronic pain syndrome 10/13/2016   followed by pain clinic ---  [facility] [employer]--- Barnetta Hammersmith PA  ,  takes subutex BID and breakthough takes norco   CKD (chronic kidney disease), stage III (HCC)    Coronary artery disease minimal abnormal stress test 08/07/2020   cardiologist--- dr Bing Matter; nuclear stress test 11-27-2019  low risk , nuclear ef 64%;    coronary CT 12-12-2022  caclium score = 98.8 w/ mild non-obstructive CAD   GAD (generalized anxiety disorder)    Gastroesophageal reflux disease without esophagitis    Hiatal hernia    History of duodenal ulcer 03/2019   Non-bleeding on EGD, March 2021   History of kidney stones     History of renal pelvis cancer 11/2020   left urothelial renal cancer  s/p nephroreterectomy on 11-13-2020   Hyperlipidemia    Hypertension    MDD (major depressive disorder)    Mild obstructive sleep apnea    01-31-2023  pt stated had study done yrs ago, told mild ,  he tried cpap but felt it did not help   Nocturia more than twice per night    OA (osteoarthritis)    Postlaminectomy syndrome of cervical region 10/13/2016   Precordial chest pain 11/18/2019   Trochanteric bursitis of right hip 10/13/2016   Type 2 diabetes mellitus with hyperglycemia, without long-term current use of insulin (HCC)    followed by pcp    (01-31-2023   pt stated checks blood sugar dialy but not fasting)   Urinary bladder cancer [facility])    urologist---  dr Berneice Heinrich;     recurrent   Vitamin D deficiency    Wears glasses     Past Surgical History:  Procedure Laterality Date   CERVICAL FUSION     C3-C4 1992 and C5-C6 2001.   COLONOSCOPY  2018   CYSTOSCOPY N/A 11/13/2020   Procedure: CYSTOSCOPY;  Surgeon: Sebastian Ache, MD;  Location: [facility];  Service: Urology;  Laterality: N/A;   CYSTOSCOPY W/ RETROGRADES Right 03/12/2021   Procedure: CYSTOSCOPY WITH RETROGRADE PYELOGRAM;  Surgeon: Sebastian Ache, MD;  Location: [facility];  Service: Urology;  Laterality: Right;   CYSTOSCOPY/RETROGRADE/URETEROSCOPY Bilateral 09/25/2020  Procedure: CYSTOSCOPY/BILATERAL RETROGRADE/ RIGHT DIAGNOSTIC URETEROSCOPY/ LEFT URETERSOSCOPY WITH BIOPSY AND STENT;  Surgeon: Sebastian Ache, MD;  Location: [facility];  Service: Urology;  Laterality: Bilateral;   CYSTOSCOPY/URETEROSCOPY/HOLMIUM LASER/STENT PLACEMENT Left 11/22/2018   Procedure: CYSTOSCOPY LEFT URETEROSCOPY/HOLMIUM LASER/STENT EXCHANGE;  Surgeon: Bjorn Pippin, MD;  Location: [facility];  Service: Urology;  Laterality: Left;   CYSTOSCOPY/URETEROSCOPY/HOLMIUM LASER/STENT PLACEMENT Left 12/25/2018   Procedure: CYSTOSCOPY LEFT RETROGRADE PYELOGRAM LEFT URETEROSCOPY WITH  HOLMIUM LASER LEFT STENT EXCHANGE;  Surgeon: Bjorn Pippin, MD;  Location: [facility];  Service: Urology;  Laterality: Left;   ESOPHAGOGASTRODUODENOSCOPY (EGD) WITH PROPOFOL  04/04/2019   dr Mortimer Fries   KNEE ARTHROSCOPY Bilateral 1985   ROBOT ASSITED LAPAROSCOPIC NEPHROURETERECTOMY Left 11/13/2020   Procedure: XI ROBOT ASSITED LAPAROSCOPIC NEPHROURETERECTOMY;  Surgeon: Sebastian Ache, MD;  Location: [facility];  Service: Urology;  Laterality: Left;  3 HRS   TONSILLECTOMY  1965   TOTAL KNEE ARTHROPLASTY Right 2016   TRANSURETHRAL RESECTION OF BLADDER TUMOR N/A 09/25/2020   Procedure: RESTAGING TRANSURETHRAL RESECTION OF BLADDER TUMOR (TURBT);  Surgeon: Sebastian Ache, MD;  Location: [facility];  Service: Urology;  Laterality: N/A;   TRANSURETHRAL RESECTION OF BLADDER TUMOR N/A 03/12/2021   Procedure: TRANSURETHRAL RESECTION OF BLADDER TUMOR (TURBT);  Surgeon: Sebastian Ache, MD;  Location: [facility];  Service: Urology;  Laterality: N/A;  1 HR   TRANSURETHRAL RESECTION OF BLADDER TUMOR WITH MITOMYCIN-C Bilateral 08/17/2020   Procedure: TRANSURETHRAL RESECTION OF BLADDER TUMOR , CYSTOSCOPY BILATERAL RETROGRADES;  Surgeon: Bjorn Pippin, MD;  Location: [facility];  Service: Urology;  Laterality: Bilateral;    Family History  Problem Relation Age of Onset   Lymphoma Brother    Pancreatic cancer Brother    Diabetes Maternal Grandfather    Hypertension Maternal Grandfather    Arthritis Mother    Arthritis Sister    Heart attack Paternal Grandfather    Colon cancer Neg Hx    Colon polyps Neg Hx    Esophageal cancer Neg Hx    Rectal cancer Neg Hx    Stomach cancer Neg Hx    Social History:  reports that he quit smoking about 28 years ago. His smoking use included cigarettes. He has never used smokeless tobacco. He reports that he does not currently use alcohol. He reports that he does not use drugs.  Allergies: No Known Allergies  Medications Prior to Admission  Medication Sig Dispense  Refill   acetaminophen (TYLENOL) 500 MG tablet Take 1,000 mg by mouth every 6 (six) hours as needed.     Albuterol Sulfate, sensor, (PROAIR DIGIHALER) 108 (90 Base) MCG/ACT AEPB Inhale 2 puffs into the lungs 4 (four) times daily as needed (sob/ wheezing).     atorvastatin (LIPITOR) 20 MG tablet Take 20 mg by mouth daily.      buprenorphine (SUBUTEX) 2 MG SUBL SL tablet Place 2 mg under the tongue every 12 (twelve) hours as needed (pain).     diclofenac Sodium (VOLTAREN ARTHRITIS PAIN) 1 % GEL Apply 4 g topically 4 (four) times daily as needed.     glipiZIDE (GLUCOTROL XL) 5 MG 24 hr tablet Take 5 mg by mouth daily with breakfast.     HYDROcodone-acetaminophen (NORCO) 10-325 MG tablet Take 1 tablet by mouth every 6 (six) hours as needed for up to 30 days for Pain.(01/22/22) (Patient taking differently: Take 1 tablet by mouth every 6 (six) hours as needed for moderate pain (pain score 4-6) or severe pain (pain score 7-10).) 120 tablet 0  mupirocin ointment (BACTROBAN) 2 % Apply 1 application  topically 2 (two) times daily as needed for irritation.     naloxone (NARCAN) nasal spray 4 mg/0.1 mL Place 1 spray into the nose once.     olmesartan (BENICAR) 40 MG tablet Take 40 mg by mouth daily.      omeprazole (PRILOSEC) 40 MG capsule Take 40 mg by mouth daily.     ondansetron (ZOFRAN) 8 MG tablet Take 8 mg by mouth every 8 (eight) hours as needed for nausea or vomiting.     oxybutynin (DITROPAN) 5 MG tablet Take 5 mg by mouth 3 (three) times daily as needed for bladder spasms.     polyethylene glycol (MIRALAX / GLYCOLAX) 17 g packet Take 17 g by mouth daily as needed.     tamsulosin (FLOMAX) 0.4 MG CAPS capsule Take 0.4 mg by mouth daily.     tetrahydrozoline 0.05 % ophthalmic solution Place 1 drop into both eyes at bedtime.     Vitamin D, Ergocalciferol, (DRISDOL) 1.25 MG (50000 UNIT) CAPS capsule Take 50,000 Units by mouth every Tuesday.     zolpidem (AMBIEN) 10 MG tablet Take 10 mg by mouth at  bedtime.     aspirin EC 81 MG tablet Take 1 tablet (81 mg total) by mouth daily. Swallow whole.     nitroGLYCERIN (NITROSTAT) 0.4 MG SL tablet Place 1 tablet (0.4 mg total) under the tongue every 5 (five) minutes as needed for chest pain. 25 tablet 3    No results found for this or any previous visit (from the past 48 hours). No results found.  Review of Systems  Constitutional:  Negative for chills and fever.  All other systems reviewed and are negative.   Height 5\' 6"  (1.676 m), weight 86.2 kg. Physical Exam Vitals reviewed.  HENT:     Nose: Nose normal.  Abdominal:     General: Abdomen is flat.     Comments: Midl obesity, prior scars w/o hernias.   Genitourinary:    Comments: No CVAT at present Musculoskeletal:        General: Normal range of motion.     Cervical back: Normal range of motion.  Skin:    General: Skin is warm.  Neurological:     General: No focal deficit present.     Mental Status: He is alert.      Assessment/Plan  Proceed as planned with cysto, Rt retrograde, TURBT. RIsks, benefits, alternatives, expected peri-op course discussed previously and reiterated today.   Loletta Parish., MD 02/01/2023, 5:55 AM

## 2023-02-01 NOTE — Brief Op Note (Signed)
02/01/2023  8:08 AM  PATIENT:  Frank Ware  73 y.o. male  PRE-OPERATIVE DIAGNOSIS:  BLADDER CANCER  POST-OPERATIVE DIAGNOSIS:  * No post-op diagnosis entered *  PROCEDURE:  Procedure(s): TRANSURETHRAL RESECTION OF BLADDER TUMOR (TURBT) RIGHT RETROGRADE PYELOGRAM (Right) CYSTOSCOPY (N/A)  SURGEON:  Surgeons and Role:    * Sidra Oldfield, Delbert Phenix., MD - Primary  PHYSICIAN ASSISTANT:   ASSISTANTS: none   ANESTHESIA:   general  EBL:  minimal   BLOOD ADMINISTERED:none  DRAINS: none   LOCAL MEDICATIONS USED:  NONE  SPECIMEN:  Source of Specimen:  1 - bladder tumor; 2 - base of bladder tumor  DISPOSITION OF SPECIMEN:  PATHOLOGY  COUNTS:  YES  TOURNIQUET:  * No tourniquets in log *  DICTATION: .Other Dictation: Dictation Number [id number]  PLAN OF CARE: Discharge to home after PACU  PATIENT DISPOSITION:  PACU - hemodynamically stable.   Delay start of Pharmacological VTE agent (>24hrs) due to surgical blood loss or risk of bleeding: yes

## 2023-02-01 NOTE — Discharge Instructions (Addendum)
1 - You may have urinary urgency (bladder spasms) and bloody urine on / off for up to 2 weeks. This is normal. ? ?2 - Call MD or go to ER for fever >102, severe pain / nausea / vomiting not relieved by medications, or acute change in medical status ? ? ?Post Anesthesia Home Care Instructions ? ?Activity: ?Get plenty of rest for the remainder of the day. A responsible individual must stay with you for 24 hours following the procedure.  ?For the next 24 hours, DO NOT: ?-Drive a car ?-Paediatric nurse ?-Drink alcoholic beverages ?-Take any medication unless instructed by your physician ?-Make any legal decisions or sign important papers. ? ?Meals: ?Start with liquid foods such as gelatin or soup. Progress to regular foods as tolerated. Avoid greasy, spicy, heavy foods. If nausea and/or vomiting occur, drink only clear liquids until the nausea and/or vomiting subsides. Call your physician if vomiting continues. ? ?Special Instructions/Symptoms: ?Your throat may feel dry or sore from the anesthesia or the breathing tube placed in your throat during surgery. If this causes discomfort, gargle with warm salt water. The discomfort should disappear within 24 hours. ? ?CYSTOSCOPY HOME CARE INSTRUCTIONS ? ?Activity: ?Rest for the remainder of the day.  Do not drive or operate equipment today.  You may resume normal activities in one to two days as instructed by your physician.  ? ?Meals: ?Drink plenty of liquids and eat light foods such as gelatin or soup this evening.  You may return to a normal meal plan tomorrow. ? ?Return to Work: ?You may return to work in one to two days or as instructed by your physician. ? ?Special Instructions / Symptoms: ?Call your physician if any of these symptoms occur: ? ? -persistent or heavy bleeding ? -bleeding which continues after first few urination ? -large blood clots that are difficult to pass ? -urine stream diminishes or stops completely ? -fever equal to or higher than 101 degrees  Farenheit. ? -cloudy urine with a strong, foul odor ? -severe pain ? ?Females should always wipe from front to back after elimination.  You may feel some burning pain when you urinate.  This should disappear with time.  Applying moist heat to the lower abdomen or a hot tub bath may help relieve the pain. \ ? ?    ?

## 2023-02-02 ENCOUNTER — Encounter (HOSPITAL_BASED_OUTPATIENT_CLINIC_OR_DEPARTMENT_OTHER): Payer: Self-pay | Admitting: Urology

## 2023-02-02 LAB — SURGICAL PATHOLOGY

## 2023-02-14 DIAGNOSIS — C678 Malignant neoplasm of overlapping sites of bladder: Secondary | ICD-10-CM | POA: Diagnosis not present

## 2023-02-14 DIAGNOSIS — Z905 Acquired absence of kidney: Secondary | ICD-10-CM | POA: Diagnosis not present

## 2023-02-23 DIAGNOSIS — C649 Malignant neoplasm of unspecified kidney, except renal pelvis: Secondary | ICD-10-CM | POA: Diagnosis not present

## 2023-02-28 DIAGNOSIS — C649 Malignant neoplasm of unspecified kidney, except renal pelvis: Secondary | ICD-10-CM | POA: Diagnosis not present

## 2023-03-03 DIAGNOSIS — Z5111 Encounter for antineoplastic chemotherapy: Secondary | ICD-10-CM | POA: Diagnosis not present

## 2023-03-03 DIAGNOSIS — C678 Malignant neoplasm of overlapping sites of bladder: Secondary | ICD-10-CM | POA: Diagnosis not present

## 2023-03-07 DIAGNOSIS — C649 Malignant neoplasm of unspecified kidney, except renal pelvis: Secondary | ICD-10-CM | POA: Diagnosis not present

## 2023-03-09 DIAGNOSIS — M961 Postlaminectomy syndrome, not elsewhere classified: Secondary | ICD-10-CM | POA: Diagnosis not present

## 2023-03-09 DIAGNOSIS — Z79899 Other long term (current) drug therapy: Secondary | ICD-10-CM | POA: Diagnosis not present

## 2023-03-09 DIAGNOSIS — M47812 Spondylosis without myelopathy or radiculopathy, cervical region: Secondary | ICD-10-CM | POA: Diagnosis not present

## 2023-03-09 DIAGNOSIS — G894 Chronic pain syndrome: Secondary | ICD-10-CM | POA: Diagnosis not present

## 2023-03-10 DIAGNOSIS — R8271 Bacteriuria: Secondary | ICD-10-CM | POA: Diagnosis not present

## 2023-03-10 DIAGNOSIS — C678 Malignant neoplasm of overlapping sites of bladder: Secondary | ICD-10-CM | POA: Diagnosis not present

## 2023-03-13 DIAGNOSIS — I25119 Atherosclerotic heart disease of native coronary artery with unspecified angina pectoris: Secondary | ICD-10-CM | POA: Diagnosis not present

## 2023-03-13 DIAGNOSIS — C652 Malignant neoplasm of left renal pelvis: Secondary | ICD-10-CM | POA: Diagnosis not present

## 2023-03-13 DIAGNOSIS — D692 Other nonthrombocytopenic purpura: Secondary | ICD-10-CM | POA: Diagnosis not present

## 2023-03-13 DIAGNOSIS — I201 Angina pectoris with documented spasm: Secondary | ICD-10-CM | POA: Diagnosis not present

## 2023-03-13 DIAGNOSIS — Z905 Acquired absence of kidney: Secondary | ICD-10-CM | POA: Diagnosis not present

## 2023-03-13 DIAGNOSIS — N1832 Chronic kidney disease, stage 3b: Secondary | ICD-10-CM | POA: Diagnosis not present

## 2023-03-13 DIAGNOSIS — E1121 Type 2 diabetes mellitus with diabetic nephropathy: Secondary | ICD-10-CM | POA: Diagnosis not present

## 2023-03-13 DIAGNOSIS — I5032 Chronic diastolic (congestive) heart failure: Secondary | ICD-10-CM | POA: Diagnosis not present

## 2023-03-13 DIAGNOSIS — C678 Malignant neoplasm of overlapping sites of bladder: Secondary | ICD-10-CM | POA: Diagnosis not present

## 2023-03-13 DIAGNOSIS — I110 Hypertensive heart disease with heart failure: Secondary | ICD-10-CM | POA: Diagnosis not present

## 2023-03-14 DIAGNOSIS — C649 Malignant neoplasm of unspecified kidney, except renal pelvis: Secondary | ICD-10-CM | POA: Diagnosis not present

## 2023-03-16 DIAGNOSIS — C649 Malignant neoplasm of unspecified kidney, except renal pelvis: Secondary | ICD-10-CM | POA: Diagnosis not present

## 2023-03-16 DIAGNOSIS — Z79899 Other long term (current) drug therapy: Secondary | ICD-10-CM | POA: Diagnosis not present

## 2023-03-16 DIAGNOSIS — C679 Malignant neoplasm of bladder, unspecified: Secondary | ICD-10-CM | POA: Diagnosis not present

## 2023-03-17 DIAGNOSIS — C678 Malignant neoplasm of overlapping sites of bladder: Secondary | ICD-10-CM | POA: Diagnosis not present

## 2023-03-23 DIAGNOSIS — C679 Malignant neoplasm of bladder, unspecified: Secondary | ICD-10-CM | POA: Diagnosis not present

## 2023-03-24 DIAGNOSIS — C678 Malignant neoplasm of overlapping sites of bladder: Secondary | ICD-10-CM | POA: Diagnosis not present

## 2023-03-28 DIAGNOSIS — C679 Malignant neoplasm of bladder, unspecified: Secondary | ICD-10-CM | POA: Diagnosis not present

## 2023-03-30 DIAGNOSIS — C679 Malignant neoplasm of bladder, unspecified: Secondary | ICD-10-CM | POA: Diagnosis not present

## 2023-03-31 DIAGNOSIS — C678 Malignant neoplasm of overlapping sites of bladder: Secondary | ICD-10-CM | POA: Diagnosis not present

## 2023-04-04 DIAGNOSIS — B351 Tinea unguium: Secondary | ICD-10-CM | POA: Diagnosis not present

## 2023-04-04 DIAGNOSIS — E119 Type 2 diabetes mellitus without complications: Secondary | ICD-10-CM | POA: Diagnosis not present

## 2023-04-04 DIAGNOSIS — C679 Malignant neoplasm of bladder, unspecified: Secondary | ICD-10-CM | POA: Diagnosis not present

## 2023-04-04 DIAGNOSIS — Z7984 Long term (current) use of oral hypoglycemic drugs: Secondary | ICD-10-CM | POA: Diagnosis not present

## 2023-04-06 DIAGNOSIS — C7900 Secondary malignant neoplasm of unspecified kidney and renal pelvis: Secondary | ICD-10-CM | POA: Diagnosis not present

## 2023-04-06 DIAGNOSIS — C649 Malignant neoplasm of unspecified kidney, except renal pelvis: Secondary | ICD-10-CM | POA: Diagnosis not present

## 2023-04-07 DIAGNOSIS — C678 Malignant neoplasm of overlapping sites of bladder: Secondary | ICD-10-CM | POA: Diagnosis not present

## 2023-04-11 DIAGNOSIS — C679 Malignant neoplasm of bladder, unspecified: Secondary | ICD-10-CM | POA: Diagnosis not present

## 2023-04-24 DIAGNOSIS — M5416 Radiculopathy, lumbar region: Secondary | ICD-10-CM | POA: Diagnosis not present

## 2023-04-24 DIAGNOSIS — M9903 Segmental and somatic dysfunction of lumbar region: Secondary | ICD-10-CM | POA: Diagnosis not present

## 2023-04-24 DIAGNOSIS — M9902 Segmental and somatic dysfunction of thoracic region: Secondary | ICD-10-CM | POA: Diagnosis not present

## 2023-04-24 DIAGNOSIS — M5414 Radiculopathy, thoracic region: Secondary | ICD-10-CM | POA: Diagnosis not present

## 2023-05-09 DIAGNOSIS — M7631 Iliotibial band syndrome, right leg: Secondary | ICD-10-CM | POA: Diagnosis not present

## 2023-05-09 DIAGNOSIS — Z96651 Presence of right artificial knee joint: Secondary | ICD-10-CM | POA: Diagnosis not present

## 2023-05-26 DIAGNOSIS — I25119 Atherosclerotic heart disease of native coronary artery with unspecified angina pectoris: Secondary | ICD-10-CM | POA: Diagnosis not present

## 2023-05-26 DIAGNOSIS — N138 Other obstructive and reflux uropathy: Secondary | ICD-10-CM | POA: Diagnosis not present

## 2023-05-26 DIAGNOSIS — I5032 Chronic diastolic (congestive) heart failure: Secondary | ICD-10-CM | POA: Diagnosis not present

## 2023-05-26 DIAGNOSIS — N1832 Chronic kidney disease, stage 3b: Secondary | ICD-10-CM | POA: Diagnosis not present

## 2023-05-26 DIAGNOSIS — E1121 Type 2 diabetes mellitus with diabetic nephropathy: Secondary | ICD-10-CM | POA: Diagnosis not present

## 2023-05-26 DIAGNOSIS — I110 Hypertensive heart disease with heart failure: Secondary | ICD-10-CM | POA: Diagnosis not present

## 2023-05-26 DIAGNOSIS — C678 Malignant neoplasm of overlapping sites of bladder: Secondary | ICD-10-CM | POA: Diagnosis not present

## 2023-06-01 DIAGNOSIS — M961 Postlaminectomy syndrome, not elsewhere classified: Secondary | ICD-10-CM | POA: Diagnosis not present

## 2023-06-01 DIAGNOSIS — G894 Chronic pain syndrome: Secondary | ICD-10-CM | POA: Diagnosis not present

## 2023-06-01 DIAGNOSIS — S129XXS Fracture of neck, unspecified, sequela: Secondary | ICD-10-CM | POA: Diagnosis not present

## 2023-06-20 DIAGNOSIS — M1712 Unilateral primary osteoarthritis, left knee: Secondary | ICD-10-CM | POA: Diagnosis not present

## 2023-07-06 DIAGNOSIS — E119 Type 2 diabetes mellitus without complications: Secondary | ICD-10-CM | POA: Diagnosis not present

## 2023-07-06 DIAGNOSIS — Z7984 Long term (current) use of oral hypoglycemic drugs: Secondary | ICD-10-CM | POA: Diagnosis not present

## 2023-07-06 DIAGNOSIS — B351 Tinea unguium: Secondary | ICD-10-CM | POA: Diagnosis not present

## 2023-07-12 DIAGNOSIS — E1169 Type 2 diabetes mellitus with other specified complication: Secondary | ICD-10-CM | POA: Diagnosis not present

## 2023-07-12 DIAGNOSIS — D500 Iron deficiency anemia secondary to blood loss (chronic): Secondary | ICD-10-CM | POA: Diagnosis not present

## 2023-07-12 DIAGNOSIS — E785 Hyperlipidemia, unspecified: Secondary | ICD-10-CM | POA: Diagnosis not present

## 2023-07-12 DIAGNOSIS — N1832 Chronic kidney disease, stage 3b: Secondary | ICD-10-CM | POA: Diagnosis not present

## 2023-07-17 DIAGNOSIS — C652 Malignant neoplasm of left renal pelvis: Secondary | ICD-10-CM | POA: Diagnosis not present

## 2023-07-17 DIAGNOSIS — I5032 Chronic diastolic (congestive) heart failure: Secondary | ICD-10-CM | POA: Diagnosis not present

## 2023-07-17 DIAGNOSIS — Z1211 Encounter for screening for malignant neoplasm of colon: Secondary | ICD-10-CM | POA: Diagnosis not present

## 2023-07-17 DIAGNOSIS — I110 Hypertensive heart disease with heart failure: Secondary | ICD-10-CM | POA: Diagnosis not present

## 2023-07-17 DIAGNOSIS — C678 Malignant neoplasm of overlapping sites of bladder: Secondary | ICD-10-CM | POA: Diagnosis not present

## 2023-07-17 DIAGNOSIS — N1832 Chronic kidney disease, stage 3b: Secondary | ICD-10-CM | POA: Diagnosis not present

## 2023-07-17 DIAGNOSIS — E1121 Type 2 diabetes mellitus with diabetic nephropathy: Secondary | ICD-10-CM | POA: Diagnosis not present

## 2023-08-16 DIAGNOSIS — N1832 Chronic kidney disease, stage 3b: Secondary | ICD-10-CM | POA: Diagnosis not present

## 2023-08-25 DIAGNOSIS — L03116 Cellulitis of left lower limb: Secondary | ICD-10-CM | POA: Diagnosis not present

## 2023-08-25 DIAGNOSIS — S59912A Unspecified injury of left forearm, initial encounter: Secondary | ICD-10-CM | POA: Diagnosis not present

## 2023-09-03 DIAGNOSIS — L03124 Acute lymphangitis of left upper limb: Secondary | ICD-10-CM | POA: Diagnosis not present

## 2023-09-05 DIAGNOSIS — G894 Chronic pain syndrome: Secondary | ICD-10-CM | POA: Diagnosis not present

## 2023-09-05 DIAGNOSIS — M961 Postlaminectomy syndrome, not elsewhere classified: Secondary | ICD-10-CM | POA: Diagnosis not present

## 2023-09-05 DIAGNOSIS — Z79899 Other long term (current) drug therapy: Secondary | ICD-10-CM | POA: Diagnosis not present

## 2023-09-05 DIAGNOSIS — S129XXS Fracture of neck, unspecified, sequela: Secondary | ICD-10-CM | POA: Diagnosis not present

## 2023-09-10 ENCOUNTER — Emergency Department (HOSPITAL_COMMUNITY)

## 2023-09-10 ENCOUNTER — Other Ambulatory Visit: Payer: Self-pay

## 2023-09-10 ENCOUNTER — Encounter (HOSPITAL_COMMUNITY): Payer: Self-pay

## 2023-09-10 ENCOUNTER — Emergency Department (HOSPITAL_COMMUNITY)
Admission: EM | Admit: 2023-09-10 | Discharge: 2023-09-10 | Disposition: A | Discharge status: Home / Self Care | Attending: Emergency Medicine | Admitting: Emergency Medicine

## 2023-09-10 DIAGNOSIS — Z7982 Long term (current) use of aspirin: Secondary | ICD-10-CM | POA: Diagnosis not present

## 2023-09-10 DIAGNOSIS — R0781 Pleurodynia: Secondary | ICD-10-CM | POA: Diagnosis not present

## 2023-09-10 DIAGNOSIS — S20212A Contusion of left front wall of thorax, initial encounter: Secondary | ICD-10-CM | POA: Insufficient documentation

## 2023-09-10 DIAGNOSIS — R079 Chest pain, unspecified: Secondary | ICD-10-CM | POA: Diagnosis present

## 2023-09-10 DIAGNOSIS — W1830XA Fall on same level, unspecified, initial encounter: Secondary | ICD-10-CM | POA: Insufficient documentation

## 2023-09-10 LAB — URINALYSIS, ROUTINE W REFLEX MICROSCOPIC
Bacteria, UA: NONE SEEN
Bilirubin Urine: NEGATIVE
Glucose, UA: NEGATIVE mg/dL
Ketones, ur: NEGATIVE mg/dL
Leukocytes,Ua: NEGATIVE
Nitrite: NEGATIVE
Protein, ur: NEGATIVE mg/dL
Specific Gravity, Urine: 1.029 (ref 1.005–1.030)
pH: 5 (ref 5.0–8.0)

## 2023-09-10 LAB — COMPREHENSIVE METABOLIC PANEL WITH GFR
ALT: 24 U/L (ref 0–44)
AST: 24 U/L (ref 15–41)
Albumin: 4.6 g/dL (ref 3.5–5.0)
Alkaline Phosphatase: 48 U/L (ref 38–126)
Anion gap: 12 (ref 5–15)
BUN: 29 mg/dL — ABNORMAL HIGH (ref 8–23)
CO2: 19 mmol/L — ABNORMAL LOW (ref 22–32)
Calcium: 9.9 mg/dL (ref 8.9–10.3)
Chloride: 106 mmol/L (ref 98–111)
Creatinine, Ser: 1.45 mg/dL — ABNORMAL HIGH (ref 0.61–1.24)
GFR, Estimated: 52 mL/min — ABNORMAL LOW (ref >60)
Glucose, Bld: 159 mg/dL — ABNORMAL HIGH (ref 70–99)
Potassium: 5.1 mmol/L (ref 3.5–5.1)
Sodium: 137 mmol/L (ref 135–145)
Total Bilirubin: 1.1 mg/dL (ref 0.0–1.2)
Total Protein: 6.7 g/dL (ref 6.5–8.1)

## 2023-09-10 LAB — CBC
HCT: 41.2 % (ref 39.0–52.0)
Hemoglobin: 12.9 g/dL — ABNORMAL LOW (ref 13.0–17.0)
MCH: 33.6 pg (ref 26.0–34.0)
MCHC: 31.3 g/dL (ref 30.0–36.0)
MCV: 107.3 fL — ABNORMAL HIGH (ref 80.0–100.0)
Platelets: 162 K/uL (ref 150–400)
RBC: 3.84 MIL/uL — ABNORMAL LOW (ref 4.22–5.81)
RDW: 12.8 % (ref 11.5–15.5)
WBC: 4.8 K/uL (ref 4.0–10.5)
nRBC: 0 % (ref 0.0–0.2)

## 2023-09-10 MED ORDER — MORPHINE SULFATE (PF) 4 MG/ML IV SOLN
4.0000 mg | Freq: Once | INTRAVENOUS | Status: AC
  Administered 2023-09-10: 4 mg via INTRAVENOUS
  Filled 2023-09-10: qty 1

## 2023-09-10 NOTE — ED Notes (Signed)
 Patient transported to X-ray

## 2023-09-10 NOTE — ED Provider Notes (Signed)
 [place] [facility] AT [facility] Provider Note   CSN: [id number] Arrival date & time: 09/10/23  1203     Patient presents with: Fall (/) and Chest Pain   Frank Ware is a 73 y.o. male.   Patient to ED for evaluation of left chest pain after fall, landing the left lower chest on his bedside table this morning. He reports he fell because my knees gave out. No chest pain, recent illness. He did not hit his head. Not anticoagulated.   The history is provided by the patient. No language interpreter was used.  Fall Associated symptoms include chest pain.  Chest Pain      Prior to Admission medications   Medication Sig Start Date End Date Taking? Authorizing Provider  acetaminophen  (TYLENOL ) 500 MG tablet Take 1,000 mg by mouth every 6 (six) hours as needed.    [provider]  Albuterol  Sulfate, sensor, (PROAIR  DIGIHALER) 108 (90 Base) MCG/ACT AEPB Inhale 2 puffs into the lungs 4 (four) times daily as needed (sob/ wheezing).    [provider]  aspirin  EC 81 MG tablet Take 1 tablet (81 mg total) by mouth daily. Swallow whole. 11/25/22   Frank Delon BROCKS, NP  atorvastatin  (LIPITOR) 20 MG tablet Take 20 mg by mouth daily.     [provider]  buprenorphine  (SUBUTEX ) 2 MG SUBL SL tablet Place 2 mg under the tongue every 12 (twelve) hours as needed (pain).    [provider]  diclofenac Sodium (VOLTAREN ARTHRITIS PAIN) 1 % GEL Apply 4 g topically 4 (four) times daily as needed.    [provider]  glipiZIDE  (GLUCOTROL  XL) 5 MG 24 hr tablet Take 5 mg by mouth daily with breakfast. 09/15/22   [provider]  HYDROcodone -acetaminophen  (NORCO) 10-325 MG tablet Take 1 tablet by mouth every 6 (six) hours as needed for up to 30 days for Pain.(01/22/22) Patient taking differently: Take 1 tablet by mouth every 6 (six) hours as needed for moderate pain (pain score 4-6) or severe pain (pain score 7-10). 01/22/22      mupirocin ointment (BACTROBAN) 2 % Apply 1 application  topically 2 (two) times daily as needed for irritation. 09/02/20   [provider]  naloxone [facility]) nasal spray 4 mg/0.1 mL Place 1 spray into the nose once.    [provider]  nitroGLYCERIN  (NITROSTAT ) 0.4 MG SL tablet Place 1 tablet (0.4 mg total) under the tongue every 5 (five) minutes as needed for chest pain. 11/24/22   Frank Delon BROCKS, NP  olmesartan (BENICAR) 40 MG tablet Take 40 mg by mouth daily.     [provider]  omeprazole  (PRILOSEC) 40 MG capsule Take 40 mg by mouth daily. 12/04/19   [provider]  ondansetron  (ZOFRAN ) 8 MG tablet Take 8 mg by mouth every 8 (eight) hours as needed for nausea or vomiting.    [provider]  oxybutynin  (DITROPAN ) 5 MG tablet Take 5 mg by mouth 3 (three) times daily as needed for bladder spasms.    [provider]  oxyCODONE  (ROXICODONE ) 5 MG immediate release tablet Take 1 tablet (5 mg total) by mouth every 6 (six) hours as needed for breakthrough pain (post-operatively). 02/01/23 02/01/24  Frank Ware., MD  polyethylene glycol (MIRALAX  / GLYCOLAX ) 17 g packet Take 17 g by mouth daily as needed.    [provider]  tamsulosin (FLOMAX) 0.4 MG CAPS capsule Take 0.4 mg by mouth daily. 01/06/22  [provider]  tetrahydrozoline 0.05 % ophthalmic solution Place 1 drop into both eyes at bedtime.    [provider]  Vitamin D, Ergocalciferol, (DRISDOL) 1.25 MG (50000 UNIT) CAPS capsule Take 50,000 Units by mouth every Tuesday.    [provider]  zolpidem (AMBIEN) 10 MG tablet Take 10 mg by mouth at bedtime. 09/07/20   [provider]    Allergies: Patient has no known allergies.    Review of Systems  Cardiovascular:  Positive for chest pain.    Updated Vital Signs BP 138/86 (BP Location: Right Arm) Comment: Simultaneous filing. User may not have seen previous data. Comment (BP  Location): Simultaneous filing. User may not have seen previous data.  Pulse 86 Comment: Simultaneous filing. User may not have seen previous data.  Temp 98.6 F (37 C) (Oral) Comment: Simultaneous filing. User may not have seen previous data. Comment (Src): Simultaneous filing. User may not have seen previous data.  Resp 15 Comment: Simultaneous filing. User may not have seen previous data.  Ht 5' 6 (1.676 m)   Wt 81.6 kg   SpO2 100% Comment: Simultaneous filing. User may not have seen previous data.  BMI 29.05 kg/m   Physical Exam Constitutional:      Appearance: He is well-developed.  HENT:     Head: Normocephalic.  Cardiovascular:     Rate and Rhythm: Normal rate and regular rhythm.     Heart sounds: No murmur heard. Pulmonary:     Effort: Pulmonary effort is normal.     Breath sounds: Normal breath sounds. No wheezing, rhonchi or rales.  Chest:       Comments: Tender to left lower anterior chest wall extending to anterolateral chest near midaxillary line. There is a small bruise to lower rib border.  Abdominal:     General: Bowel sounds are normal.     Palpations: Abdomen is soft.     Tenderness: There is no abdominal tenderness (Specifically, no LUQ abdominal tenderness.). There is no guarding or rebound.  Musculoskeletal:        General: Normal range of motion.     Cervical back: Normal range of motion and neck supple.  Skin:    General: Skin is warm and dry.  Neurological:     General: No focal deficit present.     Mental Status: He is alert and oriented to person, place, and time.     (all labs ordered are listed, but only abnormal results are displayed) Labs Reviewed  COMPREHENSIVE METABOLIC PANEL WITH GFR - Abnormal; Notable for the following components:      Result Value   CO2 19 (*)    Glucose, Bld 159 (*)    BUN 29 (*)    Creatinine, Ser 1.45 (*)    GFR, Estimated 52 (*)    All other components within normal limits  CBC - Abnormal; Notable for the  following components:   RBC 3.84 (*)    Hemoglobin 12.9 (*)    MCV 107.3 (*)    All other components within normal limits  URINALYSIS, ROUTINE W REFLEX MICROSCOPIC - Abnormal; Notable for the following components:   Hgb urine dipstick MODERATE (*)    All other components within normal limits    EKG: EKG Interpretation Date/Time:  Sunday September 10 2023 12:30:39 EDT Ventricular Rate:  75 PR Interval:  208 QRS Duration:  134 QT Interval:  408 QTC Calculation: 456 R Axis:   15  Text Interpretation: Sinus rhythm Right bundle  branch block Interpretation limited secondary to artifact no stemi Confirmed by Elnor Savant ([id number]) on 09/10/2023 2:25:02 PM  Radiology: DG Ribs Unilateral W/Chest Left Result Date: 09/10/2023 CLINICAL DATA:  Fall with left rib pain. EXAM: LEFT RIBS AND CHEST - 3+ VIEW COMPARISON:  01/11/2022. FINDINGS: Frontal view of the chest shows midline trachea and normal heart size. Lungs are clear. No pleural fluid. Dedicated views of the left ribs show no definite fracture. IMPRESSION: No acute findings. Electronically Signed   By: Newell Eke M.D.   On: 09/10/2023 13:29     Procedures   Medications Ordered in the ED  morphine  (PF) 4 MG/ML injection 4 mg (4 mg Intravenous Given 09/10/23 1327)    Clinical Course as of 09/10/23 1443  Sun Sep 10, 2023  1441 Patient to ED after fall this morning, hitting his left chest on a bedside table. Breathing and moving hurts in the area since the fall. No abdominal pain, vomiting. No cough. Not anticoagulated. CXR showing no fractures, no PTX or lung injury. Labs are baseline for this patient. He has been ambulatory without recurrent sense of dizziness. He is felt stable for discharge home. He is on a Pain Mgmt contract and is not wanting any additional pain management. Incentive spirometer provided.  [SU]    Clinical Course User Index [SU] Odell Balls, PA-C                                 Medical Decision Making Amount  and/or Complexity of Data Reviewed Labs: ordered. Radiology: ordered.  Risk Prescription drug management.        Final diagnoses:  Contusion of left chest wall, initial encounter    ED Discharge Orders     None          Odell Balls, PA-C 09/10/23 1443    Elnor Savant A, DO 09/11/23 [phone]

## 2023-09-10 NOTE — ED Triage Notes (Signed)
 Patient here VIA POV after a fall this morning and hit the left side of his rib cage on the nightstand . Denies taking any blood thinners.

## 2023-09-10 NOTE — Discharge Instructions (Addendum)
 As we discussed, your tests and xrays are reassuring. You can be discharged home and should continue your regular medications for pain. Follow up with your doctor for recheck in 1-2 weeks. Use the incentive spirometer as advised for lung health.   Return to the ED With any worsening pain, or new symptom of concern.

## 2023-09-12 DIAGNOSIS — C678 Malignant neoplasm of overlapping sites of bladder: Secondary | ICD-10-CM | POA: Diagnosis not present

## 2023-09-15 DIAGNOSIS — Z1211 Encounter for screening for malignant neoplasm of colon: Secondary | ICD-10-CM | POA: Diagnosis not present

## 2023-09-15 DIAGNOSIS — C678 Malignant neoplasm of overlapping sites of bladder: Secondary | ICD-10-CM | POA: Diagnosis not present

## 2023-09-15 DIAGNOSIS — S298XXD Other specified injuries of thorax, subsequent encounter: Secondary | ICD-10-CM | POA: Diagnosis not present
